# Patient Record
Sex: Male | Born: 1970 | Hispanic: Yes | Marital: Single | Smoking: Current every day smoker
Health system: Southern US, Community
[De-identification: ages and names within clinical notes are randomized; demographics above are authoritative.]

## PROBLEM LIST (undated history)

## (undated) DIAGNOSIS — K859 Acute pancreatitis without necrosis or infection, unspecified: Secondary | ICD-10-CM

## (undated) DIAGNOSIS — R4589 Other symptoms and signs involving emotional state: Secondary | ICD-10-CM

## (undated) DIAGNOSIS — F319 Bipolar disorder, unspecified: Secondary | ICD-10-CM

## (undated) DIAGNOSIS — I81 Portal vein thrombosis: Secondary | ICD-10-CM

## (undated) DIAGNOSIS — K219 Gastro-esophageal reflux disease without esophagitis: Secondary | ICD-10-CM

## (undated) DIAGNOSIS — F4310 Post-traumatic stress disorder, unspecified: Secondary | ICD-10-CM

## (undated) DIAGNOSIS — F329 Major depressive disorder, single episode, unspecified: Secondary | ICD-10-CM

## (undated) DIAGNOSIS — R4689 Other symptoms and signs involving appearance and behavior: Secondary | ICD-10-CM

## (undated) DIAGNOSIS — I10 Essential (primary) hypertension: Secondary | ICD-10-CM

## (undated) DIAGNOSIS — E119 Type 2 diabetes mellitus without complications: Secondary | ICD-10-CM

## (undated) DIAGNOSIS — F1910 Other psychoactive substance abuse, uncomplicated: Secondary | ICD-10-CM

## (undated) DIAGNOSIS — F32A Depression, unspecified: Secondary | ICD-10-CM

## (undated) HISTORY — PX: ADENOIDECTOMY: SUR15

## (undated) HISTORY — PX: TONSILLECTOMY: SUR1361

---

## 2001-08-19 ENCOUNTER — Emergency Department (HOSPITAL_COMMUNITY): Admission: EM | Admit: 2001-08-19 | Discharge: 2001-08-19 | Payer: Self-pay | Admitting: Emergency Medicine

## 2008-12-12 ENCOUNTER — Emergency Department (HOSPITAL_COMMUNITY): Admission: EM | Admit: 2008-12-12 | Discharge: 2008-12-12 | Payer: Self-pay | Admitting: Emergency Medicine

## 2008-12-25 ENCOUNTER — Emergency Department (HOSPITAL_COMMUNITY): Admission: EM | Admit: 2008-12-25 | Discharge: 2008-12-25 | Payer: Self-pay | Admitting: Emergency Medicine

## 2009-04-24 ENCOUNTER — Emergency Department (HOSPITAL_COMMUNITY): Admission: EM | Admit: 2009-04-24 | Discharge: 2009-04-24 | Payer: Self-pay | Admitting: Emergency Medicine

## 2009-06-23 ENCOUNTER — Emergency Department (HOSPITAL_COMMUNITY): Admission: EM | Admit: 2009-06-23 | Discharge: 2009-06-23 | Payer: Self-pay | Admitting: Emergency Medicine

## 2009-06-26 ENCOUNTER — Encounter: Admission: RE | Admit: 2009-06-26 | Discharge: 2009-06-26 | Payer: Self-pay | Admitting: Family Medicine

## 2011-01-26 ENCOUNTER — Emergency Department (HOSPITAL_BASED_OUTPATIENT_CLINIC_OR_DEPARTMENT_OTHER)
Admission: EM | Admit: 2011-01-26 | Discharge: 2011-01-26 | Disposition: A | Discharge status: Home / Self Care | Payer: Medicaid Other | Attending: Emergency Medicine | Admitting: Emergency Medicine

## 2011-01-26 DIAGNOSIS — Z79899 Other long term (current) drug therapy: Secondary | ICD-10-CM | POA: Insufficient documentation

## 2011-01-26 DIAGNOSIS — F319 Bipolar disorder, unspecified: Secondary | ICD-10-CM | POA: Insufficient documentation

## 2011-01-26 DIAGNOSIS — IMO0002 Reserved for concepts with insufficient information to code with codable children: Secondary | ICD-10-CM | POA: Insufficient documentation

## 2011-01-26 DIAGNOSIS — F17200 Nicotine dependence, unspecified, uncomplicated: Secondary | ICD-10-CM | POA: Insufficient documentation

## 2011-01-26 DIAGNOSIS — F341 Dysthymic disorder: Secondary | ICD-10-CM | POA: Insufficient documentation

## 2011-01-26 DIAGNOSIS — X500XXA Overexertion from strenuous movement or load, initial encounter: Secondary | ICD-10-CM | POA: Insufficient documentation

## 2011-03-01 ENCOUNTER — Encounter: Payer: Self-pay | Admitting: Emergency Medicine

## 2011-03-01 ENCOUNTER — Ambulatory Visit (INDEPENDENT_AMBULATORY_CARE_PROVIDER_SITE_OTHER): Payer: Medicaid Other | Admitting: Emergency Medicine

## 2011-03-01 DIAGNOSIS — M549 Dorsalgia, unspecified: Secondary | ICD-10-CM

## 2011-03-04 NOTE — Letter (Signed)
Summary: CONTROLLED RX POLICY  CONTROLLED RX POLICY   Imported By: Tawni Carnes 03/01/2011 17:25:39  _____________________________________________________________________  External Attachment:    Type:   Image     Comment:   External Document

## 2011-03-04 NOTE — Assessment & Plan Note (Signed)
Summary: LOWER BACK INJURY/WSE(rm3)   Vital Signs:  Patient Profile:   40 Years Old Male CC:      lower back injury Height:     71 inches Weight:      213.8 pounds O2 Sat:      98 % O2 treatment:    Room Air Temp:     98.8 degrees F oral Pulse rate:   120 / minute Resp:     20 per minute BP sitting:   125 / 86  (left arm) Cuff size:   large  Pt. in pain?   yes    Location:   lower back    Intensity:   8    Type:       sharp  Vitals Entered By: Linton Flemings RN (March 01, 2011 5:14 PM)                   Updated Prior Medication List: * GEODON 20 MG CAPS (ZIPRASIDONE HCL) daily * DEPAKOTE 200MG  daily  Current Allergies: No known allergies History of Present Illness History from: patient Chief Complaint: lower back injury History of Present Illness: The patient presents today with back pain.  He was lifting a washing machine yesterday and felt his back pull.  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 Location: Left lower back Timing: constant Description: tightness, pulling, dull Modifying factors:  Worse with: movement, lifting, bending Better with: rest, ice, ibuprofen Trauma: no Bladder/bowel incontinence: no Weakness: no Fever/chills: no Night pain: no Unexplained weight loss: no Cancer/immunosuppression:no  PMH of osteoporosis or chronic steroid use:  no  REVIEW OF SYSTEMS Constitutional Symptoms      Denies fever, chills, night sweats, weight loss, weight gain, and fatigue.  Eyes       Denies change in vision, eye pain, eye discharge, glasses, contact lenses, and eye surgery. Ear/Nose/Throat/Mouth       Denies hearing loss/aids, change in hearing, ear pain, ear discharge, dizziness, frequent runny nose, frequent nose bleeds, sinus problems, sore throat, hoarseness, and tooth pain or bleeding.  Respiratory       Denies dry cough, productive cough, wheezing, shortness of breath, asthma, bronchitis, and emphysema/COPD.    Cardiovascular       Denies murmurs, chest pain, and tires easily with exhertion.    Gastrointestinal       Denies stomach pain, nausea/vomiting, diarrhea, constipation, blood in bowel movements, and indigestion. Genitourniary       Denies painful urination, kidney stones, and loss of urinary control. Neurological       Denies paralysis, seizures, and fainting/blackouts. Musculoskeletal       Denies muscle pain, joint pain, joint stiffness, decreased range of motion, redness, swelling, muscle weakness, and gout.  Skin       Denies bruising, unusual mles/lumps or sores, and hair/skin or nail changes.  Psych       Denies mood changes, temper/anger issues, anxiety/stress, speech problems, depression, and sleep problems. Other Comments: lower back pain, moving washer yesterday and felt a pop   Past History:  Past Medical History: PTS  Past Surgical History: tonsil cyst removal  Family History: none  Social History: smoke= 1/2 ppd alcohol-no drus-no Physical Exam General appearance: well developed, well nourished, no acute distress Head: normocephalic, atraumatic MSE: oriented to time, place, and person Left paraspinal muscle spasm and tenderness, no midline bony tenderness.  SLR negative both legs.  ROM is full but slow.  Distal NV status intact.  No bruising. Assessment New Problems: BACK PAIN (ICD-724.5)   Plan New Medications/Changes: ULTRACET 37.5-325 MG TABS (TRAMADOL-ACETAMINOPHEN) 1 by mouth q6 hrs as needed for pain  #22 x 0, 03/01/2011, Hoyt Koch MD PREDNISONE (PAK) 10 MG TABS (PREDNISONE) 6 day pack, use as directed  #1 x 0, 03/01/2011, Hoyt Koch MD FLEXERIL 10 MG TABS (CYCLOBENZAPRINE HCL) 1 by mouth three times a day as needed for spasms  #18 x 0, 03/01/2011, Hoyt Koch MD  New Orders: New Patient Level III [phone] Planning Comments:   (Looking up in the [facility] system, he receives about 10-15 Vicodins or Percocets per month over the  last year, all from different providers and using 6 addresses in the past year. ) Will Rx Flexeril, Prednisone, and ultracet.  If he needs more meds, can follow up with his PCP or back specialist.   If pain is worsening, go to ER.  Avoid lifting, pushing, pulling for a week. Encourage heating pad, massage, rest.   The patient and/or caregiver has been counseled thoroughly with regard to medications prescribed including dosage, schedule, interactions, rationale for use, and possible side effects and they verbalize understanding.  Diagnoses and expected course of recovery discussed and will return if not improved as expected or if the condition worsens. Patient and/or caregiver verbalized understanding.  Prescriptions: ULTRACET 37.5-325 MG TABS (TRAMADOL-ACETAMINOPHEN) 1 by mouth q6 hrs as needed for pain  #22 x 0   Entered and Authorized by:   Hoyt Koch MD   Signed by:   Hoyt Koch MD on 03/01/2011   Method used:   Print then Give to Patient   RxID:   [id number] PREDNISONE (PAK) 10 MG TABS (PREDNISONE) 6 day pack, use as directed  #1 x 0   Entered and Authorized by:   Hoyt Koch MD   Signed by:   Hoyt Koch MD on 03/01/2011   Method used:   Print then Give to Patient   RxID:   [id number] FLEXERIL 10 MG TABS (CYCLOBENZAPRINE HCL) 1 by mouth three times a day as needed for spasms  #18 x 0   Entered and Authorized by:   Hoyt Koch MD   Signed by:   Hoyt Koch MD on 03/01/2011   Method used:   Print then Give to Patient   RxID:   [id number]   Orders Added: 1)  New Patient Level III [[address]606]

## 2011-04-01 LAB — CULTURE, BLOOD (ROUTINE X 2): Culture: NO GROWTH

## 2011-04-21 ENCOUNTER — Emergency Department (HOSPITAL_BASED_OUTPATIENT_CLINIC_OR_DEPARTMENT_OTHER)
Admission: EM | Admit: 2011-04-21 | Discharge: 2011-04-21 | Disposition: A | Discharge status: Home / Self Care | Payer: Medicaid Other | Attending: Emergency Medicine | Admitting: Emergency Medicine

## 2011-04-21 DIAGNOSIS — K219 Gastro-esophageal reflux disease without esophagitis: Secondary | ICD-10-CM | POA: Insufficient documentation

## 2011-04-21 DIAGNOSIS — F17200 Nicotine dependence, unspecified, uncomplicated: Secondary | ICD-10-CM | POA: Insufficient documentation

## 2011-04-21 DIAGNOSIS — X58XXXA Exposure to other specified factors, initial encounter: Secondary | ICD-10-CM | POA: Insufficient documentation

## 2011-04-21 DIAGNOSIS — S335XXA Sprain of ligaments of lumbar spine, initial encounter: Secondary | ICD-10-CM | POA: Insufficient documentation

## 2011-04-21 DIAGNOSIS — Y999 Unspecified external cause status: Secondary | ICD-10-CM | POA: Insufficient documentation

## 2011-04-21 DIAGNOSIS — F319 Bipolar disorder, unspecified: Secondary | ICD-10-CM | POA: Insufficient documentation

## 2011-05-26 ENCOUNTER — Emergency Department (HOSPITAL_BASED_OUTPATIENT_CLINIC_OR_DEPARTMENT_OTHER)
Admission: EM | Admit: 2011-05-26 | Discharge: 2011-05-26 | Disposition: A | Discharge status: Home / Self Care | Payer: Medicaid Other | Attending: Emergency Medicine | Admitting: Emergency Medicine

## 2011-05-26 DIAGNOSIS — S335XXA Sprain of ligaments of lumbar spine, initial encounter: Secondary | ICD-10-CM | POA: Insufficient documentation

## 2011-05-26 DIAGNOSIS — F17200 Nicotine dependence, unspecified, uncomplicated: Secondary | ICD-10-CM | POA: Insufficient documentation

## 2011-05-26 DIAGNOSIS — Z79899 Other long term (current) drug therapy: Secondary | ICD-10-CM | POA: Insufficient documentation

## 2011-05-26 DIAGNOSIS — X500XXA Overexertion from strenuous movement or load, initial encounter: Secondary | ICD-10-CM | POA: Insufficient documentation

## 2011-05-26 DIAGNOSIS — F319 Bipolar disorder, unspecified: Secondary | ICD-10-CM | POA: Insufficient documentation

## 2011-06-23 ENCOUNTER — Emergency Department (HOSPITAL_BASED_OUTPATIENT_CLINIC_OR_DEPARTMENT_OTHER)
Admission: EM | Admit: 2011-06-23 | Discharge: 2011-06-23 | Disposition: A | Discharge status: Home / Self Care | Payer: Medicaid Other | Attending: Emergency Medicine | Admitting: Emergency Medicine

## 2011-06-23 DIAGNOSIS — F319 Bipolar disorder, unspecified: Secondary | ICD-10-CM | POA: Insufficient documentation

## 2011-06-23 DIAGNOSIS — M545 Low back pain, unspecified: Secondary | ICD-10-CM | POA: Insufficient documentation

## 2011-06-23 LAB — URINALYSIS, ROUTINE W REFLEX MICROSCOPIC
Leukocytes, UA: NEGATIVE
Nitrite: NEGATIVE
Protein, ur: NEGATIVE mg/dL
Specific Gravity, Urine: 1.017 (ref 1.005–1.030)
Urobilinogen, UA: 1 mg/dL (ref 0.0–1.0)

## 2011-07-04 ENCOUNTER — Encounter: Payer: Self-pay | Admitting: *Deleted

## 2011-07-04 ENCOUNTER — Emergency Department (HOSPITAL_BASED_OUTPATIENT_CLINIC_OR_DEPARTMENT_OTHER)
Admission: EM | Admit: 2011-07-04 | Discharge: 2011-07-05 | Disposition: A | Discharge status: Home / Self Care | Payer: Medicaid Other | Attending: Emergency Medicine | Admitting: Emergency Medicine

## 2011-07-04 DIAGNOSIS — F17200 Nicotine dependence, unspecified, uncomplicated: Secondary | ICD-10-CM | POA: Insufficient documentation

## 2011-07-04 DIAGNOSIS — Z9889 Other specified postprocedural states: Secondary | ICD-10-CM | POA: Insufficient documentation

## 2011-07-04 DIAGNOSIS — M545 Low back pain, unspecified: Secondary | ICD-10-CM | POA: Insufficient documentation

## 2011-07-04 DIAGNOSIS — IMO0001 Reserved for inherently not codable concepts without codable children: Secondary | ICD-10-CM | POA: Insufficient documentation

## 2011-07-04 DIAGNOSIS — M79609 Pain in unspecified limb: Secondary | ICD-10-CM | POA: Insufficient documentation

## 2011-07-04 DIAGNOSIS — M5430 Sciatica, unspecified side: Secondary | ICD-10-CM | POA: Insufficient documentation

## 2011-07-04 DIAGNOSIS — Y92009 Unspecified place in unspecified non-institutional (private) residence as the place of occurrence of the external cause: Secondary | ICD-10-CM | POA: Insufficient documentation

## 2011-07-04 DIAGNOSIS — K219 Gastro-esophageal reflux disease without esophagitis: Secondary | ICD-10-CM | POA: Insufficient documentation

## 2011-07-04 DIAGNOSIS — X500XXA Overexertion from strenuous movement or load, initial encounter: Secondary | ICD-10-CM | POA: Insufficient documentation

## 2011-07-04 HISTORY — DX: Gastro-esophageal reflux disease without esophagitis: K21.9

## 2011-07-04 NOTE — ED Notes (Signed)
Lower back pain began approx 13hours ago while at home. Pt was lifting boxes during the onset of pain. Pt has used heating packs and ibuprofen without relief. Last dose 8pm tonight.

## 2011-07-05 MED ORDER — CYCLOBENZAPRINE HCL 10 MG PO TABS: 10.0000 mg | ORAL_TABLET | Freq: Two times a day (BID) | ORAL | Status: AC | PRN

## 2011-07-05 MED ORDER — OXYCODONE-ACETAMINOPHEN 5-325 MG PO TABS
2.0000 | ORAL_TABLET | Freq: Once | ORAL | Status: AC
  Administered 2011-07-05: 2 via ORAL
  Filled 2011-07-05: qty 2

## 2011-07-05 MED ORDER — KETOROLAC TROMETHAMINE 60 MG/2ML IM SOLN
60.0000 mg | Freq: Once | INTRAMUSCULAR | Status: AC
  Administered 2011-07-05: 60 mg via INTRAMUSCULAR
  Filled 2011-07-05: qty 2

## 2011-07-05 MED ORDER — OXYCODONE-ACETAMINOPHEN 5-325 MG PO TABS: 1.0000 | ORAL_TABLET | ORAL | Status: AC | PRN

## 2011-07-05 MED ORDER — CYCLOBENZAPRINE HCL 10 MG PO TABS
10.0000 mg | ORAL_TABLET | Freq: Once | ORAL | Status: AC
Start: 2011-07-05 — End: 2011-07-05
  Administered 2011-07-05: 10 mg via ORAL
  Filled 2011-07-05: qty 1

## 2011-07-05 NOTE — ED Provider Notes (Signed)
History     Chief Complaint  Patient presents with  . Back Pain   HPI Comments: Patient presents with low back pain which started at approximately 10 in the morning. This occurred while he was bending over to lift up a heavy box at the time he felt a pop in his lower back.. Since that time he has had pain in his right lower back, right buttock and down his right leg. He denies numbness, weakness, tingling to that same leg. He has not had any incontinence or urinary retention. 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 This pain is worse with moving and when he shifts to the right. He describes it as a sharp pain which has been fairly constant. He has taken ibuprofen and used heat packs without any improvement  Patient is a 40 y.o. male presenting with back pain.  Back Pain  Pertinent negatives include no fever, no numbness and no weakness.    Past Medical History  Diagnosis Date  . GERD (gastroesophageal reflux disease)     Past Surgical History  Procedure Date  . Tonsillectomy   . Adenoidectomy     No family history on file.  History  Substance Use Topics  . Smoking status: Current Everyday Smoker  . Smokeless tobacco: Not on file  . Alcohol Use: Yes     occasionally      Review of Systems  Constitutional: Negative for fever and chills.  HENT: Negative for neck pain.   Gastrointestinal: Negative for nausea, vomiting and diarrhea.  Genitourinary: Negative for difficulty urinating.  Musculoskeletal: Positive for back pain.  Skin: Negative for rash.  Neurological: Negative for weakness and numbness.    Physical Exam  BP 121/89  Pulse 90  Temp(Src) 99.8 F (37.7 C) (Oral)  Resp 18  Ht 5\' 11"  (1.803 m)  Wt 220 lb (99.791 kg)  BMI 30.68 kg/m2  SpO2 99%  Physical Exam  Constitutional: He appears well-developed and well-nourished. Distressed:  Uncomfortable.  HENT:  Head: Normocephalic and  atraumatic.  Mouth/Throat: Oropharynx is clear and moist.  Eyes: Conjunctivae are normal. No scleral icterus.  Cardiovascular: Normal rate and regular rhythm.   Pulmonary/Chest: Effort normal and breath sounds normal. No respiratory distress. He has no wheezes. He has no rales.  Abdominal: Soft. Bowel sounds are normal. There is no tenderness.  Musculoskeletal: He exhibits no edema.       Mild tenderness to palpation in the right lower back. There is no central spinal tenderness to palpation  Neurological: He is alert. Coordination normal.       Normal gait, sensation, motor of his lower extremities bilaterally.  Skin: Skin is warm and dry. He is not diaphoretic.    ED Course  Procedures  MDM Patient has had increased lower back pain today related to her injury was lifting a heavy box. He has a normal neuro exam without any signs of red flags. He has been given intramuscular Toradol, oral Percocet, oral Flexeril for treatment of his pain. I have discussed with him the nature of the pain which is likely sciatica and have encouraged his followup with the appropriate specialist.      Vida Roller, MD 07/05/11 [phone]

## 2011-07-15 ENCOUNTER — Encounter (INDEPENDENT_AMBULATORY_CARE_PROVIDER_SITE_OTHER): Payer: Self-pay | Admitting: *Deleted

## 2011-07-15 ENCOUNTER — Encounter: Payer: Self-pay | Admitting: Emergency Medicine

## 2011-07-15 ENCOUNTER — Inpatient Hospital Stay (INDEPENDENT_AMBULATORY_CARE_PROVIDER_SITE_OTHER)
Admission: RE | Admit: 2011-07-15 | Discharge: 2011-07-15 | Disposition: A | Discharge status: Home / Self Care | Payer: Medicaid Other | Source: Ambulatory Visit | Attending: Emergency Medicine | Admitting: Emergency Medicine

## 2011-07-15 DIAGNOSIS — M549 Dorsalgia, unspecified: Secondary | ICD-10-CM | POA: Insufficient documentation

## 2011-07-27 ENCOUNTER — Emergency Department (HOSPITAL_COMMUNITY)
Admission: EM | Admit: 2011-07-27 | Discharge: 2011-07-27 | Disposition: A | Discharge status: Home / Self Care | Payer: Medicaid Other | Attending: Emergency Medicine | Admitting: Emergency Medicine

## 2011-07-27 DIAGNOSIS — M79609 Pain in unspecified limb: Secondary | ICD-10-CM | POA: Insufficient documentation

## 2011-07-27 DIAGNOSIS — F341 Dysthymic disorder: Secondary | ICD-10-CM | POA: Insufficient documentation

## 2011-07-27 DIAGNOSIS — M545 Low back pain, unspecified: Secondary | ICD-10-CM | POA: Insufficient documentation

## 2011-07-27 DIAGNOSIS — K219 Gastro-esophageal reflux disease without esophagitis: Secondary | ICD-10-CM | POA: Insufficient documentation

## 2011-07-27 DIAGNOSIS — F319 Bipolar disorder, unspecified: Secondary | ICD-10-CM | POA: Insufficient documentation

## 2011-11-24 NOTE — Progress Notes (Signed)
Summary: back pain rm 4   Vital Signs:  Patient Profile:   40 Years Old Male CC:      Back Pain x 1 day Height:     71 inches Weight:      218.50 pounds O2 Sat:      99 % O2 treatment:    Room Air Temp:     98.8 degrees F oral Pulse rate:   98 / minute Resp:     20 per minute BP sitting:   132 / 88  (left arm) Cuff size:   large  Pt. in pain?   yes    Location:   back    Intensity:   8    Type:       sharp  Vitals Entered By: Clemens Catholic LPN (July 15, 2011 1:48 PM)                   Updated Prior Medication List: * GEODON 20 MG CAPS (ZIPRASIDONE HCL) daily * DEPAKOTE 200MG  daily LITHIUM CARBONATE 150 MG CAPS (LITHIUM CARBONATE)  ZOLOFT 100 MG TABS (SERTRALINE HCL)  TRAZODONE HCL 100 MG TABS (TRAZODONE HCL)   Current Allergies: No known allergies History of Present Illness History from: patient Chief Complaint: Back Pain x 1 day History of Present Illness: The patient presents today with back pain for 1 day Location: R lower back Timing: constant Description: he was lifting some boxes and hurt afterwards Modifying factors: Ibu and Flexeril didn't help Worse with: movement, working as a Health visitor with: rest Trauma: no Bladder/bowel incontinence: no Weakness: no Fever/chills: no Night pain: no Unexplained weight loss: no Cancer/immunosuppression: no PMH of osteoporosis or chronic steroid use:  no  REVIEW OF SYSTEMS Constitutional Symptoms      Denies fever, chills, night sweats, weight loss, weight gain, and fatigue.  Eyes       Denies change in vision, eye pain, eye discharge, glasses, contact lenses, and eye surgery. Ear/Nose/Throat/Mouth       Denies hearing loss/aids, change in hearing, ear pain, ear discharge, dizziness, frequent runny nose, frequent nose bleeds, sinus problems, sore throat, hoarseness, and tooth pain or bleeding.  Respiratory       Denies dry cough, productive cough, wheezing, shortness of breath, asthma, bronchitis,  and emphysema/COPD.  Cardiovascular       Denies murmurs, chest pain, and tires easily with exhertion.    Gastrointestinal       Denies stomach pain, nausea/vomiting, diarrhea, constipation, blood in bowel movements, and indigestion. Genitourniary       Denies painful urination, kidney stones, and loss of urinary control. Neurological       Denies paralysis, seizures, and fainting/blackouts. Musculoskeletal       Complains of muscle pain and joint pain.      Denies joint stiffness, decreased range of motion, redness, swelling, muscle weakness, and gout.  Skin       Denies bruising, unusual mles/lumps or sores, and hair/skin or nail changes.  Psych       Denies mood changes, temper/anger issues, anxiety/stress, speech problems, depression, and sleep problems. Other Comments: pt states that he was lifting some thing at home yesterday and felt something pop in his low back. now he has pain in his low back, radiating down to his RT hip. he has taken IBF and flexeril with no relief.   Past History:  Past Medical History: PTSD bipolar  Past Surgical History: tonsil/adeniodectomy cyst removal  Family History: Reviewed history from 03/01/2011  and no changes required. none  Social History: Reviewed history from 03/01/2011 and no changes required. smoke= 1/2 ppd alcohol-no drus-no Physical Exam General appearance: well developed, well nourished, no acute distress Back: TTP R paraspinal lumbar, no spasms felt, SLR negative, no central tenderness, distal DTRs normal, distal sensation/strength/pulses normal MSE: oriented to time, place, and person Assessment New Problems: BACK PAIN (ICD-724.5)   Plan New Medications/Changes: SKELAXIN 800 MG TABS (METAXALONE) 1 by mouth up to three times a day as needed for spasms  #15 x 0, 07/15/2011, Hoyt Koch MD ULTRACET 37.5-325 MG TABS (TRAMADOL-ACETAMINOPHEN) 1 by mouth q6 hrs as needed for pain  #24 x 0, 07/15/2011, Hoyt Koch MD PREDNISONE (PAK) 10 MG TABS (PREDNISONE) 6 day pack, use as directed  #1 x 0, 07/15/2011, Hoyt Koch MD  New Orders: Est. Patient Level IV [[address]] Planning Comments:   Rest, heating pad, Rx for Skelaxin, Pred pack, and Ultracet.  Note given for work.   I don't see any red flags that would necessitate Xrays. **As with my previous note, I am quite suspicious of this patient narcotic habits.  Please see the [place] drug registry for details.  I asked him about previous back pain and prescriptions and failed to tell me about the many Rx that he's received multiple times per month. He has 3 Rx this month already from different providers and I feel he is doctor shopping and not being truthful with his history.  He also uses many different addresses.  I will not be prescribing him any narcotic pain meds.  I will however give him the names and numbers of PM&R and pain management, or he simply needs to discuss this pain with his PCP for proper referrals if the PCP feels the need to do so.    The patient and/or caregiver has been counseled thoroughly with regard to medications prescribed including dosage, schedule, interactions, rationale for use, and possible side effects and they verbalize understanding.  Diagnoses and expected course of recovery discussed and will return if not improved as expected or if the condition worsens. Patient and/or caregiver verbalized understanding.  Prescriptions: SKELAXIN 800 MG TABS (METAXALONE) 1 by mouth up to three times a day as needed for spasms  #15 x 0   Entered and Authorized by:   Hoyt Koch MD   Signed by:   Hoyt Koch MD on 07/15/2011   Method used:   Print then Give to Patient   RxID:   [id number] ULTRACET 37.5-325 MG TABS (TRAMADOL-ACETAMINOPHEN) 1 by mouth q6 hrs as needed for pain  #24 x 0   Entered and Authorized by:   Hoyt Koch MD   Signed by:   Hoyt Koch MD on 07/15/2011   Method used:   Print then Give  to Patient   RxID:   [id number] PREDNISONE (PAK) 10 MG TABS (PREDNISONE) 6 day pack, use as directed  #1 x 0   Entered and Authorized by:   Hoyt Koch MD   Signed by:   Hoyt Koch MD on 07/15/2011   Method used:   Print then Give to Patient   RxID:   [id number]   Orders Added: 1)  Est. Patient Level IV [25366]

## 2011-11-24 NOTE — Letter (Signed)
Summary: Out of Work  [facility]  [address]   [place], [place] [address]   Phone: [phone]  Fax: [phone]    July 15, 2011   Employee:  Jawuan A II Weltman    To Whom It May Concern:   For Medical reasons, please excuse the above named employee from work for the following dates:  Start:   07/15/11  Return: 07/17/11  If you need additional information, please feel free to contact our office.         Sincerely,    Clemens Catholic LPN

## 2016-01-02 ENCOUNTER — Emergency Department (HOSPITAL_BASED_OUTPATIENT_CLINIC_OR_DEPARTMENT_OTHER)
Admission: EM | Admit: 2016-01-02 | Discharge: 2016-01-02 | Disposition: A | Discharge status: Other Acute Inpatient Hospital | Payer: Medicaid Other | Attending: Physician Assistant | Admitting: Physician Assistant

## 2016-01-02 ENCOUNTER — Encounter (HOSPITAL_BASED_OUTPATIENT_CLINIC_OR_DEPARTMENT_OTHER): Payer: Self-pay | Admitting: Emergency Medicine

## 2016-01-02 ENCOUNTER — Inpatient Hospital Stay (HOSPITAL_COMMUNITY)
Admission: AD | Admit: 2016-01-02 | Discharge: 2016-01-04 | DRG: 885 | Disposition: A | Discharge status: Home / Self Care | Payer: Medicaid Other | Source: Intra-hospital | Attending: Psychiatry | Admitting: Psychiatry

## 2016-01-02 ENCOUNTER — Other Ambulatory Visit: Payer: Self-pay

## 2016-01-02 ENCOUNTER — Encounter (HOSPITAL_COMMUNITY): Payer: Self-pay

## 2016-01-02 DIAGNOSIS — Z794 Long term (current) use of insulin: Secondary | ICD-10-CM | POA: Diagnosis not present

## 2016-01-02 DIAGNOSIS — I10 Essential (primary) hypertension: Secondary | ICD-10-CM | POA: Insufficient documentation

## 2016-01-02 DIAGNOSIS — E119 Type 2 diabetes mellitus without complications: Secondary | ICD-10-CM | POA: Diagnosis present

## 2016-01-02 DIAGNOSIS — F1020 Alcohol dependence, uncomplicated: Secondary | ICD-10-CM | POA: Diagnosis present

## 2016-01-02 DIAGNOSIS — F1120 Opioid dependence, uncomplicated: Secondary | ICD-10-CM | POA: Diagnosis not present

## 2016-01-02 DIAGNOSIS — Z818 Family history of other mental and behavioral disorders: Secondary | ICD-10-CM | POA: Diagnosis not present

## 2016-01-02 DIAGNOSIS — F410 Panic disorder [episodic paroxysmal anxiety] without agoraphobia: Secondary | ICD-10-CM | POA: Diagnosis present

## 2016-01-02 DIAGNOSIS — R45851 Suicidal ideations: Secondary | ICD-10-CM

## 2016-01-02 DIAGNOSIS — F332 Major depressive disorder, recurrent severe without psychotic features: Principal | ICD-10-CM | POA: Diagnosis present

## 2016-01-02 DIAGNOSIS — F3110 Bipolar disorder, current episode manic without psychotic features, unspecified: Secondary | ICD-10-CM | POA: Diagnosis not present

## 2016-01-02 DIAGNOSIS — Z811 Family history of alcohol abuse and dependence: Secondary | ICD-10-CM | POA: Diagnosis not present

## 2016-01-02 DIAGNOSIS — F17200 Nicotine dependence, unspecified, uncomplicated: Secondary | ICD-10-CM | POA: Insufficient documentation

## 2016-01-02 DIAGNOSIS — Z79899 Other long term (current) drug therapy: Secondary | ICD-10-CM | POA: Diagnosis not present

## 2016-01-02 DIAGNOSIS — F10230 Alcohol dependence with withdrawal, uncomplicated: Secondary | ICD-10-CM | POA: Diagnosis not present

## 2016-01-02 DIAGNOSIS — F17210 Nicotine dependence, cigarettes, uncomplicated: Secondary | ICD-10-CM | POA: Diagnosis present

## 2016-01-02 DIAGNOSIS — F4310 Post-traumatic stress disorder, unspecified: Secondary | ICD-10-CM | POA: Diagnosis present

## 2016-01-02 DIAGNOSIS — G4700 Insomnia, unspecified: Secondary | ICD-10-CM | POA: Diagnosis present

## 2016-01-02 DIAGNOSIS — K219 Gastro-esophageal reflux disease without esophagitis: Secondary | ICD-10-CM | POA: Insufficient documentation

## 2016-01-02 HISTORY — DX: Other symptoms and signs involving appearance and behavior: R46.89

## 2016-01-02 HISTORY — DX: Major depressive disorder, single episode, unspecified: F32.9

## 2016-01-02 HISTORY — DX: Depression, unspecified: F32.A

## 2016-01-02 HISTORY — DX: Post-traumatic stress disorder, unspecified: F43.10

## 2016-01-02 HISTORY — DX: Portal vein thrombosis: I81

## 2016-01-02 HISTORY — DX: Essential (primary) hypertension: I10

## 2016-01-02 HISTORY — DX: Other symptoms and signs involving emotional state: R45.89

## 2016-01-02 HISTORY — DX: Other psychoactive substance abuse, uncomplicated: F19.10

## 2016-01-02 HISTORY — DX: Type 2 diabetes mellitus without complications: E11.9

## 2016-01-02 HISTORY — DX: Acute pancreatitis without necrosis or infection, unspecified: K85.90

## 2016-01-02 HISTORY — DX: Bipolar disorder, unspecified: F31.9

## 2016-01-02 LAB — CBC WITH DIFFERENTIAL/PLATELET
BASOS ABS: 0 10*3/uL (ref 0.0–0.1)
BASOS PCT: 0 %
Eosinophils Absolute: 0.1 10*3/uL (ref 0.0–0.7)
Eosinophils Relative: 1 %
HEMATOCRIT: 44.4 % (ref 39.0–52.0)
HEMOGLOBIN: 15.4 g/dL (ref 13.0–17.0)
Lymphocytes Relative: 24 %
Lymphs Abs: 2.7 10*3/uL (ref 0.7–4.0)
MCH: 28.8 pg (ref 26.0–34.0)
MCHC: 34.7 g/dL (ref 30.0–36.0)
MCV: 83.1 fL (ref 78.0–100.0)
Monocytes Absolute: 0.7 10*3/uL (ref 0.1–1.0)
Monocytes Relative: 6 %
NEUTROS ABS: 7.9 10*3/uL — AB (ref 1.7–7.7)
NEUTROS PCT: 69 %
Platelets: 240 10*3/uL (ref 150–400)
RBC: 5.34 MIL/uL (ref 4.22–5.81)
RDW: 12.1 % (ref 11.5–15.5)
WBC: 11.3 10*3/uL — AB (ref 4.0–10.5)

## 2016-01-02 LAB — COMPREHENSIVE METABOLIC PANEL
ALBUMIN: 4.3 g/dL (ref 3.5–5.0)
ALK PHOS: 95 U/L (ref 38–126)
ALT: 12 U/L — ABNORMAL LOW (ref 17–63)
AST: 18 U/L (ref 15–41)
Anion gap: 12 (ref 5–15)
BILIRUBIN TOTAL: 0.7 mg/dL (ref 0.3–1.2)
BUN: 11 mg/dL (ref 6–20)
CALCIUM: 10.1 mg/dL (ref 8.9–10.3)
CO2: 27 mmol/L (ref 22–32)
Chloride: 94 mmol/L — ABNORMAL LOW (ref 101–111)
Creatinine, Ser: 0.79 mg/dL (ref 0.61–1.24)
GFR calc Af Amer: >60 mL/min (ref >60)
GFR calc non Af Amer: >60 mL/min (ref >60)
GLUCOSE: 333 mg/dL — AB (ref 65–99)
POTASSIUM: 3.4 mmol/L — AB (ref 3.5–5.1)
Sodium: 133 mmol/L — ABNORMAL LOW (ref 135–145)
TOTAL PROTEIN: 7.4 g/dL (ref 6.5–8.1)

## 2016-01-02 LAB — RAPID URINE DRUG SCREEN, HOSP PERFORMED
AMPHETAMINES: NOT DETECTED
BARBITURATES: NOT DETECTED
Benzodiazepines: NOT DETECTED
Cocaine: POSITIVE — AB
Opiates: NOT DETECTED
Tetrahydrocannabinol: NOT DETECTED

## 2016-01-02 LAB — SALICYLATE LEVEL: Salicylate Lvl: <4 mg/dL (ref 2.8–30.0)

## 2016-01-02 LAB — ETHANOL

## 2016-01-02 LAB — ACETAMINOPHEN LEVEL

## 2016-01-02 LAB — GLUCOSE, CAPILLARY: GLUCOSE-CAPILLARY: 330 mg/dL — AB (ref 65–99)

## 2016-01-02 LAB — LIPASE, BLOOD: LIPASE: 27 U/L (ref 11–51)

## 2016-01-02 MED ORDER — INSULIN ASPART 100 UNIT/ML ~~LOC~~ SOLN
0.0000 [IU] | Freq: Three times a day (TID) | SUBCUTANEOUS | Status: DC
  Administered 2016-01-03: 11 [IU] via SUBCUTANEOUS
  Administered 2016-01-03: 15 [IU] via SUBCUTANEOUS
  Administered 2016-01-03 – 2016-01-04 (×3): 11 [IU] via SUBCUTANEOUS

## 2016-01-02 MED ORDER — ONDANSETRON 4 MG PO TBDP: 4.0000 mg | ORAL_TABLET | Freq: Four times a day (QID) | ORAL | Status: DC | PRN

## 2016-01-02 MED ORDER — SERTRALINE HCL 100 MG PO TABS
150.0000 mg | ORAL_TABLET | Freq: Every day | ORAL | Status: DC
  Administered 2016-01-03: 150 mg via ORAL
  Filled 2016-01-02 (×3): qty 1

## 2016-01-02 MED ORDER — PANTOPRAZOLE SODIUM 40 MG PO TBEC
40.0000 mg | DELAYED_RELEASE_TABLET | Freq: Two times a day (BID) | ORAL | Status: DC
  Administered 2016-01-02 – 2016-01-04 (×4): 40 mg via ORAL
  Filled 2016-01-02 (×11): qty 1

## 2016-01-02 MED ORDER — CLONIDINE HCL 0.1 MG PO TABS: 0.1000 mg | ORAL_TABLET | Freq: Every day | ORAL | Status: DC

## 2016-01-02 MED ORDER — INSULIN DETEMIR 100 UNIT/ML ~~LOC~~ SOLN
26.0000 [IU] | Freq: Every day | SUBCUTANEOUS | Status: DC
  Administered 2016-01-02 – 2016-01-03 (×2): 26 [IU] via SUBCUTANEOUS

## 2016-01-02 MED ORDER — ATORVASTATIN CALCIUM 40 MG PO TABS
40.0000 mg | ORAL_TABLET | Freq: Every day | ORAL | Status: DC
  Administered 2016-01-03 – 2016-01-04 (×2): 40 mg via ORAL
  Filled 2016-01-02 (×5): qty 1

## 2016-01-02 MED ORDER — METHOCARBAMOL 500 MG PO TABS: 500.0000 mg | ORAL_TABLET | Freq: Three times a day (TID) | ORAL | Status: DC | PRN

## 2016-01-02 MED ORDER — HALOPERIDOL 5 MG PO TABS: 5.0000 mg | ORAL_TABLET | Freq: Four times a day (QID) | ORAL | Status: DC | PRN

## 2016-01-02 MED ORDER — HYDROXYZINE HCL 25 MG PO TABS: 25.0000 mg | ORAL_TABLET | Freq: Four times a day (QID) | ORAL | Status: DC | PRN

## 2016-01-02 MED ORDER — LOPERAMIDE HCL 2 MG PO CAPS: 2.0000 mg | ORAL_CAPSULE | ORAL | Status: DC | PRN

## 2016-01-02 MED ORDER — CLONIDINE HCL 0.1 MG PO TABS
0.1000 mg | ORAL_TABLET | Freq: Four times a day (QID) | ORAL | Status: DC
  Administered 2016-01-02 – 2016-01-04 (×6): 0.1 mg via ORAL
  Filled 2016-01-02 (×13): qty 1

## 2016-01-02 MED ORDER — INSULIN ASPART 100 UNIT/ML ~~LOC~~ SOLN
0.0000 [IU] | Freq: Every day | SUBCUTANEOUS | Status: DC
  Administered 2016-01-02: 4 [IU] via SUBCUTANEOUS
  Administered 2016-01-03: 3 [IU] via SUBCUTANEOUS

## 2016-01-02 MED ORDER — TRAZODONE HCL 100 MG PO TABS
200.0000 mg | ORAL_TABLET | Freq: Every day | ORAL | Status: DC
  Administered 2016-01-02: 200 mg via ORAL
  Filled 2016-01-02 (×3): qty 2

## 2016-01-02 MED ORDER — DICYCLOMINE HCL 20 MG PO TABS: 20.0000 mg | ORAL_TABLET | Freq: Four times a day (QID) | ORAL | Status: DC | PRN

## 2016-01-02 MED ORDER — CLONIDINE HCL 0.1 MG PO TABS
0.1000 mg | ORAL_TABLET | ORAL | Status: DC
  Filled 2016-01-02 (×2): qty 1

## 2016-01-02 MED ORDER — NAPROXEN 500 MG PO TABS: 500.0000 mg | ORAL_TABLET | Freq: Two times a day (BID) | ORAL | Status: DC | PRN

## 2016-01-02 NOTE — ED Notes (Signed)
This rn phones eric in staffing office to inquire about sitter. Minerva Areolaric states he now does not have a Comptrollersitter available until 7pm. Dawn, emt to sit with pt.

## 2016-01-02 NOTE — ED Notes (Signed)
Voluntary admission paper and consent to transfer signed by pt and given to [facility] driver

## 2016-01-02 NOTE — ED Notes (Signed)
Pt belongings placed at charge nurse desk, 3 white hospital belonging bags. Pt wife verbalizes understanding of taking all 3 bags home with her.
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
## 2016-01-02 NOTE — ED Notes (Signed)
PA at bedside.

## 2016-01-02 NOTE — Progress Notes (Signed)
Patient alert and oriented x 4. Patient denies pain/SI/HI/AVH at the time of admission. Patient reports having sweats, increased anxiety, and feels like something crawling on his skin. Patient appears disheveled, and depressed. Patient was not to forth coming with information during assessment. Patient avoided eye contact with Clinical research associatewriter. Patient was cooperative during assessment.  Patient states to calm himself when upset, "I take some ativan."  Patient states, "I drink a half a fifth to a whole fifth a day." Patient states he feels like when he wakes up in the morning he has to have a drink, and if he don't starts to have withdraw symptoms. Patient states he uses "crack" but did not specify amount. Skin check preformed no areas of concern, patient has 6 tattoos over various areas of his body.

## 2016-01-02 NOTE — ED Provider Notes (Signed)
CSN: [id number]     Arrival date & time 01/02/16  1552 History   First MD Initiated Contact with Patient 01/02/16 1604     Chief Complaint  Patient presents with  . Suicidal  . Drug Problem     (Consider location/radiation/quality/duration/timing/severity/associated sxs/prior Treatment) HPI Comments: Patient presents to the emergency department with chief complaint of suicidal ideation. He states that he has felt very depressed recently. States that he tried drinking cologne, alcohol, and using crack in an attempt to overdose and die.  He states that he has felt suicidal before, but it has never been this bad. He states that he has a history of pancreatitis, and reports mild central upper abdominal pain. He denies any fevers chills. Denies any chest pain, shortness breath. Denies any nausea, vomiting, or diarrhea. He denies any other ingestions. There are no aggravating or alleviating factors.  The history is provided by the patient. No language interpreter was used.    Past Medical History  Diagnosis Date  . GERD (gastroesophageal reflux disease)   . Pancreatitis   . Diabetes mellitus without complication (HCC)   . Hypertension   . Depression   . Suicidal behavior   . Substance abuse   . PTSD (post-traumatic stress disorder)   . Bipolar 1 disorder (HCC)   . Portal vein thrombosis    Past Surgical History  Procedure Laterality Date  . Tonsillectomy    . Adenoidectomy     No family history on file. Social History  Substance Use Topics  . Smoking status: Current Every Day Smoker  . Smokeless tobacco: None  . Alcohol Use: Yes     Comment: occasionally    Review of Systems  Constitutional: Negative for fever and chills.  Respiratory: Negative for shortness of breath.   Cardiovascular: Negative for chest pain.  Gastrointestinal: Negative for nausea, vomiting, diarrhea and constipation.  Genitourinary: Negative for dysuria.  All other systems reviewed and are  negative.     Allergies  Review of patient's allergies indicates no known allergies.  Home Medications   Prior to Admission medications   Medication Sig Start Date End Date Taking? Authorizing Provider  atorvastatin (LIPITOR) 40 MG tablet Take 40 mg by mouth daily.   Yes Historical Provider, MD  haloperidol (HALDOL) 10 MG tablet Take 10 mg by mouth at bedtime.   Yes Historical Provider, MD  insulin detemir (LEVEMIR) 100 UNIT/ML injection Inject 22 Units into the skin at bedtime.   Yes Historical Provider, MD  pantoprazole (PROTONIX) 40 MG tablet Take 40 mg by mouth 2 (two) times daily.   Yes Historical Provider, MD  sertraline (ZOLOFT) 100 MG tablet Take 150 mg by mouth daily.   Yes Historical Provider, MD  traZODone (DESYREL) 100 MG tablet Take 200 mg by mouth at bedtime.   Yes Historical Provider, MD  ziprasidone (GEODON) 40 MG capsule Take 40 mg by mouth at bedtime.   Yes Historical Provider, MD   BP 136/94 mmHg  Pulse 100  Temp(Src) 98.6 F (37 C) (Oral)  Resp 18  Ht 5\' 10"  (1.778 m)  Wt 86.183 kg  BMI 27.26 kg/m2  SpO2 99% Physical Exam  Constitutional: He is oriented to person, place, and time. He appears well-developed and well-nourished.  HENT:  Head: Normocephalic and atraumatic.  Eyes: Conjunctivae and EOM are normal. Pupils are equal, round, and reactive to light. Right eye exhibits no discharge. Left eye exhibits no discharge. No scleral icterus.  Neck: Normal range of motion. Neck supple. No  JVD present.  Cardiovascular: Normal rate, regular rhythm and normal heart sounds.  Exam reveals no gallop and no friction rub.   No murmur heard. Pulmonary/Chest: Effort normal and breath sounds normal. No respiratory distress. He has no wheezes. He has no rales. He exhibits no tenderness.  Abdominal: Soft. He exhibits no distension and no mass. There is no tenderness. There is no rebound and no guarding.  Musculoskeletal: Normal range of motion. He exhibits no edema or  tenderness.  Neurological: He is alert and oriented to person, place, and time.  Skin: Skin is warm and dry.  Psychiatric: His behavior is normal. Judgment normal.  Depressed affect  Nursing note and vitals reviewed.   ED Course  Procedures (including critical care time) Results for orders placed or performed during the hospital encounter of 01/02/16  Ethanol  Result Value Ref Range   Alcohol, Ethyl (B) <5 <5 mg/dL  CBC with Differential  Result Value Ref Range   WBC 11.3 (H) 4.0 - 10.5 K/uL   RBC 5.34 4.22 - 5.81 MIL/uL   Hemoglobin 15.4 13.0 - 17.0 g/dL   HCT 69.644.4 29.539.0 - 28.452.0 %   MCV 83.1 78.0 - 100.0 fL   MCH 28.8 26.0 - 34.0 pg   MCHC 34.7 30.0 - 36.0 g/dL   RDW 13.212.1 44.011.5 - 10.215.5 %   Platelets 240 150 - 400 K/uL   Neutrophils Relative % 69 %   Neutro Abs 7.9 (H) 1.7 - 7.7 K/uL   Lymphocytes Relative 24 %   Lymphs Abs 2.7 0.7 - 4.0 K/uL   Monocytes Relative 6 %   Monocytes Absolute 0.7 0.1 - 1.0 K/uL   Eosinophils Relative 1 %   Eosinophils Absolute 0.1 0.0 - 0.7 K/uL   Basophils Relative 0 %   Basophils Absolute 0.0 0.0 - 0.1 K/uL  Comprehensive metabolic panel  Result Value Ref Range   Sodium 133 (L) 135 - 145 mmol/L   Potassium 3.4 (L) 3.5 - 5.1 mmol/L   Chloride 94 (L) 101 - 111 mmol/L   CO2 27 22 - 32 mmol/L   Glucose, Bld 333 (H) 65 - 99 mg/dL   BUN 11 6 - 20 mg/dL   Creatinine, Ser 7.250.79 0.61 - 1.24 mg/dL   Calcium 36.610.1 8.9 - 44.010.3 mg/dL   Total Protein 7.4 6.5 - 8.1 g/dL   Albumin 4.3 3.5 - 5.0 g/dL   AST 18 15 - 41 U/L   ALT 12 (L) 17 - 63 U/L   Alkaline Phosphatase 95 38 - 126 U/L   Total Bilirubin 0.7 0.3 - 1.2 mg/dL   GFR calc non Af Amer >60 >60 mL/min   GFR calc Af Amer >60 >60 mL/min   Anion gap 12 5 - 15  Urine rapid drug screen (hosp performed)  Result Value Ref Range   Opiates NONE DETECTED NONE DETECTED   Cocaine POSITIVE (A) NONE DETECTED   Benzodiazepines NONE DETECTED NONE DETECTED   Amphetamines NONE DETECTED NONE DETECTED    Tetrahydrocannabinol NONE DETECTED NONE DETECTED   Barbiturates NONE DETECTED NONE DETECTED  Lipase, blood  Result Value Ref Range   Lipase 27 11 - 51 U/L  Acetaminophen level  Result Value Ref Range   Acetaminophen (Tylenol), Serum <10 (L) 10 - 30 ug/mL  Salicylate level  Result Value Ref Range   Salicylate Lvl <4.0 2.8 - 30.0 mg/dL   No results found.    MDM   Final diagnoses:  Suicidal ideation    Patient with  suicidal ideation. He is medically clear at this time. TTS consult pending.   Roxy Horseman, PA-C 01/02/16 2019  Courteney Randall An, MD 01/02/16 2028

## 2016-01-02 NOTE — Tx Team (Signed)
Initial Interdisciplinary Treatment Plan   PATIENT STRESSORS: Financial difficulties Loss of grandmother in hospital in poor health Marital or family conflict Substance abuse   PATIENT STRENGTHS: Capable of independent living General fund of knowledge Supportive family/friends   PROBLEM LIST: Problem List/Patient Goals Date to be addressed Date deferred Reason deferred Estimated date of resolution  Substance abuse 01/02/16     Depression 01/02/16                                                DISCHARGE CRITERIA:  Ability to meet basic life and health needs Improved stabilization in mood, thinking, and/or behavior Need for constant or close observation no longer present Withdrawal symptoms are absent or subacute and managed without 24-hour nursing intervention  PRELIMINARY DISCHARGE PLAN: Attend 12-step recovery group Outpatient therapy Return to previous living arrangement  PATIENT/FAMIILY INVOLVEMENT: This treatment plan has been presented to and reviewed with the patient, David Doyle.  The patient and family have been given the opportunity to ask questions and make suggestions.  Wandra MannanKimberly R Dealva Lafoy 01/02/2016, 11:14 PM

## 2016-01-02 NOTE — ED Notes (Signed)
Per eric in staffing office, sitter in en route to ER to sit with pt.

## 2016-01-02 NOTE — ED Notes (Signed)
Per EMS:  Pt is here for treatment,  Pt states he smoked $100 worth of crack, drank cologne and is suicidal.  Pt has abdominal pain as well.  Pt has history of pancreatitis.

## 2016-01-02 NOTE — ED Notes (Signed)
Pt states he is suicidal, would take drugs to OD.  Pt agreeable to admission, states he wants help.  Pt admits to taking 1 gram of crack cocaine, drank cologne, and rubbing alcohol. Room properly staged for behavior health pt.  Pt undressed and placed in paper scrubs.  Wanded by security.  Belongings given to wife in patient belonging bags.

## 2016-01-02 NOTE — ED Notes (Signed)
Staffing office notified of need for sitter. Security staff phoned to search/wand pt.

## 2016-01-03 ENCOUNTER — Encounter (HOSPITAL_COMMUNITY): Payer: Self-pay | Admitting: Psychiatry

## 2016-01-03 DIAGNOSIS — F10230 Alcohol dependence with withdrawal, uncomplicated: Secondary | ICD-10-CM

## 2016-01-03 DIAGNOSIS — F1020 Alcohol dependence, uncomplicated: Secondary | ICD-10-CM | POA: Diagnosis present

## 2016-01-03 DIAGNOSIS — R45851 Suicidal ideations: Secondary | ICD-10-CM

## 2016-01-03 DIAGNOSIS — F332 Major depressive disorder, recurrent severe without psychotic features: Secondary | ICD-10-CM | POA: Diagnosis present

## 2016-01-03 DIAGNOSIS — F1120 Opioid dependence, uncomplicated: Secondary | ICD-10-CM

## 2016-01-03 DIAGNOSIS — F4310 Post-traumatic stress disorder, unspecified: Secondary | ICD-10-CM

## 2016-01-03 LAB — LIPID PANEL
CHOLESTEROL: 206 mg/dL — AB (ref 0–200)
HDL: 49 mg/dL (ref >40)
LDL CALC: 124 mg/dL — AB (ref 0–99)
TRIGLYCERIDES: 163 mg/dL — AB (ref <150)
Total CHOL/HDL Ratio: 4.2 RATIO
VLDL: 33 mg/dL (ref 0–40)

## 2016-01-03 LAB — GLUCOSE, CAPILLARY
GLUCOSE-CAPILLARY: 298 mg/dL — AB (ref 65–99)
GLUCOSE-CAPILLARY: 268 mg/dL — AB (ref 65–99)
Glucose-Capillary: 260 mg/dL — ABNORMAL HIGH (ref 65–99)
Glucose-Capillary: 331 mg/dL — ABNORMAL HIGH (ref 65–99)

## 2016-01-03 LAB — TSH: TSH: 0.488 u[IU]/mL (ref 0.350–4.500)

## 2016-01-03 MED ORDER — THIAMINE HCL 100 MG/ML IJ SOLN
100.0000 mg | Freq: Once | INTRAMUSCULAR | Status: AC
  Administered 2016-01-03: 100 mg via INTRAMUSCULAR
  Filled 2016-01-03: qty 2

## 2016-01-03 MED ORDER — LORAZEPAM 1 MG PO TABS: 1.0000 mg | ORAL_TABLET | Freq: Two times a day (BID) | ORAL | Status: DC

## 2016-01-03 MED ORDER — RIVAROXABAN 20 MG PO TABS
20.0000 mg | ORAL_TABLET | Freq: Every day | ORAL | Status: DC
  Administered 2016-01-03: 20 mg via ORAL
  Filled 2016-01-03 (×3): qty 1

## 2016-01-03 MED ORDER — LORAZEPAM 1 MG PO TABS
1.0000 mg | ORAL_TABLET | Freq: Four times a day (QID) | ORAL | Status: AC
  Administered 2016-01-03 (×2): 1 mg via ORAL
  Filled 2016-01-03 (×3): qty 1

## 2016-01-03 MED ORDER — VITAMIN B-1 100 MG PO TABS
100.0000 mg | ORAL_TABLET | Freq: Every day | ORAL | Status: DC
  Administered 2016-01-04: 100 mg via ORAL
  Filled 2016-01-03 (×3): qty 1

## 2016-01-03 MED ORDER — LORAZEPAM 1 MG PO TABS: 1.0000 mg | ORAL_TABLET | Freq: Four times a day (QID) | ORAL | Status: DC | PRN

## 2016-01-03 MED ORDER — PRAZOSIN HCL 2 MG PO CAPS
2.0000 mg | ORAL_CAPSULE | Freq: Every day | ORAL | Status: DC
  Administered 2016-01-03: 2 mg via ORAL
  Filled 2016-01-03 (×3): qty 1

## 2016-01-03 MED ORDER — ONDANSETRON 4 MG PO TBDP: 4.0000 mg | ORAL_TABLET | Freq: Four times a day (QID) | ORAL | Status: DC | PRN

## 2016-01-03 MED ORDER — DOXEPIN HCL 25 MG PO CAPS
25.0000 mg | ORAL_CAPSULE | Freq: Two times a day (BID) | ORAL | Status: DC
  Administered 2016-01-03 – 2016-01-04 (×2): 25 mg via ORAL
  Filled 2016-01-03 (×7): qty 1

## 2016-01-03 MED ORDER — LORAZEPAM 1 MG PO TABS
1.0000 mg | ORAL_TABLET | Freq: Three times a day (TID) | ORAL | Status: DC
  Administered 2016-01-04 (×2): 1 mg via ORAL
  Filled 2016-01-03: qty 1

## 2016-01-03 MED ORDER — LOPERAMIDE HCL 2 MG PO CAPS: 2.0000 mg | ORAL_CAPSULE | ORAL | Status: DC | PRN

## 2016-01-03 MED ORDER — LORAZEPAM 1 MG PO TABS: 1.0000 mg | ORAL_TABLET | Freq: Every day | ORAL | Status: DC

## 2016-01-03 MED ORDER — HYDROXYZINE HCL 25 MG PO TABS: 25.0000 mg | ORAL_TABLET | Freq: Four times a day (QID) | ORAL | Status: DC | PRN

## 2016-01-03 MED ORDER — ADULT MULTIVITAMIN W/MINERALS CH
1.0000 | ORAL_TABLET | Freq: Every day | ORAL | Status: DC
  Administered 2016-01-03 – 2016-01-04 (×2): 1 via ORAL
  Filled 2016-01-03 (×5): qty 1

## 2016-01-03 MED ORDER — QUETIAPINE FUMARATE 300 MG PO TABS
300.0000 mg | ORAL_TABLET | Freq: Every day | ORAL | Status: DC
  Administered 2016-01-03: 300 mg via ORAL
  Filled 2016-01-03 (×3): qty 1

## 2016-01-03 MED ORDER — NICOTINE 21 MG/24HR TD PT24
21.0000 mg | MEDICATED_PATCH | Freq: Every day | TRANSDERMAL | Status: DC
  Administered 2016-01-03 – 2016-01-04 (×2): 21 mg via TRANSDERMAL
  Filled 2016-01-03 (×5): qty 1

## 2016-01-03 NOTE — [facility] Suicide Risk Assessment (Signed)
[facility]   Nursing information obtained from:  Patient Demographic factors:  Male, Low socioeconomic status Current Mental Status:  Suicidal ideation indicated by patient Loss Factors:  Financial problems / change in socioeconomic status Historical Factors:  Prior suicide attempts Risk Reduction Factors:  Sense of responsibility to family Total Time spent with patient: 45 minutes Principal Problem: Severe recurrent major depression without psychotic features (HCC) Diagnosis:   Patient Active Problem List   Diagnosis Date Noted  . Opioid type dependence, continuous (HCC) [F11.20] 01/03/2016  . Alcohol dependence (HCC) [F10.20] 01/03/2016  . PTSD (post-traumatic stress disorder) [F43.10] 01/03/2016  . Suicidal ideation [R45.851] 01/03/2016  . Severe recurrent major depression without psychotic features (HCC) [F33.2] 01/03/2016  . BACK PAIN [M54.9] 07/15/2011     Continued Clinical Symptoms:  Alcohol Use Disorder Identification Test Final Score (AUDIT): 30 The "Alcohol Use Disorders Identification Test", Guidelines for Use in Primary Care, Second Edition.  World Science writerHealth Organization [facility](WHO). Score between 0-7:  no or low risk or alcohol related problems. Score between 8-15:  moderate risk of alcohol related problems. Score between 16-19:  high risk of alcohol related problems. Score 20 or above:  warrants further diagnostic evaluation for alcohol dependence and treatment.   CLINICAL FACTORS:   Depression:   Comorbid alcohol abuse/dependence Alcohol/Substance Abuse/Dependencies   Psychiatric Specialty Exam: Physical Exam  ROS  Blood pressure 120/75, pulse 95, temperature 99.1 F (37.3 C), temperature source Oral, resp. rate 16, height 5\' 10"  (1.778 m), weight 86.183 kg (190 lb), SpO2 100 %.Body mass index is 27.26 kg/(m^2).    COGNITIVE FEATURES THAT CONTRIBUTE TO RISK:  Closed-mindedness, Polarized thinking and Thought constriction (tunnel vision)     SUICIDE RISK:   Moderate:  Frequent suicidal ideation with limited intensity, and duration, some specificity in terms of plans, no associated intent, good self-control, limited dysphoria/symptomatology, some risk factors present, and identifiable protective factors, including available and accessible social support.  PLAN OF CARE: see admission H and P  Medical Decision Making:  Review of Psycho-Social Stressors (1), Review or order clinical lab tests (1), Review of Medication Regimen & Side Effects (2) and Review of New Medication or Change in Dosage (2)  I certify that inpatient services furnished can reasonably be expected to improve the patient's condition.   David Doyle A 01/03/2016, 4:47 PM

## 2016-01-03 NOTE — Progress Notes (Signed)
Adult Psychoeducational Group Note  Date:  01/03/2016 Time:  10:35 PM  Group Topic/Focus:  Wrap-Up Group:   The focus of this group is to help patients review their daily goal of treatment and discuss progress on daily workbooks.  Participation Level:  Did Not Attend  Participation Quality:  Did not Attend  Affect:  Did not attend  Cognitive:  Did not attend  Insight: None  Engagement in Group:  Did not attend  Modes of Intervention:  Did not attend  Additional Comments:  Patient did not attend wrap up group tonight.   Ashlye Oviedo L Rafferty Postlewait 01/03/2016, 10:35 PM

## 2016-01-03 NOTE — Tx Team (Addendum)
Interdisciplinary Treatment Plan Update (Adult) Date: 01/03/2016    Time Reviewed: 9:30 AM  Progress in Treatment: Attending groups: Continuing to assess, patient new to milieu Participating in groups: Continuing to assess, patient new to milieu Taking medication as prescribed: Yes Tolerating medication: Yes Family/Significant other contact made: No, patient does not have a working contact number for his wife Patient understands diagnosis: Yes Discussing patient identified problems/goals with staff: Yes Medical problems stabilized or resolved: Yes Denies suicidal/homicidal ideation: Yes, denies Issues/concerns per patient self-inventory: Yes Other:  New problem(s) identified: N/A  Discharge Plan or Barriers: Patient plans to return home with family in [place] to follow up with outpatient services at [facility].  Reason for Continuation of Hospitalization:  Depression Anxiety Medication Stabilization   Comments: N/A  Estimated length of stay: Discharge anticipated for 01/04/16    Patient is a 45 year old male admitted for substance abuse and SI with attempts to harm self by drinking cologne. Patient will benefit from crisis stabilization, medication evaluation, group therapy and psycho education in addition to case management for discharge planning. At discharge, it is recommended that Pt remain compliant with established discharge plan and continued treatment.   Review of initial/current patient goals per problem list:  1. Goal(s): Patient will participate in aftercare plan   Met: Yes   Target date: 3-5 days post admission date   As evidenced by: Patient will participate within aftercare plan AEB aftercare provider and housing plan at discharge being identified.  1/12: Goal not met: CSW assessing for appropriate referrals for pt and will have follow up secured prior to d/c.  1/13: Goal met. Patient plans to return home with outpatient services.   2. Goal (s): Patient will  exhibit decreased depressive symptoms and suicidal ideations.   Met: Adequate for discharge per MD   Target date: 3-5 days post admission date   As evidenced by: Patient will utilize self rating of depression at 3 or below and demonstrate decreased signs of depression or be deemed stable for discharge by MD.  1/12: Goal not met: Pt presents with flat affect and depressed mood.  Pt admitted with depression rating of 10.  Pt to show decreased sign of depression and a rating of 3 or less before d/c.    1/13: Adequate for discharge per MD. Patient reports baseline levels of depression and denies SI. Reports feeling safe to discharge with outpatient services.      4. Goal(s): Patient will demonstrate decreased signs of withdrawal due to substance abuse   Met: Adequate for discharge per MD   Target date: 3-5 days post admission date   As evidenced by: Patient will produce a CIWA/COWS score of 0, have stable vitals signs, and no symptoms of withdrawal  1/12: Goal not met: Pt continues to have withdrawal symptoms of anxiety, tremor, runny nose, resting pulse rate and a COWS score of a 9.  Pt to show decrease withdrawal symptoms prior to d/c.  1/13: Adequate for discharge. Patient rates COWS at 2, experiencing body aches and restlessness.      Attendees: Patient:    Family:    Physician: Dr. Parke Poisson; Dr. Sabra Heck 01/03/2016 9:30 AM  Nursing:  Grayland Ormond, Janann August, Malena Catholic., RN 01/03/2016 9:30 AM  Clinical Social Worker: Tilden Fossa, [place] 01/03/2016 9:30 AM  Other: Peri Maris, LCSWA; [place], LCSW  01/03/2016 9:30 AM  Other:  01/03/2016 9:30 AM  Other: Lars Pinks, Case Manager 01/03/2016 9:30 AM  Other: Agustina Caroli, May Augustin ,  NP 01/03/2016 9:30 AM  Other:    Other:    Other:    Other:     Scribe for Treatment Team:  Tilden Fossa, MSW, [place]
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
## 2016-01-03 NOTE — Progress Notes (Signed)
Patient ID: Margarette CanadaJose A II Markes, male   DOB: Feb 15, 1971, 45 y.o.   MRN: [id number]   Pt currently presents with a flat affect and sullen behavior. Per self inventory, pt rates depression, hopelessness and anxiety at a 10. Pt's daily goal is to "recovery" and they intend to do so by "rest." Pt reports poor sleep, a poor appetite, high energy and poor concentration. Pt reports anxiety about not being able to reach his wife, pt reports they share a phone and can only use the texting service.   Pt provided with medications per providers orders. Pt's labs and vitals were monitored throughout the day. Pt supported emotionally and encouraged to express concerns and questions. Pt educated on medications.  Pt's safety ensured with 15 minute and environmental checks. Pt endorses some passive SI, no plan. Pt currently denies HI and A/V hallucinations. Pt verbally agrees to seek staff if HI or A/VH occurs and to consult with staff before acting on any harmful thoughts. Will continue POC.

## 2016-01-03 NOTE — [facility] Group Notes (Signed)
[facility] LCSW Group Therapy 01/03/2016  1:15 PM   Type of Therapy: Group Therapy  Participation Level: Did Not Attend. Patient invited to participate but declined.   Suhail Peloquin, MSW, LCSWA Clinical Social Worker [facility] [phone]   

## 2016-01-03 NOTE — Progress Notes (Signed)
Adult Psychoeducational Group Note  Date:  01/03/2016 Time:  10:27 AM  Group Topic/Focus:  Self Esteem Action Plan:   The focus of this group is to help patients create a plan to continue to build self-esteem after discharge.  Participation Level:  Active  Participation Quality:  Appropriate  Affect:  Flat  Cognitive:  Appropriate  Insight: Improving  Engagement in Group:  Improving  Modes of Intervention:  Discussion, Education and Support  Additional Comments:  Pt able to identify one daily goal to accomplish today.   Aurora Maskwyman, Hamdi Kley E 01/03/2016, 10:27 AM

## 2016-01-03 NOTE — Progress Notes (Signed)
ANTICOAGULATION CONSULT NOTE - Follow Up Consult  Pharmacy Consult for Coumadin  Indication: DVT  No Known Allergies  Patient Measurements: Height: 5\' 10"  (177.8 cm) Weight: 190 lb (86.183 kg) IBW/kg (Calculated) : 73   Vital Signs: Temp: 99.1 F (37.3 C) (01/12 0700) Temp Source: Oral (01/12 0700) BP: 120/75 mmHg (01/12 1201) Pulse Rate: 95 (01/12 1144)  Labs:  Recent Labs  01/02/16 1650  HGB 15.4  HCT 44.4  PLT 240  CREATININE 0.79    Estimated Creatinine Clearance: 122.9 mL/min (by C-G formula based on Cr of 0.79).   Medications:  Scheduled:  . atorvastatin  40 mg Oral Daily  . cloNIDine  0.1 mg Oral QID   Followed by  . [START ON 01/05/2016] cloNIDine  0.1 mg Oral BH-qamhs   Followed by  . [START ON 01/08/2016] cloNIDine  0.1 mg Oral QAC breakfast  . doxepin  25 mg Oral BID  . insulin aspart  0-20 Units Subcutaneous TID WC  . insulin aspart  0-5 Units Subcutaneous QHS  . insulin detemir  26 Units Subcutaneous QHS  . LORazepam  1 mg Oral QID   Followed by  . [START ON 01/04/2016] LORazepam  1 mg Oral TID   Followed by  . [START ON 01/05/2016] LORazepam  1 mg Oral BID   Followed by  . [START ON 01/06/2016] LORazepam  1 mg Oral Daily  . multivitamin with minerals  1 tablet Oral Daily  . nicotine  21 mg Transdermal Daily  . pantoprazole  40 mg Oral BID  . prazosin  2 mg Oral QHS  . QUEtiapine  300 mg Oral QHS  . rivaroxaban  20 mg Oral Q supper  . [START ON 01/04/2016] thiamine  100 mg Oral Daily    Assessment: Pt has been non compliant with medication.  Patient state that xarelto was started after a clot and he was to follow up with physician for imaging and the continued need for xarelto.  Patient has not followed up or been compliant.  We will restart xarelto and have him follow up with physician after discharge.     Plan:  Restart xarelto 20 mg daily, home dose  David Doyle, David Doyle 01/03/2016,1:52 PM
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
## 2016-01-04 DIAGNOSIS — F10230 Alcohol dependence with withdrawal, uncomplicated: Secondary | ICD-10-CM | POA: Insufficient documentation

## 2016-01-04 LAB — HEMOGLOBIN A1C
Hgb A1c MFr Bld: 11.7 % — ABNORMAL HIGH (ref 4.8–5.6)
Mean Plasma Glucose: 289 mg/dL

## 2016-01-04 LAB — GLUCOSE, CAPILLARY: GLUCOSE-CAPILLARY: 288 mg/dL — AB (ref 65–99)

## 2016-01-04 MED ORDER — LORAZEPAM 1 MG PO TABS: 1.0000 mg | ORAL_TABLET | ORAL | Status: DC

## 2016-01-04 MED ORDER — RIVAROXABAN 20 MG PO TABS: 20.0000 mg | ORAL_TABLET | Freq: Every day | ORAL | Status: DC

## 2016-01-04 MED ORDER — INSULIN DETEMIR 100 UNIT/ML ~~LOC~~ SOLN: 26.0000 [IU] | Freq: Every day | SUBCUTANEOUS | Status: DC

## 2016-01-04 MED ORDER — PANTOPRAZOLE SODIUM 40 MG PO TBEC: 40.0000 mg | DELAYED_RELEASE_TABLET | Freq: Two times a day (BID) | ORAL | Status: DC

## 2016-01-04 MED ORDER — ATORVASTATIN CALCIUM 40 MG PO TABS: 40.0000 mg | ORAL_TABLET | Freq: Every day | ORAL | Status: DC

## 2016-01-04 MED ORDER — PRAZOSIN HCL 2 MG PO CAPS: 2.0000 mg | ORAL_CAPSULE | Freq: Every day | ORAL | Status: DC

## 2016-01-04 MED ORDER — QUETIAPINE FUMARATE 300 MG PO TABS: 300.0000 mg | ORAL_TABLET | Freq: Every day | ORAL | Status: DC

## 2016-01-04 MED ORDER — METFORMIN HCL 500 MG PO TABS: 500.0000 mg | ORAL_TABLET | Freq: Two times a day (BID) | ORAL | Status: DC

## 2016-01-04 MED ORDER — NICOTINE 21 MG/24HR TD PT24: 21.0000 mg | MEDICATED_PATCH | Freq: Every day | TRANSDERMAL | Status: DC

## 2016-01-04 MED ORDER — HYDROXYZINE HCL 25 MG PO TABS: 25.0000 mg | ORAL_TABLET | Freq: Four times a day (QID) | ORAL | Status: DC | PRN

## 2016-01-04 MED ORDER — DOXEPIN HCL 25 MG PO CAPS: 25.0000 mg | ORAL_CAPSULE | Freq: Two times a day (BID) | ORAL | Status: DC

## 2016-01-04 NOTE — Progress Notes (Signed)
  [facility] :  Will you be returning to the same living situation after discharge:  Yes,  yes At discharge, do you have transportation home?: Yes,  will be provided with bus pass Do you have the ability to pay for your medications: Yes,  patient will be provided with prescriptions at discharge.  Release of information consent forms completed and in the chart;  Patient's signature needed at discharge.  Patient to Follow up at: Follow-up Information    Follow up with [facility] On 01/08/2016.   Specialty:  Pediatrics   Why:  Appointment time: 2:00 pm   Contact information:   [address] [place] [address] [phone]       Please follow up.   Why:  Follow-up appointment. Lake taking Xarelto      Follow up with [facility].   Why:  Walk-in clinic Monday-Friday between 9am to 5pm. Please arrive early in the morning & inform staff if you are and established patient to be seen as soon as possible.   Contact information:   [address],  [place], [place] [address] [phone]      Next level of care provider [facility] Link:no  Safety Planning and Suicide Prevention discussed: Yes,  with patient   Have you used any form of tobacco in the last 30 days? (Cigarettes, Smokeless Tobacco, Cigars, and/or Pipes): Yes  Has patient been referred to the Quitline?: Patient refused referral  Patient has been referred for addiction treatment: Yes  Lourdez Mcgahan, [place] CarboKristin L 01/04/2016, 2:39 PM

## 2016-01-04 NOTE — Progress Notes (Signed)
D: Pt denies SI/HI/AVH. Pt is pleasant and cooperative. Pt lethargic and isolated to room. Pt got up to get a snack but not much else this evening. A: Pt was offered support and encouragement. Pt was given scheduled medications. Pt was encourage to attend groups. Q 15 minute checks were done for safety.  R: Pt is taking medication. Pt has no complaints.Pt receptive to treatment and safety maintained on unit.

## 2016-01-04 NOTE — [facility] Group Notes (Signed)
   [facility] Note  01/04/2016  8:45 AM   Participation Quality: Alert, Appropriate and Oriented  Mood/Affect: Depressed and Flat  Depression Rating: Reports high depression level  Anxiety Rating: Reports high anxiety level  Thoughts of Suicide: Pt denies SI/HI  Will you contract for safety? Yes  Current AVH: Pt denies  Plan for Discharge/Comments: Pt attended discharge planning group and actively participated in group. CSW provided pt with today's workbook. Patient is interested in residential treatment and shared that not being able to contact his family is a stressor.  Transportation Means: Pt reports access to transportation  Supports: No supports mentioned at this time  Samuella BruinKristin Eaton Folmar, MSW, [employer] Inc[employer]CSWA Clinical Social Worker [facility] [phone]

## 2016-01-04 NOTE — Plan of Care (Signed)
Problem: Alteration in mood Goal: LTG-Patient reports reduction in suicidal thoughts (Patient reports reduction in suicidal thoughts and is able to verbalize a safety plan for whenever patient is feeling suicidal)  Outcome: Progressing Pt denies SI at this time     
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
## 2016-01-04 NOTE — [facility] Group Notes (Signed)
[facility] LCSW Group Therapy 01/04/2016  1:15 PM   Type of Therapy: Group Therapy  Participation Level: Did Not Attend. Patient invited to participate but declined.   Samuella BruinKristin Phoenyx Melka, MSW, [employer] IncLCSWA Clinical Social Worker [facility] [phone]

## 2016-01-04 NOTE — [facility] Suicide Risk Assessment (Signed)
[facility] INPATIENT:  Family/Significant Other Suicide Prevention Education  Suicide Prevention Education:  Patient Refusal for Family/Significant Other Suicide Prevention Education: The patient David Doyle has refused to provide written consent for family/significant other to be provided Family/Significant Other Suicide Prevention Education during admission and/or prior to discharge.  Physician notified. SPE reviewed with patient and brochure provided. Patient encouraged to return to hospital if having suicidal thoughts, patient verbalized his/her understanding and has no further questions at this time.   [name], West CarboKristin L 01/04/2016, 2:29 PM

## 2016-01-04 NOTE — Progress Notes (Signed)
Recreation Therapy Notes  Date: 01.13.2017  Time: 9:30am Location: 300 Group Room   Group Topic: Stress Management  Goal Area(s) Addresses:  Patient will actively participate in stress management techniques presented during session.   Behavioral Response: Did not attend.   Robertson Colclough L Keiley Levey, LRT/CTRS        Sabas Frett L 01/04/2016 2:41 PM 

## 2016-01-06 LAB — GLUCOSE, CAPILLARY: GLUCOSE-CAPILLARY: 274 mg/dL — AB (ref 65–99)

## 2016-02-25 ENCOUNTER — Emergency Department (HOSPITAL_COMMUNITY)
Admission: EM | Admit: 2016-02-25 | Discharge: 2016-02-27 | Disposition: A | Discharge status: Other Acute Inpatient Hospital | Payer: Medicaid Other | Attending: Emergency Medicine | Admitting: Emergency Medicine

## 2016-02-25 ENCOUNTER — Encounter (HOSPITAL_COMMUNITY): Payer: Self-pay

## 2016-02-25 DIAGNOSIS — E1165 Type 2 diabetes mellitus with hyperglycemia: Secondary | ICD-10-CM | POA: Diagnosis not present

## 2016-02-25 DIAGNOSIS — F3131 Bipolar disorder, current episode depressed, mild: Secondary | ICD-10-CM | POA: Diagnosis not present

## 2016-02-25 DIAGNOSIS — I10 Essential (primary) hypertension: Secondary | ICD-10-CM | POA: Diagnosis not present

## 2016-02-25 DIAGNOSIS — E119 Type 2 diabetes mellitus without complications: Secondary | ICD-10-CM | POA: Diagnosis not present

## 2016-02-25 DIAGNOSIS — Z79899 Other long term (current) drug therapy: Secondary | ICD-10-CM | POA: Diagnosis not present

## 2016-02-25 DIAGNOSIS — F1210 Cannabis abuse, uncomplicated: Secondary | ICD-10-CM | POA: Diagnosis not present

## 2016-02-25 DIAGNOSIS — Z8719 Personal history of other diseases of the digestive system: Secondary | ICD-10-CM | POA: Diagnosis not present

## 2016-02-25 DIAGNOSIS — R45851 Suicidal ideations: Secondary | ICD-10-CM | POA: Diagnosis not present

## 2016-02-25 DIAGNOSIS — F99 Mental disorder, not otherwise specified: Secondary | ICD-10-CM | POA: Diagnosis present

## 2016-02-25 DIAGNOSIS — F10220 Alcohol dependence with intoxication, uncomplicated: Secondary | ICD-10-CM | POA: Insufficient documentation

## 2016-02-25 DIAGNOSIS — R739 Hyperglycemia, unspecified: Secondary | ICD-10-CM

## 2016-02-25 LAB — CBC WITH DIFFERENTIAL/PLATELET
BASOS ABS: 0 10*3/uL (ref 0.0–0.1)
BASOS PCT: 0 %
Eosinophils Absolute: 0 10*3/uL (ref 0.0–0.7)
Eosinophils Relative: 0 %
HEMATOCRIT: 40.4 % (ref 39.0–52.0)
Hemoglobin: 14 g/dL (ref 13.0–17.0)
LYMPHS PCT: 28 %
Lymphs Abs: 3.3 10*3/uL (ref 0.7–4.0)
MCH: 28.9 pg (ref 26.0–34.0)
MCHC: 34.7 g/dL (ref 30.0–36.0)
MCV: 83.5 fL (ref 78.0–100.0)
MONO ABS: 0.4 10*3/uL (ref 0.1–1.0)
Monocytes Relative: 4 %
Neutro Abs: 8.2 10*3/uL — ABNORMAL HIGH (ref 1.7–7.7)
Neutrophils Relative %: 68 %
PLATELETS: 240 10*3/uL (ref 150–400)
RBC: 4.84 MIL/uL (ref 4.22–5.81)
RDW: 12.4 % (ref 11.5–15.5)
WBC: 11.9 10*3/uL — AB (ref 4.0–10.5)

## 2016-02-25 LAB — URINALYSIS, ROUTINE W REFLEX MICROSCOPIC
Bilirubin Urine: NEGATIVE
Hgb urine dipstick: NEGATIVE
Ketones, ur: NEGATIVE mg/dL
LEUKOCYTES UA: NEGATIVE
NITRITE: NEGATIVE
PH: 6 (ref 5.0–8.0)
Protein, ur: NEGATIVE mg/dL
SPECIFIC GRAVITY, URINE: 1.009 (ref 1.005–1.030)

## 2016-02-25 LAB — COMPREHENSIVE METABOLIC PANEL
ALT: 13 U/L — ABNORMAL LOW (ref 17–63)
ANION GAP: 14 (ref 5–15)
AST: 12 U/L — AB (ref 15–41)
Albumin: 3.6 g/dL (ref 3.5–5.0)
Alkaline Phosphatase: 96 U/L (ref 38–126)
BILIRUBIN TOTAL: 0.6 mg/dL (ref 0.3–1.2)
BUN: 10 mg/dL (ref 6–20)
CHLORIDE: 98 mmol/L — AB (ref 101–111)
CO2: 23 mmol/L (ref 22–32)
Calcium: 9.5 mg/dL (ref 8.9–10.3)
Creatinine, Ser: 0.77 mg/dL (ref 0.61–1.24)
Glucose, Bld: 502 mg/dL — ABNORMAL HIGH (ref 65–99)
POTASSIUM: 3.5 mmol/L (ref 3.5–5.1)
Sodium: 135 mmol/L (ref 135–145)
TOTAL PROTEIN: 6.8 g/dL (ref 6.5–8.1)

## 2016-02-25 LAB — RAPID URINE DRUG SCREEN, HOSP PERFORMED
AMPHETAMINES: NOT DETECTED
BARBITURATES: NOT DETECTED
BENZODIAZEPINES: NOT DETECTED
COCAINE: NOT DETECTED
Opiates: NOT DETECTED
Tetrahydrocannabinol: POSITIVE — AB

## 2016-02-25 LAB — URINE MICROSCOPIC-ADD ON: RBC / HPF: NONE SEEN RBC/hpf (ref 0–5)

## 2016-02-25 LAB — SALICYLATE LEVEL: Salicylate Lvl: <4 mg/dL (ref 2.8–30.0)

## 2016-02-25 LAB — ETHANOL: ALCOHOL ETHYL (B): 172 mg/dL — AB (ref <5)

## 2016-02-25 LAB — ACETAMINOPHEN LEVEL: Acetaminophen (Tylenol), Serum: <10 ug/mL — ABNORMAL LOW (ref 10–30)

## 2016-02-25 LAB — CBG MONITORING, ED
GLUCOSE-CAPILLARY: 273 mg/dL — AB (ref 65–99)
GLUCOSE-CAPILLARY: 244 mg/dL — AB (ref 65–99)

## 2016-02-25 MED ORDER — LORAZEPAM 1 MG PO TABS: 0.0000 mg | ORAL_TABLET | Freq: Two times a day (BID) | ORAL | Status: DC

## 2016-02-25 MED ORDER — INSULIN ASPART 100 UNIT/ML ~~LOC~~ SOLN: 10.0000 [IU] | Freq: Once | SUBCUTANEOUS | Status: DC

## 2016-02-25 MED ORDER — SODIUM CHLORIDE 0.9 % IV BOLUS (SEPSIS): 1000.0000 mL | Freq: Once | INTRAVENOUS | Status: DC

## 2016-02-25 MED ORDER — LORAZEPAM 1 MG PO TABS
0.0000 mg | ORAL_TABLET | Freq: Four times a day (QID) | ORAL | Status: DC
  Administered 2016-02-25 – 2016-02-26 (×2): 1 mg via ORAL
  Administered 2016-02-26: 2 mg via ORAL
  Administered 2016-02-26: 1 mg via ORAL
  Filled 2016-02-25 (×3): qty 1
  Filled 2016-02-25: qty 2

## 2016-02-25 MED ORDER — VITAMIN B-1 100 MG PO TABS
100.0000 mg | ORAL_TABLET | Freq: Every day | ORAL | Status: DC
  Administered 2016-02-25 – 2016-02-26 (×2): 100 mg via ORAL
  Filled 2016-02-25 (×2): qty 1

## 2016-02-25 MED ORDER — SODIUM CHLORIDE 0.9 % IV BOLUS (SEPSIS)
2000.0000 mL | Freq: Once | INTRAVENOUS | Status: AC
  Administered 2016-02-25: 2000 mL via INTRAVENOUS

## 2016-02-25 MED ORDER — THIAMINE HCL 100 MG/ML IJ SOLN: 100.0000 mg | Freq: Every day | INTRAMUSCULAR | Status: DC
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
## 2016-02-25 NOTE — Progress Notes (Signed)
Patient meets psychiatric inpatient criteria, per NP Fransisca KaufmannLaura Davis.  Patient has been referred to the following facilities: [facility] - accepting referrals, per Joice, d/c in the am. [facility] Dennie BiblePat, will take a look at referral.  [facility] - [facility]  [facility], [facility], and [facility].  CSW will continue to seek placement.  [place]bourne Abtsatia Ardean Melroy, LCSWA Disposition staff 02/25/2016 10:04 PM

## 2016-02-25 NOTE — ED Notes (Signed)
Pt speaking with BH via TTS.

## 2016-02-25 NOTE — ED Notes (Signed)
Pt belongings inventoried and valuables locked up with security.

## 2016-02-25 NOTE — ED Notes (Signed)
Patient here requesting detox from ETOH, opiates, and marijuana. Last use of ETOH 1 hour pta, last opiates used 2 days ago. No anxiety, no nausea

## 2016-02-25 NOTE — BH Assessment (Addendum)
Per Vernona RiegerLaura, NP - patient meets criteria for inpatient hospitalization.  [facility] In[facility] Fermoina, [facility] patient glucose level must be below 350 for 24 hours. Writer informed the RN Pryor Ochoa(Brend) of the patients disposition.

## 2016-02-25 NOTE — ED Notes (Signed)
Spoke with BH.  [facility] cbg less than 350 for 24 hours.

## 2016-02-25 NOTE — ED Provider Notes (Signed)
CSN: [id number]     Arrival date & time 02/25/16  1153 History   First MD Initiated Contact with Patient 02/25/16 1541     Chief Complaint  Patient presents with  . detox      (Consider location/radiation/quality/duration/timing/severity/associated sxs/prior Treatment) Patient is a 45 y.o. male presenting with mental health disorder. The history is provided by the patient.  Mental Health Problem Presenting symptoms: depression and suicidal thoughts   Patient accompanied by:  Partner Degree of incapacity (severity):  Moderate Onset quality:  Gradual Duration:  2 weeks Timing:  Constant Progression:  Unchanged Chronicity:  New Context: alcohol use, drug abuse and noncompliance (with all medication)   Treatment compliance:  Untreated Relieved by:  Nothing Worsened by:  Nothing tried Ineffective treatments:  None tried Associated symptoms: feelings of worthlessness and poor judgment   Risk factors: hx of mental illness and recent psychiatric admission (to [place])     Past Medical History  Diagnosis Date  . GERD (gastroesophageal reflux disease)   . Pancreatitis   . Diabetes mellitus without complication (HCC)   . Hypertension   . Depression   . Suicidal behavior   . Substance abuse   . PTSD (post-traumatic stress disorder)   . Bipolar 1 disorder (HCC)   . Portal vein thrombosis    Past Surgical History  Procedure Laterality Date  . Tonsillectomy    . Adenoidectomy     No family history on file. Social History  Substance Use Topics  . Smoking status: Current Every Day Smoker -- 2.00 packs/day  . Smokeless tobacco: None  . Alcohol Use: Yes     Comment: occasionally    Review of Systems  Psychiatric/Behavioral: Positive for suicidal ideas.  All other systems reviewed and are negative.     Allergies  Review of patient's allergies indicates no known allergies.  Home Medications   Prior to Admission medications   Medication Sig Start Date End Date Taking?  Authorizing Provider  atorvastatin (LIPITOR) 40 MG tablet Take 1 tablet (40 mg total) by mouth daily. 01/04/16   Beau Fanny, FNP  doxepin (SINEQUAN) 25 MG capsule Take 1 capsule (25 mg total) by mouth 2 (two) times daily. Patient not taking: Reported on 02/25/2016 01/04/16   Beau Fanny, FNP  hydrOXYzine (ATARAX/VISTARIL) 25 MG tablet Take 1 tablet (25 mg total) by mouth every 6 (six) hours as needed for anxiety. 01/04/16   Beau Fanny, FNP  insulin detemir (LEVEMIR) 100 UNIT/ML injection Inject 0.26 mLs (26 Units total) into the skin at bedtime. And adjust per your ORIGINAL prescriber Patient not taking: Reported on 02/25/2016 01/04/16   Beau Fanny, FNP  LORazepam (ATIVAN) 1 MG tablet Take 1 tablet (1 mg total) by mouth as directed. Day 1: 1 tablet every 6 hours; Day 2: 1 tablet every 8 hours. Day 3: 1 tablet every 12 hours. Day 4: 1 tablet in AM 01/04/16   Beau Fanny, FNP  metFORMIN (GLUCOPHAGE) 500 MG tablet Take 1 tablet (500 mg total) by mouth 2 (two) times daily with a meal. Patient not taking: Reported on 02/25/2016 01/04/16   Beau Fanny, FNP  nicotine (NICODERM CQ - DOSED IN MG/24 HOURS) 21 mg/24hr patch Place 1 patch (21 mg total) onto the skin daily. 01/04/16   Beau Fanny, FNP  pantoprazole (PROTONIX) 40 MG tablet Take 1 tablet (40 mg total) by mouth 2 (two) times daily. Patient not taking: Reported on 02/25/2016 01/04/16   Beau Fanny,  FNP  prazosin (MINIPRESS) 2 MG capsule Take 1 capsule (2 mg total) by mouth at bedtime. Patient not taking: Reported on 02/25/2016 01/04/16   Beau FannyJohn C Withrow, FNP  QUEtiapine (SEROQUEL) 300 MG tablet Take 1 tablet (300 mg total) by mouth at bedtime. Patient not taking: Reported on 02/25/2016 01/04/16   Beau FannyJohn C Withrow, FNP  rivaroxaban (XARELTO) 20 MG TABS tablet Take 1 tablet (20 mg total) by mouth daily with supper. Patient not taking: Reported on 02/25/2016 01/04/16   Everardo AllJohn C Withrow, FNP   BP 106/66 mmHg  Pulse 94  Temp(Src) 97.9 F (36.6 C)  (Oral)  Resp 19  Ht 5\' 9"  (1.753 m)  Wt 180 lb (81.647 kg)  BMI 26.57 kg/m2  SpO2 92% Physical Exam  Constitutional: He is oriented to person, place, and time. He appears well-developed and well-nourished. No distress.  HENT:  Head: Normocephalic and atraumatic.  Eyes: Conjunctivae are normal.  Neck: Neck supple. No tracheal deviation present.  Cardiovascular: Normal rate and regular rhythm.   Pulmonary/Chest: Effort normal. No respiratory distress.  Abdominal: Soft. He exhibits no distension.  Neurological: He is alert and oriented to person, place, and time.  Skin: Skin is warm and dry.  Psychiatric: His affect is blunt. He is slowed. He exhibits a depressed mood. He expresses suicidal ideation. He expresses suicidal plans (overdose on heroin).    ED Course  Procedures (including critical care time) Labs Review Labs Reviewed  COMPREHENSIVE METABOLIC PANEL - Abnormal; Notable for the following:    Chloride 98 (*)    Glucose, Bld 502 (*)    AST 12 (*)    ALT 13 (*)    All other components within normal limits  ETHANOL - Abnormal; Notable for the following:    Alcohol, Ethyl (B) 172 (*)    All other components within normal limits  CBC WITH DIFFERENTIAL/PLATELET - Abnormal; Notable for the following:    WBC 11.9 (*)    Neutro Abs 8.2 (*)    All other components within normal limits  URINE RAPID DRUG SCREEN, HOSP PERFORMED - Abnormal; Notable for the following:    Tetrahydrocannabinol POSITIVE (*)    All other components within normal limits  ACETAMINOPHEN LEVEL - Abnormal; Notable for the following:    Acetaminophen (Tylenol), Serum <10 (*)    All other components within normal limits  URINALYSIS, ROUTINE W REFLEX MICROSCOPIC (NOT AT [facility]) - Abnormal; Notable for the following:    Glucose, UA >1000 (*)    All other components within normal limits  URINE MICROSCOPIC-ADD ON - Abnormal; Notable for the following:    Squamous Epithelial / LPF 0-5 (*)    Bacteria, UA RARE  (*)    All other components within normal limits  SALICYLATE LEVEL    Imaging Review No results found. I have personally reviewed and evaluated these images and lab results as part of my medical decision-making.   EKG Interpretation None      MDM   Final diagnoses:  Alcohol dependence with uncomplicated intoxication (HCC)  Hyperglycemia without ketosis  Suicidal ideation    45 y.o. male presents with suicidal ideation, chronic alcohol and drug abuse. Requesting detox. Endorsing plan for overdose on heroin. Uncomplicated hyperglycemia likely 2/2 noncompliance. Pt will be given IVF and insulin with food to correct glucose. Insulin held after glucose fell appropriately. TTS consult to evaluate for suicidal thoughts. Here voluntarily. MEDICALLY CLEAR FOR TRANSFER OR PSYCHIATRIC ADMISSION.     Lyndal Pulleyaniel Tamyka Bezio, MD 02/26/16 [phone]

## 2016-02-26 ENCOUNTER — Encounter (HOSPITAL_COMMUNITY): Payer: Self-pay | Admitting: Emergency Medicine

## 2016-02-26 LAB — CBG MONITORING, ED
GLUCOSE-CAPILLARY: 183 mg/dL — AB (ref 65–99)
Glucose-Capillary: 291 mg/dL — ABNORMAL HIGH (ref 65–99)
Glucose-Capillary: 296 mg/dL — ABNORMAL HIGH (ref 65–99)
Glucose-Capillary: 281 mg/dL — ABNORMAL HIGH (ref 65–99)

## 2016-02-26 MED ORDER — INSULIN DETEMIR 100 UNIT/ML ~~LOC~~ SOLN
16.0000 [IU] | Freq: Every day | SUBCUTANEOUS | Status: DC
  Administered 2016-02-26: 16 [IU] via SUBCUTANEOUS
  Filled 2016-02-26 (×2): qty 0.16

## 2016-02-26 MED ORDER — INSULIN ASPART 100 UNIT/ML ~~LOC~~ SOLN
0.0000 [IU] | Freq: Three times a day (TID) | SUBCUTANEOUS | Status: DC
  Administered 2016-02-26: 8 [IU] via SUBCUTANEOUS
  Filled 2016-02-26: qty 1

## 2016-02-26 NOTE — ED Notes (Signed)
Diabetes coordinator making recommendations for doctor to review. Dr. Effie ShyWentz made aware.

## 2016-02-26 NOTE — Progress Notes (Addendum)
Seeking inpatient psychiatric treatment for pt. [facility]. (AC noted 02/25/16 pt's CBG needs to be under 350 for 24 hours beginning 5pm 02/25/16)  [facility]- per [place]  [facility] and will refer if there is bed availability.  Ilean SkillMeghan Amulya Quintin, MSW, LCSW Clinical Social Work, Disposition  02/26/2016 [phone]

## 2016-02-26 NOTE — Progress Notes (Signed)
Inpatient Diabetes Program Recommendations  AACE/ADA: New Consensus Statement on Inpatient Glycemic Control (2015)  Target Ranges:  Prepandial:   less than 140 mg/dL      Peak postprandial:   less than 180 mg/dL (1-2 hours)      Critically ill patients:  140 - 180 mg/dL   Results for Margarette CanadaMERCED, Latravis A II (MRN [id number]) as of 02/26/2016 10:58  Ref. Range 02/25/2016 17:10 02/25/2016 19:23 02/26/2016 07:19  Glucose-Capillary Latest Ref Range: 65-99 mg/dL 409273 (H) 811244 (H) 914291 (H)    To [facility] ED with: Requesting Detox  History: DM  Home DM Meds: Levemir 26 units QHS (not taking per patient report)       Metformin 500 mg bid (not taking per patient report)  Current Insulin Orders: None     MD- Note patient waiting for Inpatient Placement.  Please consider starting the following insulin to keep CBGs under control while waiting for placement:  1.Start Levemir 16 units daily (0.2 units/kg dosing)  2. Start Novolog Moderate Correction Scale/ SSI (0-15 units) TID AC + HS (Use Glycemic Control Order set)      --Will follow patient during hospitalization--  Ambrose FinlandJeannine Johnston Rosemary Mossbarger RN, MSN, CDE Diabetes Coordinator Inpatient Glycemic Control Team Team Pager: [phone] ([phone])'

## 2016-02-26 NOTE — ED Notes (Signed)
CIWA score 4. Patient states that he "always feels a little anxious, but doing better." Will hold Ativan at this time. Patient just finished eating sandwich and quickly fell back asleep.

## 2016-02-26 NOTE — ED Notes (Signed)
Pt ambulating out to use the phone. Reports ativan has helped him to relax. Pt is calm, cooperative. Describes to RN a lot of "life stress". His kids are in social services and he lost his home. His wife is working with the [employer] to try and secure a place for them to stay.

## 2016-02-26 NOTE — ED Notes (Addendum)
Patient was given a snack and drink, and a Carb Motified diet ordered for lunch. 

## 2016-02-26 NOTE — ED Provider Notes (Signed)
16:10-patient was seen earlier by the diabetes coordinator, who recommends starting basal insulin, and sliding-scale insulin, using moderate dosing. I have initiated these orders. Apparently the patient is being set up for admission to a psychiatric facility, later this afternoon.  David BaleElliott Betul Brisky, MD 02/26/16 1719

## 2016-02-26 NOTE — ED Notes (Signed)
[facility] called and pt. Has bed at 10pm room 307 bed 1 with accepting MD Dr. Dub MikesLugo. Report to be called at [phone].

## 2016-02-26 NOTE — ED Notes (Signed)
Transportation services called for transport, it will not be able to come until 12 am.

## 2016-02-26 NOTE — Progress Notes (Signed)
[name] to Dr. Dub MikesLugo, to room 307-1, arrival time - 10pm. RN Denny Peonrin was informed.  David Doyle, LCSWA Disposition staff 02/26/2016 6:39 PM

## 2016-02-26 NOTE — ED Notes (Signed)
Patient moved to room 23 d/t lack of sitter availability. Patient cooperatively walked to room.

## 2016-02-27 ENCOUNTER — Encounter (HOSPITAL_COMMUNITY): Payer: Self-pay | Admitting: Behavioral Health

## 2016-02-27 ENCOUNTER — Inpatient Hospital Stay (HOSPITAL_COMMUNITY)
Admission: AD | Admit: 2016-02-27 | Discharge: 2016-03-03 | DRG: 897 | Disposition: A | Discharge status: Home / Self Care | Payer: Medicaid Other | Source: Intra-hospital | Attending: Psychiatry | Admitting: Psychiatry

## 2016-02-27 DIAGNOSIS — E7800 Pure hypercholesterolemia, unspecified: Secondary | ICD-10-CM | POA: Diagnosis present

## 2016-02-27 DIAGNOSIS — G4700 Insomnia, unspecified: Secondary | ICD-10-CM | POA: Diagnosis present

## 2016-02-27 DIAGNOSIS — Z7984 Long term (current) use of oral hypoglycemic drugs: Secondary | ICD-10-CM

## 2016-02-27 DIAGNOSIS — E119 Type 2 diabetes mellitus without complications: Secondary | ICD-10-CM | POA: Diagnosis present

## 2016-02-27 DIAGNOSIS — I10 Essential (primary) hypertension: Secondary | ICD-10-CM | POA: Diagnosis present

## 2016-02-27 DIAGNOSIS — F4310 Post-traumatic stress disorder, unspecified: Secondary | ICD-10-CM | POA: Diagnosis present

## 2016-02-27 DIAGNOSIS — R45851 Suicidal ideations: Secondary | ICD-10-CM | POA: Diagnosis present

## 2016-02-27 DIAGNOSIS — F332 Major depressive disorder, recurrent severe without psychotic features: Secondary | ICD-10-CM | POA: Diagnosis present

## 2016-02-27 DIAGNOSIS — F1024 Alcohol dependence with alcohol-induced mood disorder: Principal | ICD-10-CM | POA: Diagnosis present

## 2016-02-27 DIAGNOSIS — Z7901 Long term (current) use of anticoagulants: Secondary | ICD-10-CM | POA: Diagnosis not present

## 2016-02-27 DIAGNOSIS — F17210 Nicotine dependence, cigarettes, uncomplicated: Secondary | ICD-10-CM | POA: Diagnosis present

## 2016-02-27 DIAGNOSIS — F10188 Alcohol abuse with other alcohol-induced disorder: Secondary | ICD-10-CM | POA: Diagnosis present

## 2016-02-27 DIAGNOSIS — F10239 Alcohol dependence with withdrawal, unspecified: Secondary | ICD-10-CM | POA: Diagnosis present

## 2016-02-27 DIAGNOSIS — F10988 Alcohol use, unspecified with other alcohol-induced disorder: Secondary | ICD-10-CM

## 2016-02-27 DIAGNOSIS — F1120 Opioid dependence, uncomplicated: Secondary | ICD-10-CM | POA: Diagnosis present

## 2016-02-27 DIAGNOSIS — Z79899 Other long term (current) drug therapy: Secondary | ICD-10-CM | POA: Diagnosis not present

## 2016-02-27 DIAGNOSIS — K219 Gastro-esophageal reflux disease without esophagitis: Secondary | ICD-10-CM | POA: Diagnosis present

## 2016-02-27 LAB — LIPID PANEL
CHOL/HDL RATIO: 5.6 ratio
Cholesterol: 288 mg/dL — ABNORMAL HIGH (ref 0–200)
HDL: 51 mg/dL (ref >40)
LDL CALC: 180 mg/dL — AB (ref 0–99)
Triglycerides: 287 mg/dL — ABNORMAL HIGH (ref <150)
VLDL: 57 mg/dL — ABNORMAL HIGH (ref 0–40)

## 2016-02-27 LAB — GLUCOSE, CAPILLARY
GLUCOSE-CAPILLARY: 203 mg/dL — AB (ref 65–99)
GLUCOSE-CAPILLARY: 348 mg/dL — AB (ref 65–99)
GLUCOSE-CAPILLARY: 183 mg/dL — AB (ref 65–99)
Glucose-Capillary: 345 mg/dL — ABNORMAL HIGH (ref 65–99)
Glucose-Capillary: 256 mg/dL — ABNORMAL HIGH (ref 65–99)

## 2016-02-27 LAB — TSH: TSH: 1.279 u[IU]/mL (ref 0.350–4.500)

## 2016-02-27 MED ORDER — DOXEPIN HCL 25 MG PO CAPS
25.0000 mg | ORAL_CAPSULE | Freq: Two times a day (BID) | ORAL | Status: DC
  Administered 2016-02-27: 25 mg via ORAL
  Filled 2016-02-27 (×6): qty 1

## 2016-02-27 MED ORDER — SERTRALINE HCL 50 MG PO TABS
150.0000 mg | ORAL_TABLET | Freq: Every day | ORAL | Status: DC
  Administered 2016-02-27 – 2016-03-03 (×6): 150 mg via ORAL
  Filled 2016-02-27 (×8): qty 1

## 2016-02-27 MED ORDER — RIVAROXABAN 20 MG PO TABS: 20.0000 mg | ORAL_TABLET | Freq: Every day | ORAL | Status: DC

## 2016-02-27 MED ORDER — ONDANSETRON 4 MG PO TBDP: 4.0000 mg | ORAL_TABLET | Freq: Four times a day (QID) | ORAL | Status: DC | PRN

## 2016-02-27 MED ORDER — LORAZEPAM 1 MG PO TABS
1.0000 mg | ORAL_TABLET | Freq: Every day | ORAL | Status: AC
  Administered 2016-03-02: 1 mg via ORAL
  Filled 2016-02-27: qty 1

## 2016-02-27 MED ORDER — ZIPRASIDONE HCL 20 MG PO CAPS
20.0000 mg | ORAL_CAPSULE | Freq: Every day | ORAL | Status: DC
  Filled 2016-02-27 (×2): qty 1

## 2016-02-27 MED ORDER — LOPERAMIDE HCL 2 MG PO CAPS: 2.0000 mg | ORAL_CAPSULE | ORAL | Status: AC | PRN

## 2016-02-27 MED ORDER — LORAZEPAM 1 MG PO TABS
1.0000 mg | ORAL_TABLET | Freq: Two times a day (BID) | ORAL | Status: AC
  Administered 2016-02-29 – 2016-03-01 (×2): 1 mg via ORAL
  Filled 2016-02-27 (×2): qty 1

## 2016-02-27 MED ORDER — ALUM & MAG HYDROXIDE-SIMETH 200-200-20 MG/5ML PO SUSP: 30.0000 mL | ORAL | Status: DC | PRN

## 2016-02-27 MED ORDER — ZIPRASIDONE HCL 80 MG PO CAPS
80.0000 mg | ORAL_CAPSULE | Freq: Every day | ORAL | Status: DC
  Filled 2016-02-27: qty 1

## 2016-02-27 MED ORDER — NICOTINE 21 MG/24HR TD PT24
21.0000 mg | MEDICATED_PATCH | Freq: Every day | TRANSDERMAL | Status: DC
  Administered 2016-02-27 – 2016-02-29 (×3): 21 mg via TRANSDERMAL
  Filled 2016-02-27 (×8): qty 1

## 2016-02-27 MED ORDER — INSULIN DETEMIR 100 UNIT/ML ~~LOC~~ SOLN
26.0000 [IU] | Freq: Every day | SUBCUTANEOUS | Status: DC
  Administered 2016-02-27 – 2016-03-02 (×5): 26 [IU] via SUBCUTANEOUS

## 2016-02-27 MED ORDER — LOSARTAN POTASSIUM 50 MG PO TABS
50.0000 mg | ORAL_TABLET | Freq: Every day | ORAL | Status: DC
  Administered 2016-02-27 – 2016-03-03 (×5): 50 mg via ORAL
  Filled 2016-02-27 (×8): qty 1

## 2016-02-27 MED ORDER — METFORMIN HCL 500 MG PO TABS
500.0000 mg | ORAL_TABLET | Freq: Two times a day (BID) | ORAL | Status: DC
  Administered 2016-02-27 – 2016-03-03 (×11): 500 mg via ORAL
  Filled 2016-02-27 (×14): qty 1

## 2016-02-27 MED ORDER — PANTOPRAZOLE SODIUM 40 MG PO TBEC
40.0000 mg | DELAYED_RELEASE_TABLET | Freq: Two times a day (BID) | ORAL | Status: DC
  Administered 2016-02-27 – 2016-03-03 (×11): 40 mg via ORAL
  Filled 2016-02-27 (×14): qty 1

## 2016-02-27 MED ORDER — PRAZOSIN HCL 2 MG PO CAPS
2.0000 mg | ORAL_CAPSULE | Freq: Every day | ORAL | Status: DC
  Filled 2016-02-27: qty 1

## 2016-02-27 MED ORDER — HYDROXYZINE HCL 25 MG PO TABS: 25.0000 mg | ORAL_TABLET | Freq: Four times a day (QID) | ORAL | Status: DC | PRN

## 2016-02-27 MED ORDER — TOPIRAMATE 25 MG PO TABS
50.0000 mg | ORAL_TABLET | Freq: Three times a day (TID) | ORAL | Status: DC
  Administered 2016-02-27 – 2016-03-03 (×15): 50 mg via ORAL
  Filled 2016-02-27 (×20): qty 2

## 2016-02-27 MED ORDER — CLONIDINE HCL 0.1 MG PO TABS
0.1000 mg | ORAL_TABLET | Freq: Every day | ORAL | Status: DC
  Administered 2016-03-03: 0.1 mg via ORAL
  Filled 2016-02-27 (×2): qty 1

## 2016-02-27 MED ORDER — PRAZOSIN HCL 1 MG PO CAPS
1.0000 mg | ORAL_CAPSULE | Freq: Every day | ORAL | Status: DC
  Filled 2016-02-27 (×2): qty 1

## 2016-02-27 MED ORDER — CLONIDINE HCL 0.1 MG PO TABS
0.1000 mg | ORAL_TABLET | Freq: Four times a day (QID) | ORAL | Status: AC
  Administered 2016-02-27 – 2016-02-29 (×10): 0.1 mg via ORAL
  Filled 2016-02-27 (×11): qty 1

## 2016-02-27 MED ORDER — TRAZODONE HCL 50 MG PO TABS
50.0000 mg | ORAL_TABLET | Freq: Every evening | ORAL | Status: DC | PRN
  Filled 2016-02-27 (×4): qty 1

## 2016-02-27 MED ORDER — INSULIN ASPART 100 UNIT/ML ~~LOC~~ SOLN
8.0000 [IU] | Freq: Once | SUBCUTANEOUS | Status: AC
  Administered 2016-02-27: 8 [IU] via SUBCUTANEOUS

## 2016-02-27 MED ORDER — DICYCLOMINE HCL 20 MG PO TABS: 20.0000 mg | ORAL_TABLET | Freq: Four times a day (QID) | ORAL | Status: DC | PRN

## 2016-02-27 MED ORDER — VITAMIN B-1 100 MG PO TABS
100.0000 mg | ORAL_TABLET | Freq: Every day | ORAL | Status: DC
Start: 2016-02-28 — End: 2016-03-03
  Administered 2016-02-28 – 2016-03-03 (×5): 100 mg via ORAL
  Filled 2016-02-27 (×6): qty 1

## 2016-02-27 MED ORDER — ADULT MULTIVITAMIN W/MINERALS CH
1.0000 | ORAL_TABLET | Freq: Every day | ORAL | Status: DC
  Administered 2016-02-27 – 2016-03-03 (×6): 1 via ORAL
  Filled 2016-02-27 (×8): qty 1

## 2016-02-27 MED ORDER — LORAZEPAM 1 MG PO TABS
1.0000 mg | ORAL_TABLET | Freq: Three times a day (TID) | ORAL | Status: AC
  Administered 2016-02-28 – 2016-02-29 (×3): 1 mg via ORAL
  Filled 2016-02-27 (×3): qty 1

## 2016-02-27 MED ORDER — ACETAMINOPHEN 325 MG PO TABS
650.0000 mg | ORAL_TABLET | Freq: Four times a day (QID) | ORAL | Status: DC | PRN
  Administered 2016-03-01: 650 mg via ORAL
  Filled 2016-02-27: qty 2

## 2016-02-27 MED ORDER — NAPROXEN 500 MG PO TABS: 500.0000 mg | ORAL_TABLET | Freq: Two times a day (BID) | ORAL | Status: DC | PRN

## 2016-02-27 MED ORDER — CLONIDINE HCL 0.1 MG PO TABS
0.1000 mg | ORAL_TABLET | ORAL | Status: AC
  Administered 2016-03-01 – 2016-03-02 (×3): 0.1 mg via ORAL
  Filled 2016-02-27 (×4): qty 1

## 2016-02-27 MED ORDER — METHOCARBAMOL 500 MG PO TABS: 500.0000 mg | ORAL_TABLET | Freq: Three times a day (TID) | ORAL | Status: DC | PRN

## 2016-02-27 MED ORDER — HYDROXYZINE HCL 25 MG PO TABS
25.0000 mg | ORAL_TABLET | Freq: Four times a day (QID) | ORAL | Status: AC | PRN
  Administered 2016-02-27: 25 mg via ORAL
  Filled 2016-02-27: qty 1

## 2016-02-27 MED ORDER — INSULIN ASPART 100 UNIT/ML ~~LOC~~ SOLN
0.0000 [IU] | Freq: Three times a day (TID) | SUBCUTANEOUS | Status: DC
  Administered 2016-02-27: 7 [IU] via SUBCUTANEOUS
  Administered 2016-02-27: 15 [IU] via SUBCUTANEOUS
  Administered 2016-02-27: 11 [IU] via SUBCUTANEOUS
  Administered 2016-02-28: 15 [IU] via SUBCUTANEOUS
  Administered 2016-02-28: 11 [IU] via SUBCUTANEOUS
  Administered 2016-02-28: 7 [IU] via SUBCUTANEOUS
  Administered 2016-02-29 (×2): 20 [IU] via SUBCUTANEOUS
  Administered 2016-02-29: 7 [IU] via SUBCUTANEOUS
  Administered 2016-03-01: 4 [IU] via SUBCUTANEOUS
  Administered 2016-03-01: 11 [IU] via SUBCUTANEOUS
  Administered 2016-03-01: 15 [IU] via SUBCUTANEOUS
  Administered 2016-03-02 (×2): 11 [IU] via SUBCUTANEOUS
  Administered 2016-03-02: 15 [IU] via SUBCUTANEOUS
  Administered 2016-03-03: 4 [IU] via SUBCUTANEOUS

## 2016-02-27 MED ORDER — TRAZODONE HCL 50 MG PO TABS
50.0000 mg | ORAL_TABLET | Freq: Every evening | ORAL | Status: DC | PRN
Start: 2016-02-27 — End: 2016-02-27

## 2016-02-27 MED ORDER — QUETIAPINE FUMARATE 300 MG PO TABS
300.0000 mg | ORAL_TABLET | Freq: Every day | ORAL | Status: DC
  Filled 2016-02-27: qty 1

## 2016-02-27 MED ORDER — THIAMINE HCL 100 MG/ML IJ SOLN: 100.0000 mg | Freq: Once | INTRAMUSCULAR | Status: DC

## 2016-02-27 MED ORDER — LOSARTAN POTASSIUM 50 MG PO TABS
100.0000 mg | ORAL_TABLET | Freq: Every day | ORAL | Status: DC
  Filled 2016-02-27: qty 2

## 2016-02-27 MED ORDER — INSULIN ASPART 100 UNIT/ML ~~LOC~~ SOLN
0.0000 [IU] | Freq: Every day | SUBCUTANEOUS | Status: DC
Start: 2016-02-27 — End: 2016-03-03
  Administered 2016-03-02: 2 [IU] via SUBCUTANEOUS

## 2016-02-27 MED ORDER — MAGNESIUM HYDROXIDE 400 MG/5ML PO SUSP: 30.0000 mL | Freq: Every day | ORAL | Status: DC | PRN

## 2016-02-27 MED ORDER — LORAZEPAM 1 MG PO TABS
1.0000 mg | ORAL_TABLET | Freq: Four times a day (QID) | ORAL | Status: AC | PRN
  Administered 2016-02-29: 1 mg via ORAL
  Filled 2016-02-27 (×2): qty 1

## 2016-02-27 MED ORDER — ATORVASTATIN CALCIUM 40 MG PO TABS
40.0000 mg | ORAL_TABLET | Freq: Every day | ORAL | Status: DC
  Administered 2016-02-27 – 2016-03-03 (×6): 40 mg via ORAL
  Filled 2016-02-27 (×8): qty 1

## 2016-02-27 MED ORDER — LORAZEPAM 1 MG PO TABS
1.0000 mg | ORAL_TABLET | Freq: Four times a day (QID) | ORAL | Status: AC
  Administered 2016-02-27 – 2016-02-28 (×6): 1 mg via ORAL
  Filled 2016-02-27 (×5): qty 1

## 2016-02-27 MED ORDER — QUETIAPINE FUMARATE 100 MG PO TABS
100.0000 mg | ORAL_TABLET | Freq: Every day | ORAL | Status: DC
  Administered 2016-02-27 – 2016-03-02 (×5): 100 mg via ORAL
  Filled 2016-02-27 (×6): qty 1

## 2016-02-27 NOTE — Progress Notes (Signed)
Recreation Therapy Notes  Date: 03.08.2017 Time: 9:30am Location: 300 Hall Group Room  Group Topic: Stress Management  Goal Area(s) Addresses:  Patient will actively participate in stress management techniques presented during session.   Behavioral Response: Did not attend.   Airelle Everding L Asah Lamay, LRT/CTRS         Cyndi Montejano L 02/27/2016 10:14 AM 
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
## 2016-02-27 NOTE — Progress Notes (Signed)
DAR NOTE: Pt present with flat affect and depressed mood in the unit. Pt has been isolating himself and has been bed most of the time.Pt stated " I am depressed, I feel like I am going to explode, the fact that I can't see my kids is driving me crazy." Pt denies physical pain, took all his meds as scheduled. As per self inventory, pt had a fair night sleep, fair appetite, low energy, and poor concentration. Pt's goal is " stay clean." Pt rate depression at 10, hopeless ness at 10, and anxiety at 10. Pt's safety ensured with 15 minute and environmental checks. Pt currently denies SI/HI and A/V hallucinations. Pt verbally agrees to seek staff if SI/HI or A/VH occurs and to consult with staff before acting on these thoughts. Will continue POC.

## 2016-02-27 NOTE — Progress Notes (Signed)
Adult Psychoeducational Group Note  Date:  02/27/2016 Time:  11:59 PM  Group Topic/Focus:  Wrap-Up Group:   The focus of this group is to help patients review their daily goal of treatment and discuss progress on daily workbooks.  Participation Level:  Did Not Attend  Participation Quality:  Drowsy  Affect:  Flat  Cognitive:  Lacking  Insight: None  Engagement in Group:  none   Modes of Intervention:  Support  Additional Comments:  Pt was not in group... Pt decided to stay in bed   Marguarite Markov R 02/27/2016, 11:59 PM
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
## 2016-02-27 NOTE — [facility] Group Notes (Signed)
[facility] LCSW Group Therapy 02/27/2016  1:15 PM   Type of Therapy: Group Therapy  Participation Level: Did Not Attend. Patient invited to participate but declined.   Juliann Olesky, MSW, LCSW Clinical Social Worker [facility] [phone]   

## 2016-02-27 NOTE — Tx Team (Signed)
Interdisciplinary Treatment Plan Update (Adult) Date: 02/27/2016    Time Reviewed: 9:30 AM  Progress in Treatment: Attending groups: Continuing to assess, patient new to milieu Participating in groups: Continuing to assess, patient new to milieu Taking medication as prescribed: Yes Tolerating medication: Yes Family/Significant other contact made: No, CSW assessing for appropriate contacts Patient understands diagnosis: Yes Discussing patient identified problems/goals with staff: Yes Medical problems stabilized or resolved: Yes Denies suicidal/homicidal ideation: Yes Issues/concerns per patient self-inventory: Yes Other:  New problem(s) identified: N/A  Discharge Plan or Barriers: CSW continuing to assess, patient new to milieu. Patient expresses interest in a residential treatment program  Reason for Continuation of Hospitalization:  Depression Anxiety Medication Stabilization   Comments: N/A  Estimated length of stay: 3-5 days    Patient is a 45 year old male with a diagnosis of Substance Induced Mood Disorder. Pt presented to the hospital with suicidal ideations and substance abuse. Pt reports primary trigger(s) for admission were financial and social stressors. Patient will benefit from crisis stabilization, medication evaluation, group therapy and psycho education in addition to case management for discharge planning. At discharge, it is recommended that Pt remain compliant with established discharge plan and continued treatment.   Review of initial/current patient goals per problem list:  1. Goal(s): Patient will participate in aftercare plan   Met: No   Target date: 3-5 days post admission date   As evidenced by: Patient will participate within aftercare plan AEB aftercare provider and housing plan at discharge being identified.   3/8: Goal not met: CSW assessing for appropriate referrals for pt and will have follow up secured prior to d/c.   2. Goal (s):  Patient will exhibit decreased depressive symptoms and suicidal ideations.   Met: No   Target date: 3-5 days post admission date   As evidenced by: Patient will utilize self rating of depression at 3 or below and demonstrate decreased signs of depression or be deemed stable for discharge by MD.   3/8: Patient rates depression at 10 today, denies SI.    3. Goal(s): Patient will demonstrate decreased signs and symptoms of anxiety.   Met: No   Target date: 3-5 days post admission date   As evidenced by: Patient will utilize self rating of anxiety at 3 or below and demonstrated decreased signs of anxiety, or be deemed stable for discharge by MD  3/8: Patient rates anxiety at 10.    4. Goal(s): Patient will demonstrate decreased signs of withdrawal due to substance abuse   Met: No   Target date: 3-5 days post admission date   As evidenced by: Patient will produce a CIWA/COWS score of 0, have stable vitals signs, and no symptoms of withdrawal  3/8: Patient with COWS score of 5, experiencing anxiety, aches, sweating.    Attendees: Patient:    Family:    Physician: Dr. Parke Poisson; Dr. Sabra Heck 02/27/2016 9:30 AM  Nursing: Eulogio Bear, Mayra Neer, Butler Denmark, RN 02/27/2016 9:30 AM  Clinical Social Worker: Tilden Fossa, LCSW 02/27/2016 9:30 AM  Other: Peri Maris, LCSWA; [place], LCSW  02/27/2016 9:30 AM  Other: Norberto Sorenson, P4CC 02/27/2016 9:30 AM  Other:  02/27/2016 9:30 AM  Other: May Malachi Carl NP 02/27/2016 9:30 AM  Other:    Other:    Other:    Other:     Scribe for Treatment Team:  Tilden Fossa, [place]

## 2016-02-27 NOTE — Tx Team (Addendum)
Initial Interdisciplinary Treatment Plan   PATIENT STRESSORS: Financial difficulties Health problems Legal issue Marital or family conflict Medication change or noncompliance Substance abuse   PATIENT STRENGTHS: Ability for insight Communication skills General fund of knowledge Supportive family/friends   PROBLEM LIST: Problem List/Patient Goals Date to be addressed Date deferred Reason deferred Estimated date of resolution  "I want to stop using drugs for my family." 02/27/2016     homelessness 02/27/2016     Depression 02/27/2016     Suicide Risk 02/27/2016                                    DISCHARGE CRITERIA:  Ability to meet basic life and health needs Adequate post-discharge living arrangements Improved stabilization in mood, thinking, and/or behavior Motivation to continue treatment in a less acute level of care Safe-care adequate arrangements made Withdrawal symptoms are absent or subacute and managed without 24-hour nursing intervention  PRELIMINARY DISCHARGE PLAN: Attend aftercare/continuing care group Attend PHP/IOP Attend 12-step recovery group Outpatient therapy Placement in alternative living arrangements  PATIENT/FAMIILY INVOLVEMENT: This treatment plan has been presented to and reviewed with the patient, David Doyle.  The patient and family have been given the opportunity to ask questions and make suggestions.  David Doyle, David Doyle 02/27/2016, 1:28 AM
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
## 2016-02-27 NOTE — Progress Notes (Signed)
D:Patient in hallway on first approach.  Patient appears flat and depressed.  Patient states he was upset earlier today after speaking to his children on the phone but states he spoke with his wife and now feels better.  Patient states he is having opiate and alcohol withdrawal symptoms. Patient denies SI/HI and denies AVH. A: Staff to monitor Q 15 mins for safety.  Encouragement and support offered.  Scheduled medications administered per orders. R: Patient remains safe on the unit.  Patient did not attend group tonight.  Patient visible on the unit for medications.  Patient taking administered medications.
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
## 2016-02-27 NOTE — [facility] Group Notes (Signed)
   [facility]  02/27/2016  8:45 AM   Participation Quality: Alert, Appropriate and Oriented  Mood/Affect: Depressed and Flat  Depression Rating: 10  Anxiety Rating: 10  Thoughts of Suicide: Pt denies SI/HI  Will you contract for safety? Yes  Current AVH: Pt denies  Plan for Discharge/Comments: Pt attended discharge planning group and actively participated in group. CSW provided pt with today's workbook. Patient expresses interest in a residential treatment center.  Transportation Means: CSW continuing to assess  Supports: No supports mentioned at this time  Samuella BruinKristin Polo Mcmartin, MSW, Johnson & JohnsonLCSW Clinical Social Worker [facility] [phone]

## 2016-02-28 ENCOUNTER — Encounter (HOSPITAL_COMMUNITY): Payer: Self-pay | Admitting: Psychiatry

## 2016-02-28 DIAGNOSIS — F1120 Opioid dependence, uncomplicated: Secondary | ICD-10-CM

## 2016-02-28 LAB — GLUCOSE, CAPILLARY
GLUCOSE-CAPILLARY: 220 mg/dL — AB (ref 65–99)
GLUCOSE-CAPILLARY: 263 mg/dL — AB (ref 65–99)
GLUCOSE-CAPILLARY: 326 mg/dL — AB (ref 65–99)
Glucose-Capillary: 179 mg/dL — ABNORMAL HIGH (ref 65–99)

## 2016-02-28 LAB — PROLACTIN: PROLACTIN: 11.1 ng/mL (ref 4.0–15.2)

## 2016-02-28 LAB — HEMOGLOBIN A1C
Hgb A1c MFr Bld: 11.9 % — ABNORMAL HIGH (ref 4.8–5.6)
MEAN PLASMA GLUCOSE: 295 mg/dL

## 2016-02-28 NOTE — Progress Notes (Signed)
Patient ID: David Doyle, male   DOB: 03-08-1971, 45 y.o.   MRN: [id number] Adult Psychoeducational Group Note  Date:  02/28/2016 Time: 09:00  Group Topic/Focus:  Self Esteem Action Plan:   The focus of this group is to help patients create a plan to continue to build self-esteem after discharge.  Participation Level:  Did Not Attend  Participation Quality: n/a  Affect: n/a  Cognitive: n/a  Insight: n/a  Engagement in Group: n/a  Modes of Intervention:  Activity, Discussion, Education and Support  Additional Comments:  Pt in bed asleep.   Aurora Maskwyman, Vaani Morren E 02/28/2016, 9:35 AM

## 2016-02-28 NOTE — Progress Notes (Signed)
Patient ID: David Doyle, male   DOB: [birth year], 45 y.o.   MRN: [id number]   Pt currently presents with a flat affect and guarded behavior. Pt reports symptoms of withdrawal including anxiety, nausea and cold chills. Pt affect brightens when he talks about the chance to speak with his children on the phone today.   Pt provided with medications per providers orders. Pt's labs and vitals were monitored throughout the day. Pt supported emotionally and encouraged to express concerns and questions. Pt educated on medications and the importance of medication adherence.   Pt's safety ensured with 15 minute and environmental checks. Pt currently denies SI/HI and A/V hallucinations. Pt verbally agrees to seek staff if SI/HI or A/VH occurs and to consult with staff before acting on these thoughts. Will continue POC.

## 2016-02-28 NOTE — Progress Notes (Signed)
[facility] MD Progress Note  02/28/2016 3:30 PM David Doyle  MRN:  [id number] Subjective:  States he cant continue to do this anymore. States his being able to be part of his kids lives depends on him doing this. States he has not been on a rehab before and he needs to do it. Still experiencing withdrawal.  Principal Problem: Alcohol abuse with alcohol-induced mental disorder [facility]) Diagnosis:   Patient Active Problem List   Diagnosis Date Noted  . 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 Alcohol dependence with uncomplicated withdrawal (HCC) [F10.230]   . Opioid type dependence, continuous (HCC) [F11.20] 01/03/2016  . Alcohol dependence (HCC) [F10.20] 01/03/2016  . PTSD (post-traumatic stress disorder) [F43.10] 01/03/2016  . Suicidal ideation [R45.851] 01/03/2016  . Severe recurrent major depression without psychotic features (HCC) [F33.2] 01/03/2016  . BACK PAIN [M54.9] 07/15/2011   Total Time spent with patient: 20 minutes  Past Psychiatric History: See admission H and P  Past Medical History:  Past Medical History  Diagnosis Date  . GERD (gastroesophageal reflux disease)   . Pancreatitis   . Diabetes mellitus without complication (HCC)   . Hypertension   . Depression   . Suicidal behavior   . Substance abuse   . PTSD (post-traumatic stress disorder)   . Bipolar 1 disorder (HCC)   . Portal vein thrombosis     Past Surgical History  Procedure Laterality Date  . Tonsillectomy    . Adenoidectomy     Family History: History reviewed. No pertinent family history. Family Psychiatric  History: see admission H and P Social History:  History  Alcohol Use  . Yes    Comment: occasionally     History  Drug Use  . Yes    Comment: crack, heroine, etc    Social History   Social History  . Marital Status: Single    Spouse Name: N/A  . Number of Children: N/A  . Years of Education: N/A   Social History Main Topics  . Smoking status:  Current Every Day Smoker -- 2.00 packs/day  . Smokeless tobacco: None  . Alcohol Use: Yes     Comment: occasionally  . Drug Use: Yes     Comment: crack, heroine, etc  . Sexual Activity: Yes   Other Topics Concern  . None   Social History Narrative   Additional Social History:                         Sleep: Poor  Appetite:  Fair  Current Medications: Current Facility-Administered Medications  Medication Dose Route Frequency Provider Last Rate Last Dose  . acetaminophen (TYLENOL) tablet 650 mg  650 mg Oral Q6H PRN Kerry Hough, PA-C      . alum & mag hydroxide-simeth (MAALOX/MYLANTA) 200-200-20 MG/5ML suspension 30 mL  30 mL Oral Q4H PRN Kerry Hough, PA-C      . atorvastatin (LIPITOR) tablet 40 mg  40 mg Oral Daily Kerry Hough, PA-C   40 mg at 02/28/16 0842  . cloNIDine (CATAPRES) tablet 0.1 mg  0.1 mg Oral QID Craige Cotta, MD   0.1 mg at 02/28/16 1212   Followed by  . [START ON 03/01/2016] cloNIDine (CATAPRES) tablet 0.1 mg  0.1 mg Oral BH-qamhs Craige Cotta, MD       Followed by  . [START ON 03/03/2016] cloNIDine (CATAPRES) tablet 0.1 mg  0.1 mg Oral QAC breakfast Fernando A Cobos,  MD      . dicyclomine (BENTYL) tablet 20 mg  20 mg Oral Q6H PRN Craige Cotta, MD      . hydrOXYzine (ATARAX/VISTARIL) tablet 25 mg  25 mg Oral Q6H PRN Kerry Hough, PA-C   25 mg at 02/27/16 0201  . insulin aspart (novoLOG) injection 0-20 Units  0-20 Units Subcutaneous TID WC Kerry Hough, PA-C   11 Units at 02/28/16 1215  . insulin aspart (novoLOG) injection 0-5 Units  0-5 Units Subcutaneous QHS Kerry Hough, PA-C   0 Units at 02/27/16 0145  . insulin detemir (LEVEMIR) injection 26 Units  26 Units Subcutaneous QHS Kerry Hough, PA-C   26 Units at 02/27/16 2255  . loperamide (IMODIUM) capsule 2-4 mg  2-4 mg Oral PRN Kerry Hough, PA-C      . LORazepam (ATIVAN) tablet 1 mg  1 mg Oral Q6H PRN Kerry Hough, PA-C      . LORazepam (ATIVAN) tablet 1 mg  1 mg  Oral TID Kerry Hough, PA-C       Followed by  . [START ON 02/29/2016] LORazepam (ATIVAN) tablet 1 mg  1 mg Oral BID Kerry Hough, PA-C       Followed by  . [START ON 03/02/2016] LORazepam (ATIVAN) tablet 1 mg  1 mg Oral Daily Spencer E Simon, PA-C      . losartan (COZAAR) tablet 50 mg  50 mg Oral Daily Kerry Hough, PA-C   50 mg at 02/28/16 0842  . magnesium hydroxide (MILK OF MAGNESIA) suspension 30 mL  30 mL Oral Daily PRN Kerry Hough, PA-C      . metFORMIN (GLUCOPHAGE) tablet 500 mg  500 mg Oral BID WC Kerry Hough, PA-C   500 mg at 02/28/16 0841  . methocarbamol (ROBAXIN) tablet 500 mg  500 mg Oral Q8H PRN Craige Cotta, MD      . multivitamin with minerals tablet 1 tablet  1 tablet Oral Daily Kerry Hough, PA-C   1 tablet at 02/28/16 1610  . naproxen (NAPROSYN) tablet 500 mg  500 mg Oral BID PRN Craige Cotta, MD      . nicotine (NICODERM CQ - dosed in mg/24 hours) patch 21 mg  21 mg Transdermal Daily Kerry Hough, PA-C   21 mg at 02/28/16 0840  . ondansetron (ZOFRAN-ODT) disintegrating tablet 4 mg  4 mg Oral Q6H PRN Craige Cotta, MD      . pantoprazole (PROTONIX) EC tablet 40 mg  40 mg Oral BID Kerry Hough, PA-C   40 mg at 02/28/16 0842  . QUEtiapine (SEROQUEL) tablet 100 mg  100 mg Oral QHS Kerry Hough, PA-C   100 mg at 02/27/16 2243  . sertraline (ZOLOFT) tablet 150 mg  150 mg Oral Daily Kerry Hough, PA-C   150 mg at 02/28/16 0841  . thiamine (B-1) injection 100 mg  100 mg Intramuscular Once Kerry Hough, PA-C   100 mg at 02/27/16 0145  . thiamine (VITAMIN B-1) tablet 100 mg  100 mg Oral Daily Kerry Hough, PA-C   100 mg at 02/28/16 9604  . topiramate (TOPAMAX) tablet 50 mg  50 mg Oral TID Kerry Hough, PA-C   50 mg at 02/28/16 1211    Lab Results:  Results for orders placed or performed during the hospital encounter of 02/27/16 (from the past 48 hour(s))  Glucose, capillary     Status: Abnormal  Collection Time: 02/27/16 12:49 AM   Result Value Ref Range   Glucose-Capillary 345 (H) 65 - 99 mg/dL  Glucose, capillary     Status: Abnormal   Collection Time: 02/27/16  6:11 AM  Result Value Ref Range   Glucose-Capillary 203 (H) 65 - 99 mg/dL  Hemoglobin N8G     Status: Abnormal   Collection Time: 02/27/16  6:20 AM  Result Value Ref Range   Hgb A1c MFr Bld 11.9 (H) 4.8 - 5.6 %    Comment: (NOTE)         Pre-diabetes: 5.7 - 6.4         Diabetes: >6.4         Glycemic control for adults with diabetes: <7.0    Mean Plasma Glucose 295 mg/dL    Comment: (NOTE) Performed At: [facility] [address] [place], [place] [id number] Mila Homer MD [id number] Performed at [facility]   Prolactin     Status: None   Collection Time: 02/27/16  6:20 AM  Result Value Ref Range   Prolactin 11.1 4.0 - 15.2 ng/mL    Comment: (NOTE) Performed At: [facility] [address] [place], [place] [id number] Mila Homer MD [id number] Performed at [facility]   TSH     Status: None   Collection Time: 02/27/16  6:20 AM  Result Value Ref Range   TSH 1.279 0.350 - 4.500 uIU/mL    Comment: Performed at [facility]  Lipid panel, fasting     Status: Abnormal   Collection Time: 02/27/16  6:20 AM  Result Value Ref Range   Cholesterol 288 (H) 0 - 200 mg/dL   Triglycerides 742 (H) <150 mg/dL   HDL 51 >59 mg/dL   Total CHOL/HDL Ratio 5.6 RATIO   VLDL 57 (H) 0 - 40 mg/dL   LDL Cholesterol 563 (H) 0 - 99 mg/dL    Comment:        Total Cholesterol/HDL:CHD Risk Coronary Heart Disease Risk Table                     Men   Women  1/2 Average Risk   3.4   3.3  Average Risk       5.0   4.4  2 X Average Risk   9.6   7.1  3 X Average Risk  23.4   11.0        Use the calculated Patient Ratio above and the CHD Risk Table to determine the patient's CHD Risk.        ATP III CLASSIFICATION (LDL):  <100     mg/dL   Optimal  875-643  mg/dL   Near or  Above                    Optimal  130-159  mg/dL   Borderline  329-518  mg/dL   High  >841     mg/dL   Very High Performed at [facility]   Glucose, capillary     Status: Abnormal   Collection Time: 02/27/16 11:19 AM  Result Value Ref Range   Glucose-Capillary 348 (H) 65 - 99 mg/dL   Comment 1 Notify RN    Comment 2 Document in Chart   Glucose, capillary     Status: Abnormal   Collection Time: 02/27/16  4:44 PM  Result Value Ref Range   Glucose-Capillary 256 (H) 65 - 99 mg/dL  Glucose, capillary     Status: Abnormal   Collection Time: 02/27/16  7:51 PM  Result Value Ref Range   Glucose-Capillary 183 (H) 65 - 99 mg/dL  Glucose, capillary     Status: Abnormal   Collection Time: 02/28/16  6:18 AM  Result Value Ref Range   Glucose-Capillary 220 (H) 65 - 99 mg/dL  Glucose, capillary     Status: Abnormal   Collection Time: 02/28/16 11:56 AM  Result Value Ref Range   Glucose-Capillary 263 (H) 65 - 99 mg/dL    Blood Alcohol level:  Lab Results  Component Value Date   ETH 172* 02/25/2016   ETH <5 01/02/2016    Physical Findings: AIMS: Facial and Oral Movements Muscles of Facial Expression: None, normal Lips and Perioral Area: None, normal Jaw: None, normal Tongue: None, normal,Extremity Movements Upper (arms, wrists, hands, fingers): None, normal Lower (legs, knees, ankles, toes): None, normal, Trunk Movements Neck, shoulders, hips: None, normal, Overall Severity Severity of abnormal movements (highest score from questions above): None, normal Incapacitation due to abnormal movements: None, normal Patient's awareness of abnormal movements (rate only patient's report): No Awareness, Dental Status Current problems with teeth and/or dentures?: Yes Does patient usually wear dentures?: No  CIWA:  CIWA-Ar Total: 2 COWS:  COWS Total Score: 5  Musculoskeletal: Strength & Muscle Tone: within normal limits Gait & Station: normal Patient leans: normal  Psychiatric  Specialty Exam: Review of Systems  Constitutional: Positive for malaise/fatigue and diaphoresis.  Eyes: Negative.   Respiratory: Negative.   Cardiovascular: Negative.   Genitourinary: Negative.   Musculoskeletal: Positive for back pain and joint pain.  Skin: Negative.   Neurological: Positive for tremors, weakness and headaches.  Endo/Heme/Allergies: Negative.   Psychiatric/Behavioral: Positive for depression and substance abuse. The patient is nervous/anxious and has insomnia.     Blood pressure 118/75, pulse 102, temperature 97.8 F (36.6 C), temperature source Oral, resp. rate 18, height 5\' 10"  (1.778 m), weight 87.998 kg (194 lb).Body mass index is 27.84 kg/(m^2).  General Appearance: Disheveled  Eye Contact::  Minimal  Speech:  Clear and Coherent and Slow  Volume:  Decreased  Mood:  Anxious and Depressed  Affect:  Depressed and Restricted  Thought Process:  Coherent and Goal Directed  Orientation:  Full (Time, Place, and Person)  Thought Content:  symptoms events worries concerns  Suicidal Thoughts:  No  Homicidal Thoughts:  No  Memory:  Immediate;   Fair Recent;   Fair Remote;   Fair  Judgement:  Fair  Insight:  Present and Shallow  Psychomotor Activity:  Restlessness  Concentration:  Fair  Recall:  [employer]Fair  Fund of Knowledge:Fair  Language: Fair  Akathisia:  No  Handed:  Right  AIMS (if indicated):     Assets:  Desire for Improvement  ADL's:  Intact  Cognition: WNL  Sleep:  Number of Hours: 6   Treatment Plan Summary: Daily contact with patient to assess and evaluate symptoms and progress in treatment and Medication management Supportive approach/coping skills Alcohol dependence; Ativan detox protocol/work a relapse prevention plan Opioid dependence; clonidine detox protocol/work a relapse prevention plan Depression: continue the Zoloft 150 mg and optimize response Ruminative thinking/mood instability; continue the Seroquel 100 mg HS Work with  CBT/mindfulness Explore residential treatment options Bettie Swavely A, MD 02/28/2016, 3:30 PM

## 2016-02-28 NOTE — Progress Notes (Signed)
Psychoeducational Group Note  Date:  02/28/2016 Time:  2100  Group Topic/Focus:  wrap up group  Participation Level: Did Not Attend  Participation Quality:  Not Applicable  Affect:  Not Applicable  Cognitive:  Not Applicable  Insight:  Not Applicable  Engagement in Group: Not Applicable  Additional Comments:  Pt was notified that group was beginning but remained in bed asleep.   Marcille BuffyMcNeil, Anner Baity S 02/28/2016, 10:25 PM

## 2016-02-28 NOTE — Progress Notes (Signed)
D:Patient in bed on first approach.  Patient states he is continuing to work on his substance abuse.  Patient states he has been sleeping a lot.  Patient states he needs to rest.  Patient taking administered medications.  Patient states he was happy because he was able to speak to his children on the phone today.  Patient denies SI/HI and denies AVH. A: Staff to monitor Q 15 mins for safety.  Encouragement and support offered.  Scheduled medications administered per orders. R: Patient remains safe on the unit.  Patient did not attend group tonight.  Patient available on the unit briefly and interacted with peers.  Patient taking administered medications.

## 2016-02-29 LAB — GLUCOSE, CAPILLARY
Glucose-Capillary: 244 mg/dL — ABNORMAL HIGH (ref 65–99)
Glucose-Capillary: 379 mg/dL — ABNORMAL HIGH (ref 65–99)
Glucose-Capillary: 357 mg/dL — ABNORMAL HIGH (ref 65–99)
Glucose-Capillary: 173 mg/dL — ABNORMAL HIGH (ref 65–99)

## 2016-02-29 NOTE — Progress Notes (Signed)
[facility] MD Progress Note  02/29/2016 1:17 PM David Doyle  MRN:  [id number] Subjective:  DSS separated the family. He went to jail for driving without a license. States the house they were living at was not appropriate. ( Has 5 kids 16 X2 13X 2 44 Y/O) Had been drinking. States when he went to the shelter people were drinking doing drugs. DSS asked that he goes trough treatment before he can see the kids Principal Problem: Severe recurrent major depression without psychotic features [facility]) Diagnosis:   Patient Active Problem List   Diagnosis Date Noted  . 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 Alcohol dependence with uncomplicated withdrawal (HCC) [F10.230]   . Opioid type dependence, continuous (HCC) [F11.20] 01/03/2016  . Alcohol dependence (HCC) [F10.20] 01/03/2016  . PTSD (post-traumatic stress disorder) [F43.10] 01/03/2016  . Suicidal ideation [R45.851] 01/03/2016  . Severe recurrent major depression without psychotic features (HCC) [F33.2] 01/03/2016  . BACK PAIN [M54.9] 07/15/2011   Total Time spent with patient: 20 minutes  Past Psychiatric History: [facility] week ago   Past Medical History:  Past Medical History  Diagnosis Date  . GERD (gastroesophageal reflux disease)   . Pancreatitis   . Diabetes mellitus without complication (HCC)   . Hypertension   . Depression   . Suicidal behavior   . Substance abuse   . PTSD (post-traumatic stress disorder)   . Bipolar 1 disorder (HCC)   . Portal vein thrombosis     Past Surgical History  Procedure Laterality Date  . Tonsillectomy    . Adenoidectomy     Family History: History reviewed. No pertinent family history. Family Psychiatric  History: Denies Social History:  History  Alcohol Use  . Yes    Comment: occasionally     History  Drug Use  . Yes    Comment: crack, heroine, etc    Social History   Social History  . Marital Status: Single    Spouse Name: N/A  .  Number of Children: N/A  . Years of Education: N/A   Social History Main Topics  . Smoking status: Current Every Day Smoker -- 2.00 packs/day  . Smokeless tobacco: None  . Alcohol Use: Yes     Comment: occasionally  . Drug Use: Yes     Comment: crack, heroine, etc  . Sexual Activity: Yes   Other Topics Concern  . None   Social History Narrative   Additional Social History:                         Sleep: Poor  Appetite:  Poor  Current Medications: Current Facility-Administered Medications  Medication Dose Route Frequency Provider Last Rate Last Dose  . acetaminophen (TYLENOL) tablet 650 mg  650 mg Oral Q6H PRN Kerry Hough, PA-C      . alum & mag hydroxide-simeth (MAALOX/MYLANTA) 200-200-20 MG/5ML suspension 30 mL  30 mL Oral Q4H PRN Kerry Hough, PA-C      . atorvastatin (LIPITOR) tablet 40 mg  40 mg Oral Daily Kerry Hough, PA-C   40 mg at 02/29/16 9562  . cloNIDine (CATAPRES) tablet 0.1 mg  0.1 mg Oral QID Craige Cotta, MD   0.1 mg at 02/29/16 1154   Followed by  . [START ON 03/01/2016] cloNIDine (CATAPRES) tablet 0.1 mg  0.1 mg Oral BH-qamhs Craige Cotta, MD       Followed by  . [  START ON 03/03/2016] cloNIDine (CATAPRES) tablet 0.1 mg  0.1 mg Oral QAC breakfast Rockey SituFernando A Cobos, MD      . dicyclomine (BENTYL) tablet 20 mg  20 mg Oral Q6H PRN Craige CottaFernando A Cobos, MD      . hydrOXYzine (ATARAX/VISTARIL) tablet 25 mg  25 mg Oral Q6H PRN Kerry HoughSpencer E Simon, PA-C   25 mg at 02/27/16 0201  . insulin aspart (novoLOG) injection 0-20 Units  0-20 Units Subcutaneous TID WC Kerry HoughSpencer E Simon, PA-C   20 Units at 02/29/16 1159  . insulin aspart (novoLOG) injection 0-5 Units  0-5 Units Subcutaneous QHS Kerry HoughSpencer E Simon, PA-C   0 Units at 02/27/16 0145  . insulin detemir (LEVEMIR) injection 26 Units  26 Units Subcutaneous QHS Kerry HoughSpencer E Simon, PA-C   26 Units at 02/28/16 2219  . loperamide (IMODIUM) capsule 2-4 mg  2-4 mg Oral PRN Kerry HoughSpencer E Simon, PA-C      . LORazepam  (ATIVAN) tablet 1 mg  1 mg Oral Q6H PRN Kerry HoughSpencer E Simon, PA-C      . LORazepam (ATIVAN) tablet 1 mg  1 mg Oral BID Kerry HoughSpencer E Simon, PA-C       Followed by  . [START ON 03/02/2016] LORazepam (ATIVAN) tablet 1 mg  1 mg Oral Daily Spencer E Simon, PA-C      . losartan (COZAAR) tablet 50 mg  50 mg Oral Daily Kerry HoughSpencer E Simon, PA-C   50 mg at 02/29/16 0825  . magnesium hydroxide (MILK OF MAGNESIA) suspension 30 mL  30 mL Oral Daily PRN Kerry HoughSpencer E Simon, PA-C      . metFORMIN (GLUCOPHAGE) tablet 500 mg  500 mg Oral BID WC Kerry HoughSpencer E Simon, PA-C   500 mg at 02/29/16 0824  . methocarbamol (ROBAXIN) tablet 500 mg  500 mg Oral Q8H PRN Craige CottaFernando A Cobos, MD      . multivitamin with minerals tablet 1 tablet  1 tablet Oral Daily Kerry HoughSpencer E Simon, PA-C   1 tablet at 02/29/16 [id number]  . naproxen (NAPROSYN) tablet 500 mg  500 mg Oral BID PRN Craige CottaFernando A Cobos, MD      . nicotine (NICODERM CQ - dosed in mg/24 hours) patch 21 mg  21 mg Transdermal Daily Kerry HoughSpencer E Simon, PA-C   21 mg at 02/29/16 0825  . ondansetron (ZOFRAN-ODT) disintegrating tablet 4 mg  4 mg Oral Q6H PRN Craige CottaFernando A Cobos, MD      . pantoprazole (PROTONIX) EC tablet 40 mg  40 mg Oral BID Kerry HoughSpencer E Simon, PA-C   40 mg at 02/29/16 0825  . QUEtiapine (SEROQUEL) tablet 100 mg  100 mg Oral QHS Kerry HoughSpencer E Simon, PA-C   100 mg at 02/28/16 2145  . sertraline (ZOLOFT) tablet 150 mg  150 mg Oral Daily Kerry HoughSpencer E Simon, PA-C   150 mg at 02/29/16 [id number]  . thiamine (B-1) injection 100 mg  100 mg Intramuscular Once Kerry HoughSpencer E Simon, PA-C   100 mg at 02/27/16 0145  . thiamine (VITAMIN B-1) tablet 100 mg  100 mg Oral Daily Kerry HoughSpencer E Simon, PA-C   100 mg at 02/29/16 [id number]  . topiramate (TOPAMAX) tablet 50 mg  50 mg Oral TID Kerry HoughSpencer E Simon, PA-C   50 mg at 02/29/16 1155    Lab Results:  Results for orders placed or performed during the hospital encounter of 02/27/16 (from the past 48 hour(s))  Glucose, capillary     Status: Abnormal   Collection Time: 02/27/16  4:44 PM   Result  Value Ref Range   Glucose-Capillary 256 (H) 65 - 99 mg/dL  Glucose, capillary     Status: Abnormal   Collection Time: 02/27/16  7:51 PM  Result Value Ref Range   Glucose-Capillary 183 (H) 65 - 99 mg/dL  Glucose, capillary     Status: Abnormal   Collection Time: 02/28/16  6:18 AM  Result Value Ref Range   Glucose-Capillary 220 (H) 65 - 99 mg/dL  Glucose, capillary     Status: Abnormal   Collection Time: 02/28/16 11:56 AM  Result Value Ref Range   Glucose-Capillary 263 (H) 65 - 99 mg/dL  Glucose, capillary     Status: Abnormal   Collection Time: 02/28/16  5:18 PM  Result Value Ref Range   Glucose-Capillary 326 (H) 65 - 99 mg/dL  Glucose, capillary     Status: Abnormal   Collection Time: 02/28/16  9:25 PM  Result Value Ref Range   Glucose-Capillary 179 (H) 65 - 99 mg/dL   Comment 1 Notify RN    Comment 2 Document in Chart   Glucose, capillary     Status: Abnormal   Collection Time: 02/29/16  6:18 AM  Result Value Ref Range   Glucose-Capillary 244 (H) 65 - 99 mg/dL   Comment 1 Notify RN    Comment 2 Document in Chart     Blood Alcohol level:  Lab Results  Component Value Date   ETH 172* 02/25/2016   ETH <5 01/02/2016    Physical Findings: AIMS: Facial and Oral Movements Muscles of Facial Expression: None, normal Lips and Perioral Area: None, normal Jaw: None, normal Tongue: None, normal,Extremity Movements Upper (arms, wrists, hands, fingers): None, normal Lower (legs, knees, ankles, toes): None, normal, Trunk Movements Neck, shoulders, hips: None, normal, Overall Severity Severity of abnormal movements (highest score from questions above): None, normal Incapacitation due to abnormal movements: None, normal Patient's awareness of abnormal movements (rate only patient's report): No Awareness, Dental Status Current problems with teeth and/or dentures?: Yes Does patient usually wear dentures?: No  CIWA:  CIWA-Ar Total: 0 COWS:  COWS Total Score:  4  Musculoskeletal: Strength & Muscle Tone: within normal limits Gait & Station: normal Patient leans: normal  Psychiatric Specialty Exam: Review of Systems  Constitutional: Positive for malaise/fatigue and diaphoresis.  HENT: Negative.   Eyes: Negative.   Respiratory: Positive for cough and shortness of breath.        2 packs   Cardiovascular: Positive for palpitations.  Gastrointestinal: Positive for heartburn, nausea and diarrhea.  Genitourinary: Positive for dysuria.  Musculoskeletal: Positive for back pain and joint pain.  Skin: Positive for itching and rash.  Neurological: Positive for dizziness, tremors and weakness.  Endo/Heme/Allergies: Negative.   Psychiatric/Behavioral: Positive for depression, suicidal ideas and substance abuse. The patient is nervous/anxious and has insomnia.     Blood pressure 97/68, pulse 97, temperature 97.8 F (36.6 C), temperature source Oral, resp. rate 16, height  (1.778 m), weight 87.998 kg (194 lb).Body mass index is 27.84 kg/(m^2).  General Appearance: Fairly Groomed  Patent attorney::  Fair  Speech:  Clear and Coherent  Volume:  Decreased  Mood:  Anxious and Depressed  Affect:  Restricted  Thought Process:  Coherent and Goal Directed  Orientation:  Full (Time, Place, and Person)  Thought Content:  symptoms events worries concerns  Suicidal Thoughts:  No  Homicidal Thoughts:  No  Memory:  Immediate;   Fair Recent;   Fair Remote;   Fair  Judgement:  Fair  Insight:  Present and Shallow  Psychomotor Activity:  Restlessness  Concentration:  Fair  Recall:  [employer] of Knowledge:Fair  Language: Fair  Akathisia:  No  Handed:  Right  AIMS (if indicated):     Assets:  Desire for Improvement  ADL's:  Intact  Cognition: WNL  Sleep:  Number of Hours: 6.25   Treatment Plan Summary: Daily contact with patient to assess and evaluate symptoms and progress in treatment and Medication management Supportive approach/coping  skills Alcohol dependence; Ativan detox protocol/work a relapse prevention plan Opioid dependence; clonidine detox protocol;work a relapse prevention plan Depression; continue the Zoloft 150 mg daily Mood instability; continue the Seroquel 100 mg daily Nightmares; will resume the Prazosin once the clonidine detox protocol is over Work with CBT/mindfulness Kiree Dejarnette A, MD 02/29/2016, 1:17 PM

## 2016-02-29 NOTE — Progress Notes (Signed)
DAR NOTE: Pt present with flat affect and depressed mood in the unit. Pt has been isolating himself and has been bed most of the time. Pt denies physical pain, took all his meds as scheduled. . Pt rate depression at 5, hopeless ness at 5, and anxiety at 5. Pt's safety ensured with 15 minute and environmental checks. Pt currently denies SI/HI and A/V hallucinations. Pt verbally agrees to seek staff if SI/HI or A/VH occurs and to consult with staff before acting on these thoughts. Will continue POC.

## 2016-02-29 NOTE — [facility] Group Notes (Signed)
[facility] LCSW Group Therapy 02/29/2016  1:15 PM   Type of Therapy: Group Therapy  Participation Level: Did Not Attend. Patient invited to participate but declined.   Maclovia Uher, MSW, LCSW Clinical Social Worker [facility] [phone]   

## 2016-02-29 NOTE — [facility] Group Notes (Signed)
[facility] LCSW Aftercare Discharge Planning Group Note  02/29/2016  8:45 AM  Participation Quality: Did Not Attend. Patient invited to participate but declined.  David Doyle, MSW, LCSW Clinical Social Worker [facility] [phone]   

## 2016-02-29 NOTE — Plan of Care (Signed)
Problem: Diagnosis: Increased Risk For Suicide Attempt Goal: LTG-Patient Will Report Improved Mood and Deny Suicidal LTG (by discharge) Patient will report improved mood and deny suicidal ideation.  Outcome: Progressing Patient states he is feel a little better after talking to his children on the phone.  Patient denies SI and verbally contracts for safety.

## 2016-02-29 NOTE — Progress Notes (Signed)
Recreation Therapy Notes   Date: 03.10.2017 Time: 9:30am Location: [address] Group Room   Group Topic: Stress Management  Goal Area(s) Addresses:  Patient will actively participate in stress management techniques presented during session.   Behavioral Response: Did not attend.    Ryann Pauli L Krista Som, LRT/CTRS       Mykela Mewborn L 02/29/2016 10:22 AM 

## 2016-03-01 DIAGNOSIS — F332 Major depressive disorder, recurrent severe without psychotic features: Secondary | ICD-10-CM

## 2016-03-01 LAB — GLUCOSE, CAPILLARY
GLUCOSE-CAPILLARY: 185 mg/dL — AB (ref 65–99)
Glucose-Capillary: 268 mg/dL — ABNORMAL HIGH (ref 65–99)
Glucose-Capillary: 331 mg/dL — ABNORMAL HIGH (ref 65–99)
Glucose-Capillary: 193 mg/dL — ABNORMAL HIGH (ref 65–99)

## 2016-03-01 NOTE — Progress Notes (Signed)
Patient met sleeping at the time of taking over from the outgoing nurse @ 23:00. Respiration even and unlabored. Every 15 minutes check for safety maintained. Will continue to monitor patient for safety and stability.

## 2016-03-01 NOTE — [facility] Group Notes (Signed)
[facility] Group Notes: (Clinical Social Work)   03/01/2016      Type of Therapy:  Group Therapy   Participation Level:  Did Not Attend despite MHT prompting   Kaytlynn Kochan Grossman-Orr, LCSW 03/01/2016, 12:45 PM     

## 2016-03-01 NOTE — Progress Notes (Signed)
D:Pt presents with depressed affect. Per patient self inventory form pt reports he slept fair last night. Pt reports a good appetite, low energy level, poor concentration. Pt rates depression 10/10, hopelessness 10/10, anxiety 10/10- all on 0-10 scale, 10 being the worse. Pt denies SI/HI. Denies AVH. Pt reports his goal is "Staying clean."  A:Special checks q 15 mins in place for safety. Medication administered per MD order (See eMAR) Encouragement and support provided.  R:safety maintained. Compliant with medication regimen. Will continue to monitor.

## 2016-03-01 NOTE — Progress Notes (Signed)
[facility] MD Progress Note  03/01/2016 6:48 PM David Doyle  MRN:  [id number] Subjective: Patient " I am okay."  Objective; David Doyle is awake, alert and oriented X3 , found resting in bedroom with a flat affect.  Denies suicidal or homicidal ideation. Denies auditory or visual hallucination and does not appear to be responding to internal stimuli.  Patient reports he is medication compliant without mediation side effects.  States his depression 10/10. Patient doesn't appear to be engaged thorough this discussion. Patient reports fair appetite and states he is  resting well.  Support, encouragement and reassurance was provided.   Principal Problem: Severe recurrent major depression without psychotic features (HCC) Diagnosis:   Patient Active Problem List   Diagnosis Date Noted  . 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 Alcohol dependence with uncomplicated withdrawal (HCC) [F10.230]   . Opioid type dependence, continuous (HCC) [F11.20] 01/03/2016  . Alcohol dependence (HCC) [F10.20] 01/03/2016  . PTSD (post-traumatic stress disorder) [F43.10] 01/03/2016  . Suicidal ideation [R45.851] 01/03/2016  . Severe recurrent major depression without psychotic features (HCC) [F33.2] 01/03/2016  . BACK PAIN [M54.9] 07/15/2011   Total Time spent with patient: 20 minutes  Past Psychiatric History: [facility] week ago   Past Medical History:  Past Medical History  Diagnosis Date  . GERD (gastroesophageal reflux disease)   . Pancreatitis   . Diabetes mellitus without complication (HCC)   . Hypertension   . Depression   . Suicidal behavior   . Substance abuse   . PTSD (post-traumatic stress disorder)   . Bipolar 1 disorder (HCC)   . Portal vein thrombosis     Past Surgical History  Procedure Laterality Date  . Tonsillectomy    . Adenoidectomy     Family History: History reviewed. No pertinent family history. Family Psychiatric  History:  Denies Social History:  History  Alcohol Use  . Yes    Comment: occasionally     History  Drug Use  . Yes    Comment: crack, heroine, etc    Social History   Social History  . Marital Status: Single    Spouse Name: N/A  . Number of Children: N/A  . Years of Education: N/A   Social History Main Topics  . Smoking status: Current Every Day Smoker -- 2.00 packs/day  . Smokeless tobacco: None  . Alcohol Use: Yes     Comment: occasionally  . Drug Use: Yes     Comment: crack, heroine, etc  . Sexual Activity: Yes   Other Topics Concern  . None   Social History Narrative   Additional Social History:                         Sleep: Poor  Appetite:  Poor  Current Medications: Current Facility-Administered Medications  Medication Dose Route Frequency Provider Last Rate Last Dose  . acetaminophen (TYLENOL) tablet 650 mg  650 mg Oral Q6H PRN Kerry Hough, PA-C   650 mg at 03/01/16 1603  . alum & mag hydroxide-simeth (MAALOX/MYLANTA) 200-200-20 MG/5ML suspension 30 mL  30 mL Oral Q4H PRN Kerry Hough, PA-C      . atorvastatin (LIPITOR) tablet 40 mg  40 mg Oral Daily Kerry Hough, PA-C   40 mg at 03/01/16 0857  . cloNIDine (CATAPRES) tablet 0.1 mg  0.1 mg Oral BH-qamhs Rockey Situ Cobos, MD   0.1 mg at 03/01/16 [phone]  Followed by  . [START ON 03/03/2016] cloNIDine (CATAPRES) tablet 0.1 mg  0.1 mg Oral QAC breakfast Rockey Situ Cobos, MD      . dicyclomine (BENTYL) tablet 20 mg  20 mg Oral Q6H PRN Rockey Situ Cobos, MD      . insulin aspart (novoLOG) injection 0-20 Units  0-20 Units Subcutaneous TID WC Kerry Hough, PA-C   15 Units at 03/01/16 1722  . insulin aspart (novoLOG) injection 0-5 Units  0-5 Units Subcutaneous QHS Kerry Hough, PA-C   0 Units at 02/27/16 0145  . insulin detemir (LEVEMIR) injection 26 Units  26 Units Subcutaneous QHS Kerry Hough, PA-C   26 Units at 02/29/16 2105  . [START ON 03/02/2016] LORazepam (ATIVAN) tablet 1 mg  1 mg Oral  Daily Spencer E Simon, PA-C      . losartan (COZAAR) tablet 50 mg  50 mg Oral Daily Kerry Hough, PA-C   50 mg at 03/01/16 0857  . magnesium hydroxide (MILK OF MAGNESIA) suspension 30 mL  30 mL Oral Daily PRN Kerry Hough, PA-C      . metFORMIN (GLUCOPHAGE) tablet 500 mg  500 mg Oral BID WC Kerry Hough, PA-C   500 mg at 03/01/16 1603  . methocarbamol (ROBAXIN) tablet 500 mg  500 mg Oral Q8H PRN Craige Cotta, MD      . multivitamin with minerals tablet 1 tablet  1 tablet Oral Daily Kerry Hough, PA-C   1 tablet at 03/01/16 0857  . naproxen (NAPROSYN) tablet 500 mg  500 mg Oral BID PRN Craige Cotta, MD      . nicotine (NICODERM CQ - dosed in mg/24 hours) patch 21 mg  21 mg Transdermal Daily Kerry Hough, PA-C   21 mg at 02/29/16 0825  . ondansetron (ZOFRAN-ODT) disintegrating tablet 4 mg  4 mg Oral Q6H PRN Craige Cotta, MD      . pantoprazole (PROTONIX) EC tablet 40 mg  40 mg Oral BID Kerry Hough, PA-C   40 mg at 03/01/16 1602  . QUEtiapine (SEROQUEL) tablet 100 mg  100 mg Oral QHS Kerry Hough, PA-C   100 mg at 02/29/16 2102  . sertraline (ZOLOFT) tablet 150 mg  150 mg Oral Daily Kerry Hough, PA-C   150 mg at 03/01/16 0857  . thiamine (B-1) injection 100 mg  100 mg Intramuscular Once Kerry Hough, PA-C   100 mg at 02/27/16 0145  . thiamine (VITAMIN B-1) tablet 100 mg  100 mg Oral Daily Kerry Hough, PA-C   100 mg at 03/01/16 0857  . topiramate (TOPAMAX) tablet 50 mg  50 mg Oral TID Kerry Hough, PA-C   50 mg at 03/01/16 1603    Lab Results:  Results for orders placed or performed during the hospital encounter of 02/27/16 (from the past 48 hour(s))  Glucose, capillary     Status: Abnormal   Collection Time: 02/28/16  9:25 PM  Result Value Ref Range   Glucose-Capillary 179 (H) 65 - 99 mg/dL   Comment 1 Notify RN    Comment 2 Document in Chart   Glucose, capillary     Status: Abnormal   Collection Time: 02/29/16  6:18 AM  Result Value Ref Range    Glucose-Capillary 244 (H) 65 - 99 mg/dL   Comment 1 Notify RN    Comment 2 Document in Chart   Glucose, capillary     Status: Abnormal   Collection  Time: 02/29/16 11:44 AM  Result Value Ref Range   Glucose-Capillary 379 (H) 65 - 99 mg/dL   Comment 1 Notify RN   Glucose, capillary     Status: Abnormal   Collection Time: 02/29/16  5:08 PM  Result Value Ref Range   Glucose-Capillary 357 (H) 65 - 99 mg/dL   Comment 1 Notify RN   Glucose, capillary     Status: Abnormal   Collection Time: 02/29/16  8:52 PM  Result Value Ref Range   Glucose-Capillary 173 (H) 65 - 99 mg/dL  Glucose, capillary     Status: Abnormal   Collection Time: 03/01/16  6:14 AM  Result Value Ref Range   Glucose-Capillary 185 (H) 65 - 99 mg/dL  Glucose, capillary     Status: Abnormal   Collection Time: 03/01/16 11:52 AM  Result Value Ref Range   Glucose-Capillary 268 (H) 65 - 99 mg/dL  Glucose, capillary     Status: Abnormal   Collection Time: 03/01/16  5:06 PM  Result Value Ref Range   Glucose-Capillary 331 (H) 65 - 99 mg/dL    Blood Alcohol level:  Lab Results  Component Value Date   ETH 172* 02/25/2016   ETH <5 01/02/2016    Physical Findings: AIMS: Facial and Oral Movements Muscles of Facial Expression: None, normal Lips and Perioral Area: None, normal Jaw: None, normal Tongue: None, normal,Extremity Movements Upper (arms, wrists, hands, fingers): None, normal Lower (legs, knees, ankles, toes): None, normal, Trunk Movements Neck, shoulders, hips: None, normal, Overall Severity Severity of abnormal movements (highest score from questions above): None, normal Incapacitation due to abnormal movements: None, normal Patient's awareness of abnormal movements (rate only patient's report): No Awareness, Dental Status Current problems with teeth and/or dentures?: Yes Does patient usually wear dentures?: No  CIWA:  CIWA-Ar Total: 3 COWS:  COWS Total Score: 2  Musculoskeletal: Strength & Muscle Tone:  within normal limits Gait & Station: normal Patient leans: normal  Psychiatric Specialty Exam: Review of Systems  HENT: Negative.   Eyes: Negative.   Respiratory:       2 packs   Musculoskeletal: Positive for back pain and joint pain.  Endo/Heme/Allergies: Negative.   Psychiatric/Behavioral: Positive for depression, suicidal ideas and substance abuse. The patient is nervous/anxious and has insomnia.   All other systems reviewed and are negative.   Blood pressure 111/70, pulse 84, temperature 98.6 F (37 C), temperature source Oral, resp. rate 16, height  (1.778 m), weight 87.998 kg (194 lb).Body mass index is 27.84 kg/(m^2).  General Appearance: Fairly Groomed  Patent attorney::  Fair  Speech:  Clear and Coherent  Volume:  Decreased  Mood:  Anxious, Depressed and Irritable  Affect:  Depressed, Flat and Restricted  Thought Process:  Coherent and Goal Directed  Orientation:  Full (Time, Place, and Person)  Thought Content:  symptoms events worries concerns  Suicidal Thoughts:  No  Homicidal Thoughts:  No  Memory:  Immediate;   Fair Recent;   Fair Remote;   Fair  Judgement:  Fair  Insight:  Present and Shallow  Psychomotor Activity:  Restlessness  Concentration:  Fair  Recall:  [employer] of Knowledge:Fair  Language: Fair  Akathisia:  No  Handed:  Right  AIMS (if indicated):     Assets:  Desire for Improvement  ADL's:  Intact  Cognition: WNL  Sleep:  Number of Hours: 6.25     I agree with current treatment plan on 03/01/2016, Patient seen face-to-face for psychiatric evaluation follow-up, chart  reviewed.  Reviewed the information documented and agree with the treatment plan.  Treatment Plan Summary: Daily contact with patient to assess and evaluate symptoms and progress in treatment and Medication management Supportive approach/coping skills Alcohol dependence; Ativan detox protocol/work a relapse prevention plan Opioid dependence; clonidine detox protocol;work a  relapse prevention plan Depression; continue the Zoloft 150 mg daily Mood instability; continue the Seroquel 100 mg daily Nightmares; will resume the Prazosin once the clonidine detox protocol is over Work with CBT/mindfulness  Oneta Rackanika N Lewis, NP 03/01/2016, 6:48 PM  Patient seen face to face for psychiatric evaluation. Chart reviewed and finding discussed with Physician extender. Agreed with disposition and treatment plan.   Kathryne SharperSyed Jamine Wingate, MD

## 2016-03-01 NOTE — [facility] Group Notes (Signed)
[facility] Group Notes:  (Nursing/MHT/Case Management/Adjunct)  Date:  03/01/2016  Time: 1300  Type of Therapy:  Nurse Education  Participation Level:  Did Not Attend   Dara Hoyershley N Mikaele Stecher 03/01/2016, 4:31 PM

## 2016-03-02 ENCOUNTER — Encounter (HOSPITAL_COMMUNITY): Payer: Self-pay | Admitting: Registered Nurse

## 2016-03-02 LAB — GLUCOSE, CAPILLARY
GLUCOSE-CAPILLARY: 331 mg/dL — AB (ref 65–99)
GLUCOSE-CAPILLARY: 276 mg/dL — AB (ref 65–99)
Glucose-Capillary: 251 mg/dL — ABNORMAL HIGH (ref 65–99)
Glucose-Capillary: 204 mg/dL — ABNORMAL HIGH (ref 65–99)

## 2016-03-02 NOTE — [facility] Group Notes (Signed)
[facility] Group Notes:  (Nursing/MHT/Case Management/Adjunct)  Date:  03/02/2016  Time:  10:29 AM  Type of Therapy:  Psychoeducational Skills  Participation Level:  Did Not Attend  Participation Quality:  N/A  Affect:  N/A  Cognitive:  N/A  Insight:  None  Engagement in Group:  None  Modes of Intervention:  Discussion and Education  Summary of Progress/Problems: Patient was invited but did not attend.   Nathanel Tallman E 03/02/2016, 10:29 AM 

## 2016-03-02 NOTE — Progress Notes (Signed)
Patient ID: David Doyle, male   DOB: July 09, 1971, 45 y.o.   MRN: [id number]  DAR: Pt. Denies SI/HI and A/V Hallucinations. He reports sleep is good, appetite is good, energy level is low, and concentration is poor. He rates depression and hopelessness 8/10 and anxiety 10/10 however his presentation does not appear anxious. Patient does not report any pain at this time. However, does continue to report withdrawal symptoms including agitation, cravings, irritability, tremors, and runny nose. Support and encouragement provided to the patient. Scheduled medications administered to patient per physician's orders however they were late due to patient being non-cooperative and refusing to come to med window. He slept through much of the morning. He remains isolative staying in his room for most of the morning although encouraged to participate. Q15 minute checks are maintained for safety.

## 2016-03-02 NOTE — Progress Notes (Signed)
D: Patient reported that he wants to do is go to sleep and that he will try to interact more with peers in am. He rated his day 7/10 and is in no pain. He denies SI, HI, and AVH.   A: Safety performed q 15 minutes, observation maintained, and encourage/support offered.   R: Patient receptive and complaint with treatment. Will continue to monitor.

## 2016-03-02 NOTE — Progress Notes (Signed)
[facility]  03/02/2016 1:42 PM David Doyle  MRN:  [id number] Subjective: Patient " I just don't want to be around people"  Objective:  Patient seems by this provider, case reviewed with social worker and nursing.  On evaluation:  David Doyle is in the bed.  States that he did not go to the group sessions this morning because he did not feel like being around people. Reports that he continues to have withdrawal symptoms of chills, sweating, nausea, and diarrhea.  Informed that he had medication would need to ask his nurse.  States that he is sleeping/eating without difficulty and tolerating medications without adverse reactions.  States that he does attend some groups but not all.  Patient also reports that his suicidal thoughts have lessened since admissions last thoughts were last night but states that he has no plan and would not hurt himself while in the hospital.    Principal Problem: Severe recurrent major depression without psychotic features [facility](HCC) Diagnosis:   Patient Active Problem List   Diagnosis Date Noted  . 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 Alcohol dependence with uncomplicated withdrawal (HCC) [F10.230]   . Opioid type dependence, continuous (HCC) [F11.20] 01/03/2016  . Alcohol dependence (HCC) [F10.20] 01/03/2016  . PTSD (post-traumatic stress disorder) [F43.10] 01/03/2016  . Suicidal ideation [R45.851] 01/03/2016  . Severe recurrent major depression without psychotic features (HCC) [F33.2] 01/03/2016  . BACK PAIN [M54.9] 07/15/2011   Total Time spent with patient: 20 minutes  Past Psychiatric History: [facility] week ago   Past Medical History:  Past Medical History  Diagnosis Date  . GERD (gastroesophageal reflux disease)   . Pancreatitis   . Diabetes mellitus without complication (HCC)   . Hypertension   . Depression   . Suicidal behavior   . Substance abuse   . PTSD (post-traumatic  stress disorder)   . Bipolar 1 disorder (HCC)   . Portal vein thrombosis     Past Surgical History  Procedure Laterality Date  . Tonsillectomy    . Adenoidectomy     Family History: History reviewed. No pertinent family history. Family Psychiatric  History: Denies Social History:  History  Alcohol Use  . Yes    Comment: occasionally     History  Drug Use  . Yes    Comment: crack, heroine, etc    Social History   Social History  . Marital Status: Single    Spouse Name: N/A  . Number of Children: N/A  . Years of Education: N/A   Social History Main Topics  . Smoking status: Current Every Day Smoker -- 2.00 packs/day  . Smokeless tobacco: None  . Alcohol Use: Yes     Comment: occasionally  . Drug Use: Yes     Comment: crack, heroine, etc  . Sexual Activity: Yes   Other Topics Concern  . None   Social History Narrative   Additional Social History:   Sleep: Fair, Improving  Appetite:  Fair, Improving  Current Medications: Current Facility-Administered Medications  Medication Dose Route Frequency Provider Last Rate Last Dose  . acetaminophen (TYLENOL) tablet 650 mg  650 mg Oral Q6H PRN Kerry HoughSpencer E Simon, PA-C   650 mg at 03/01/16 1603  . alum & mag hydroxide-simeth (MAALOX/MYLANTA) 200-200-20 MG/5ML suspension 30 mL  30 mL Oral Q4H PRN Kerry HoughSpencer E Simon, PA-C      . atorvastatin (LIPITOR) tablet 40 mg  40 mg Oral Daily  Kerry Hough, PA-C   40 mg at 03/02/16 1008  . cloNIDine (CATAPRES) tablet 0.1 mg  0.1 mg Oral BH-qamhs Rockey Situ Cobos, MD   0.1 mg at 03/01/16 2126   Followed by  . [START ON 03/03/2016] cloNIDine (CATAPRES) tablet 0.1 mg  0.1 mg Oral QAC breakfast Rockey Situ Cobos, MD      . dicyclomine (BENTYL) tablet 20 mg  20 mg Oral Q6H PRN Rockey Situ Cobos, MD      . insulin aspart (novoLOG) injection 0-20 Units  0-20 Units Subcutaneous TID WC Kerry Hough, PA-C   15 Units at 03/02/16 1210  . insulin aspart (novoLOG) injection 0-5 Units  0-5 Units  Subcutaneous QHS Kerry Hough, PA-C   0 Units at 02/27/16 0145  . insulin detemir (LEVEMIR) injection 26 Units  26 Units Subcutaneous QHS Kerry Hough, PA-C   26 Units at 03/01/16 2126  . losartan (COZAAR) tablet 50 mg  50 mg Oral Daily Kerry Hough, PA-C   50 mg at 03/01/16 0857  . magnesium hydroxide (MILK OF MAGNESIA) suspension 30 mL  30 mL Oral Daily PRN Kerry Hough, PA-C      . metFORMIN (GLUCOPHAGE) tablet 500 mg  500 mg Oral BID WC Kerry Hough, PA-C   500 mg at 03/02/16 1009  . methocarbamol (ROBAXIN) tablet 500 mg  500 mg Oral Q8H PRN Craige Cotta, MD      . multivitamin with minerals tablet 1 tablet  1 tablet Oral Daily Kerry Hough, PA-C   1 tablet at 03/02/16 1009  . naproxen (NAPROSYN) tablet 500 mg  500 mg Oral BID PRN Craige Cotta, MD      . nicotine (NICODERM CQ - dosed in mg/24 hours) patch 21 mg  21 mg Transdermal Daily Kerry Hough, PA-C   21 mg at 02/29/16 0825  . ondansetron (ZOFRAN-ODT) disintegrating tablet 4 mg  4 mg Oral Q6H PRN Craige Cotta, MD      . pantoprazole (PROTONIX) EC tablet 40 mg  40 mg Oral BID Kerry Hough, PA-C   40 mg at 03/02/16 1008  . QUEtiapine (SEROQUEL) tablet 100 mg  100 mg Oral QHS Kerry Hough, PA-C   100 mg at 03/01/16 2126  . sertraline (ZOLOFT) tablet 150 mg  150 mg Oral Daily Kerry Hough, PA-C   150 mg at 03/02/16 1009  . thiamine (B-1) injection 100 mg  100 mg Intramuscular Once Kerry Hough, PA-C   100 mg at 02/27/16 0145  . thiamine (VITAMIN B-1) tablet 100 mg  100 mg Oral Daily Kerry Hough, PA-C   100 mg at 03/02/16 1008  . topiramate (TOPAMAX) tablet 50 mg  50 mg Oral TID Kerry Hough, PA-C   50 mg at 03/02/16 1008    Lab Results:  Results for orders placed or performed during the hospital encounter of 02/27/16 (from the past 48 hour(s))  Glucose, capillary     Status: Abnormal   Collection Time: 02/29/16  5:08 PM  Result Value Ref Range   Glucose-Capillary 357 (H) 65 - 99 mg/dL    Comment 1 Notify RN   Glucose, capillary     Status: Abnormal   Collection Time: 02/29/16  8:52 PM  Result Value Ref Range   Glucose-Capillary 173 (H) 65 - 99 mg/dL  Glucose, capillary     Status: Abnormal   Collection Time: 03/01/16  6:14 AM  Result Value Ref Range  Glucose-Capillary 185 (H) 65 - 99 mg/dL  Glucose, capillary     Status: Abnormal   Collection Time: 03/01/16 11:52 AM  Result Value Ref Range   Glucose-Capillary 268 (H) 65 - 99 mg/dL  Glucose, capillary     Status: Abnormal   Collection Time: 03/01/16  5:06 PM  Result Value Ref Range   Glucose-Capillary 331 (H) 65 - 99 mg/dL  Glucose, capillary     Status: Abnormal   Collection Time: 03/01/16  9:09 PM  Result Value Ref Range   Glucose-Capillary 193 (H) 65 - 99 mg/dL   Comment 1 Notify RN    Comment 2 Document in Chart   Glucose, capillary     Status: Abnormal   Collection Time: 03/02/16  6:10 AM  Result Value Ref Range   Glucose-Capillary 251 (H) 65 - 99 mg/dL  Glucose, capillary     Status: Abnormal   Collection Time: 03/02/16 11:54 AM  Result Value Ref Range   Glucose-Capillary 331 (H) 65 - 99 mg/dL   Comment 1 Notify RN     Blood Alcohol level:  Lab Results  Component Value Date   ETH 172* 02/25/2016   ETH <5 01/02/2016    Physical Findings: AIMS: Facial and Oral Movements Muscles of Facial Expression: None, normal Lips and Perioral Area: None, normal Jaw: None, normal Tongue: None, normal,Extremity Movements Upper (arms, wrists, hands, fingers): None, normal Lower (legs, knees, ankles, toes): None, normal, Trunk Movements Neck, shoulders, hips: None, normal, Overall Severity Severity of abnormal movements (highest score from questions above): None, normal Incapacitation due to abnormal movements: None, normal Patient's awareness of abnormal movements (rate only patient's report): No Awareness, Dental Status Current problems with teeth and/or dentures?: Yes Does patient usually wear dentures?:  No  CIWA:  CIWA-Ar Total: 3 COWS:  COWS Total Score: 1  Musculoskeletal: Strength & Muscle Tone: within normal limits Gait & Station: normal Patient leans: normal  Psychiatric Specialty Exam: Review of Systems  Constitutional: Positive for diaphoresis.  Respiratory:       2 packs   Musculoskeletal: Positive for back pain and joint pain.  Endo/Heme/Allergies: Negative.   Psychiatric/Behavioral: Positive for depression, suicidal ideas and substance abuse. The patient is nervous/anxious and has insomnia.   All other systems reviewed and are negative.   Blood pressure 97/69, pulse 76, temperature 98.5 F (36.9 C), temperature source Oral, resp. rate 16, height 5\' 10"  (1.778 m), weight 87.998 kg (194 lb).Body mass index is 27.84 kg/(m^2).  General Appearance: Disheveled and Fairly Groomed  Patent attorney::  Fair  Speech:  Clear and Coherent  Volume:  Decreased  Mood:  Anxious and Depressed  Affect:  Depressed, Flat and Restricted  Thought Process:  Coherent and Goal Directed  Orientation:  Full (Time, Place, and Person)  Thought Content:  symptoms events worries concerns  Suicidal Thoughts:  Passive; no plan or intent  Homicidal Thoughts:  No  Memory:  Immediate;   Fair Recent;   Fair Remote;   Fair  Judgement:  Fair  Insight:  Lacking and Shallow  Psychomotor Activity:  Normal  Concentration:  Fair  Recall:  [employer] of Knowledge:Fair  Language: Fair  Akathisia:  No  Handed:  Right  AIMS (if indicated):     Assets:  Desire for Improvement  ADL's:  Intact  Cognition: WNL  Sleep:  Number of Hours: 4.25     I agree with current treatment plan on 03/01/2016, Patient seen face-to-face for psychiatric evaluation follow-up, chart reviewed.  Reviewed the information documented and agree with the treatment plan.  Treatment Plan Summary: Daily contact with patient to assess and evaluate symptoms and progress in treatment and Medication management Supportive approach/coping  skills Continue Q 15 minute observation checks for safety Alcohol dependence: Continue Ativan detox protocol/work a relapse prevention plan Opioid dependence Continue clonidine detox protocol;work a relapse prevention plan Depression; continue the Zoloft 150 mg daily Mood Instability: continue the Seroquel 100 mg daily Nightmares; Continue Prazosin once the clonidine detox protocol is over Work with CBT/mindfulness  Rankin, Shuvon, NP 03/02/2016, 1:42 PM .   I reviewed chart and agreed with the findings and treatment Plan.  Kathryne Sharper, MD

## 2016-03-02 NOTE — Progress Notes (Signed)
Patient did not attend the evening speaker AA meeting. Pt was notified that group was beginning but remained in bed.   

## 2016-03-02 NOTE — [facility] Group Notes (Signed)
[facility] Group Notes:  (Clinical Social Work)  03/02/2016  10:00-11:00AM  Summary of Progress/Problems:   The main focus of today's process group was to   1)  discuss the importance of adding supports  2)  define health supports versus unhealthy supports  3)  identify the patient's current unhealthy supports and plan how to handle them  4)  Identify the patient's current healthy supports and plan what to add.  An emphasis was placed on using counselor, doctor, therapy groups, 12-step groups, and problem-specific support groups to expand supports.    The patient was late to group, then was only in the group for part of it, left after awhile although while present he interacted very well.  Type of Therapy:  Process Group with Motivational Interviewing  Participation Level:  Minimal  Participation Quality:  Attentive  Affect:  Blunted  Cognitive:  Appropriate  Insight:  Improving  Engagement in Therapy: Improving  Modes of Intervention:   Education, Support and Processing, Activity  Ambrose MantleMareida Grossman-Orr, LCSW 03/02/2016

## 2016-03-02 NOTE — Plan of Care (Signed)
Problem: Alteration in mood Goal: STG-Patient reports thoughts of self-harm to staff Outcome: Progressing David Doyle denies SI to this Clinical research associatewriter during shift assessment.

## 2016-03-03 LAB — GLUCOSE, CAPILLARY: Glucose-Capillary: 200 mg/dL — ABNORMAL HIGH (ref 65–99)

## 2016-03-03 MED ORDER — ATORVASTATIN CALCIUM 40 MG PO TABS: 40.0000 mg | ORAL_TABLET | Freq: Every day | ORAL | Status: DC

## 2016-03-03 MED ORDER — INSULIN DETEMIR 100 UNIT/ML ~~LOC~~ SOLN: 26.0000 [IU] | Freq: Every day | SUBCUTANEOUS | Status: DC

## 2016-03-03 MED ORDER — PANTOPRAZOLE SODIUM 40 MG PO TBEC: 40.0000 mg | DELAYED_RELEASE_TABLET | Freq: Two times a day (BID) | ORAL | Status: DC

## 2016-03-03 MED ORDER — LOSARTAN POTASSIUM 50 MG PO TABS: 50.0000 mg | ORAL_TABLET | Freq: Every day | ORAL | Status: DC

## 2016-03-03 MED ORDER — METFORMIN HCL 500 MG PO TABS: 500.0000 mg | ORAL_TABLET | Freq: Two times a day (BID) | ORAL | Status: DC

## 2016-03-03 MED ORDER — TOPIRAMATE 50 MG PO TABS: 50.0000 mg | ORAL_TABLET | Freq: Three times a day (TID) | ORAL | Status: DC

## 2016-03-03 MED ORDER — QUETIAPINE FUMARATE 100 MG PO TABS: 100.0000 mg | ORAL_TABLET | Freq: Every day | ORAL | Status: DC

## 2016-03-03 MED ORDER — NICOTINE 21 MG/24HR TD PT24: 21.0000 mg | MEDICATED_PATCH | Freq: Every day | TRANSDERMAL | Status: DC

## 2016-03-03 MED ORDER — SERTRALINE HCL 50 MG PO TABS: 150.0000 mg | ORAL_TABLET | Freq: Every day | ORAL | Status: DC

## 2016-03-03 NOTE — [facility] Suicide Risk Assessment (Signed)
[facility]   Principal Problem: Severe recurrent major depression without psychotic features [facility](HCC) Discharge Diagnoses:  Patient Active Problem List   Diagnosis Date Noted  . 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 Alcohol dependence with uncomplicated withdrawal (HCC) [F10.230]   . Opioid type dependence, continuous (HCC) [F11.20] 01/03/2016  . Alcohol dependence (HCC) [F10.20] 01/03/2016  . PTSD (post-traumatic stress disorder) [F43.10] 01/03/2016  . Suicidal ideation [R45.851] 01/03/2016  . Severe recurrent major depression without psychotic features (HCC) [F33.2] 01/03/2016  . BACK PAIN [M54.9] 07/15/2011    Total Time spent with patient: 20 minutes  Musculoskeletal: Strength & Muscle Tone: within normal limits Gait & Station: normal Patient leans: normal  Psychiatric Specialty Exam: Review of Systems  Constitutional: Negative.   HENT: Negative.   Eyes: Negative.   Respiratory: Negative.   Cardiovascular: Negative.   Gastrointestinal: Negative.   Genitourinary: Negative.   Musculoskeletal: Negative.   Skin: Negative.   Neurological: Negative.   Endo/Heme/Allergies: Negative.   Psychiatric/Behavioral: Positive for substance abuse. The patient is nervous/anxious.     Blood pressure 106/68, pulse 74, temperature 98 F (36.7 C), temperature source Oral, resp. rate 16, height 5\' 10"  (1.778 m), weight 87.998 kg (194 lb).Body mass index is 27.84 kg/(m^2).  General Appearance: Fairly Groomed  Patent attorneyye Contact::  Fair  Speech:  Clear and Coherent409  Volume:  Normal  Mood:  worried  Affect:  Restricted  Thought Process:  Coherent and Goal Directed  Orientation:  Full (Time, Place, and Person)  Thought Content:  plans as he moves on, relapse prevention plan  Suicidal Thoughts:  No  Homicidal Thoughts:  No  Memory:  Immediate;   Fair Recent;   Fair Remote;   Fair  Judgement:  Fair  Insight:  Present and  Shallow  Psychomotor Activity:  Normal  Concentration:  Fair  Recall:  [employer]  Fund of Knowledge:Fair  Language: Fair  Akathisia:  No  Handed:  Right  AIMS (if indicated):     Assets:  Desire for Improvement  Sleep:  Number of Hours: 6.5  Cognition: WNL  ADL's:  Intact  In full contact with reality. States that his wife had a seizure and [facility]. He still wants to pursue [facility] but states he needs to be with his wife right now. There are no active S/S of withdrawal. No active SI plan or intent Mental Status Per Nursing Assessment::   On Admission:     Demographic Factors:  Male  Loss Factors: Legal issues  Historical Factors: none identified  Risk Reduction Factors:   Sense of responsibility to family  Continued Clinical Symptoms:  Depression:   Comorbid alcohol abuse/dependence Alcohol/Substance Abuse/Dependencies  Cognitive Features That Contribute To Risk:  None    Suicide Risk:  Minimal: No identifiable suicidal ideation.  Patients presenting with no risk factors but with morbid ruminations; may be classified as minimal risk based on the severity of the depressive symptoms    Plan Of Care/Follow-up recommendations:  Activity:  as tolerated Diet:  regular  Irvin Bastin A, MD 03/03/2016, 10:28 AM

## 2016-03-03 NOTE — B[facility] Suicide Risk Assessment (Signed)
[facility] INPATIENT:  Family/Significant Other Suicide Prevention Education  Suicide Prevention Education:  Patient Refusal for Family/Significant Other Suicide Prevention Education: The patient David Doyle has refused to provide written consent for family/significant other to be provided Family/Significant Other Suicide Prevention Education during admission and/or prior to discharge.  Physician notified. SPE reviewed with patient and brochure provided. Patient encouraged to return to hospital if having suicidal thoughts, patient verbalized his/her understanding and has no further questions at this time.   David Doyle, [place] CarboKristin Doyle 03/03/2016, 11:37 AM

## 2016-03-03 NOTE — Progress Notes (Signed)
  [facility] :  Will you be returning to the same living situation after discharge:  Yes,  patient will return to shelter At discharge, do you have transportation home?: Yes,  patient provided with bus pass Do you have the ability to pay for your medications: Yes,  patient will be provided with prescriptions at discharge  Release of information consent forms completed and in the chart;  Patient's signature needed at discharge.  Patient to Follow up at: Follow-up Information    Follow up with [facility] On 03/04/2016.   Why:  Call Shayla on Tuesday March 14th regarding bed availability. Referral sent on 02/29/16.   Contact information:   [address] [place] [phone]      [facility]   Why:  Referral made on 02/29/16. Go Monday-Friday at 8am for admission assessment if interested in treatment here.   Contact information:   [address] [place], [place] [address] Phone: [phone]      [facility] with [facility].   Specialty:  Behavioral Health   Why:  Walk-in clinic open Mondays, Wednesdays, and Fridays from 9am to 11:30am for assessment for outpatient therapy and medication management services.    Contact information:   [address] [place] [address] [phone]       Next level of care provider [facility] Link:no  Safety Planning and Suicide Prevention discussed: Yes,  with patient  Have you used any form of tobacco in the last 30 days? (Cigarettes, Smokeless Tobacco, Cigars, and/or Pipes): Yes  Has patient been referred to the Quitline?: Patient refused referral  Patient has been referred for addiction treatment: Yes  Irja Wheless, [place] CarboKristin L 03/03/2016, 11:46 AM

## 2016-03-03 NOTE — [facility] Group Notes (Signed)
[facility] LCSW Aftercare Discharge Planning Group Note  03/03/2016  8:45 AM  Participation Quality: Did Not Attend. Patient invited to participate but declined.  Tareq Dwan, MSW, LCSW Clinical Social Worker [facility] [phone]   

## 2016-03-03 NOTE — Discharge Summary (Signed)
Physician Discharge Summary Note  Patient:  David Doyle is an 45 y.o., male MRN:  [id number] DOB:  1971/06/15 Patient phone:  [phone] (home)  Patient address:   [address]  [place] [id number],   Total Time spent with patient: Greater than 30 minutes  Date of Admission:  02/27/2016 Date of Discharge: 03/04/2016  Reason for Admission: Alcohol/drug detox/mood stabilization treatments.  Principal Problem: Severe recurrent major depression without psychotic features David Doyle(HCC)  Discharge Diagnoses: Patient Active Problem List   Diagnosis Date Noted  . 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 Alcohol dependence with uncomplicated withdrawal (HCC) [F10.230]   . Opioid type dependence, continuous (HCC) [F11.20] 01/03/2016  . Alcohol dependence (HCC) [F10.20] 01/03/2016  . PTSD (post-traumatic stress disorder) [F43.10] 01/03/2016  . Suicidal ideation [R45.851] 01/03/2016  . Severe recurrent major depression without psychotic features (HCC) [F33.2] 01/03/2016  . BACK PAIN [M54.9] 07/15/2011   Past Psychiatric History: Opioid dependence, Alcohol use disorder, chronic  Past Medical History:  Past Medical History  Diagnosis Date  . GERD (gastroesophageal reflux disease)   . Pancreatitis   . Diabetes mellitus without complication (HCC)   . Hypertension   . Depression   . Suicidal behavior   . Substance abuse   . PTSD (post-traumatic stress disorder)   . Bipolar 1 disorder (HCC)   . Portal vein thrombosis     Past Surgical History  Procedure Laterality Date  . Tonsillectomy    . Adenoidectomy     Family History: History reviewed. No pertinent family history.  Family Psychiatric  History: See H&P  Social History:  History  Alcohol Use  . Yes    Comment: occasionally     History  Drug Use  . Yes    Comment: crack, heroine, etc    Social History   Social History  . Marital Status: Single    Spouse Name: N/A  .  Number of Children: N/A  . Years of Education: N/A   Social History Main Topics  . Smoking status: Current Every Day Smoker -- 2.00 packs/day  . Smokeless tobacco: None  . Alcohol Use: Yes     Comment: occasionally  . Drug Use: Yes     Comment: crack, heroine, etc  . Sexual Activity: Yes   Other Topics Concern  . None   Social History Narrative   Hospital Course: David Doyle, 45 year old male that reports SI with a plan to overdose in his medication. Patient reports using alcohol and heroin on a daily basis. He requests detox from alcohol and heroin. He reported a suicide attempt in the past by drinking rubbing alcohol and [facility]  David [facility] with his UDS test results positive for [facility] & a BAL of 172. He did admit to having been using heroin as well as alcohol. David Doyle was also presenting with symptoms of depression, possibly substance induced. He was in need of opioid/alcohol detox as well as mood stabilization treatments. After admission assessment/ evaluation, his presenting symptoms were detected & identified. The medication regimen targeting those symptoms were discussed & initiated. He received Clonidine detoxification treatment protocols to combat the withdrawal symptoms of opioid. He was also medicated & discharged on; Seroquel 100 mg for mood control, Sertraline 50 mg for depression & Topamax 50 mg for mood stabilization. He was enrolled & participated in the group counseling sessions being offered &  held on this unit. He learned coping skills that should help him further to cope better & manage his depression/substance abuse issues after discharge. Part of his treatment & discharge plans is for a referral to a long term substance abuse treatment [facility] for further substance abuse treatment.  Besides the detoxification treatments, David Doyle also presented with other chronic medical issues that required treatment &  monitoring. He was resumed on all of his pertinent home medications for those preexisting health issues. He tolerated his treatment regimen without any adverse effects or reactions reported. David Doyle has completed detox treatment & his mood is now stable. This is evidenced by his reports of improved mood, absence of suicidal ideations & substance withdrawal symptoms. He is currently being discharged to his home & will  continue further substance abuse treatment at the [facility] in [place], [place] after his admission screening as instructed below. He is to call [facility] tomorrow for a bed availability. He is also referred to the [facility]. He is provided with all the pertinent information needed to make these appointments without problems. Upon discharge, David Doyle adamantly denies any SIHI, AVH, delusional thoughts, paranoia or substance withdrawal symptoms. He left [facility] with all personal belongings in no apparent distress. Transportation per city bus. [facility] assisted with bus pass.   Physical Findings: AIMS: Facial and Oral Movements Muscles of Facial Expression: None, normal Lips and Perioral Area: None, normal Jaw: None, normal Tongue: None, normal,Extremity Movements Upper (arms, wrists, hands, fingers): None, normal Lower (legs, knees, ankles, toes): None, normal, Trunk Movements Neck, shoulders, hips: None, normal, Overall Severity Severity of abnormal movements (highest score from questions above): None, normal Incapacitation due to abnormal movements: None, normal Patient's awareness of abnormal movements (rate only patient's report): No Awareness, Dental Status Current problems with teeth and/or dentures?: Yes Does patient usually wear dentures?: No  CIWA:  CIWA-Ar Total: 0 COWS:  COWS Total Score: 1  Musculoskeletal: Strength & Muscle Tone: within normal limits Gait & Station: normal Patient leans: N/A  Psychiatric Specialty Exam: Review of Systems  Constitutional: Negative.    HENT: Negative.   Eyes: Negative.   Respiratory: Negative.   Cardiovascular: Negative.   Gastrointestinal: Negative.   Genitourinary: Negative.   Musculoskeletal: Negative.   Skin: Negative.   Neurological: Negative.   Endo/Heme/Allergies: Negative.   Psychiatric/Behavioral: Positive for depression (Stable) and substance abuse (Hx. Alcoholism, chronic). Negative for suicidal ideas, hallucinations and memory loss. The patient has insomnia (Stable). The patient is not nervous/anxious.   All other systems reviewed and are negative.   Blood pressure 106/68, pulse 74, temperature 98 F (36.7 C), temperature source Oral, resp. rate 16, height  (1.778 m), weight 87.998 kg (194 lb).Body mass index is 27.84 kg/(m^2).  See Md's SRA   Have you used any form of tobacco in the last 30 days? (Cigarettes, Smokeless Tobacco, Cigars, and/or Pipes): Yes  Has this patient used any form of tobacco in the last 30 days? (Cigarettes, Smokeless Tobacco, Cigars, and/or Pipes) Yes, Yes, A prescription for an FDA-approved tobacco cessation medication was offered at discharge and the patient refused  Metabolic Disorder Labs:  Lab Results  Component Value Date   HGBA1C 11.9* 02/27/2016   MPG 295 02/27/2016   MPG 289 01/03/2016   Lab Results  Component Value Date   PROLACTIN 11.1 02/27/2016   Lab Results  Component Value Date   CHOL 288* 02/27/2016   TRIG 287* 02/27/2016   HDL 51 02/27/2016   CHOLHDL 5.6 02/27/2016  VLDL 57* 02/27/2016   LDLCALC 180* 02/27/2016   LDLCALC 124* 01/03/2016    See Psychiatric Specialty Exam and Suicide Risk Assessment completed by Attending Physician prior to discharge.  Discharge destination:  Home  Is patient on multiple antipsychotic therapies at discharge:  No   Has Patient had three or more failed trials of antipsychotic monotherapy by history:  No  Recommended Plan for Multiple Antipsychotic Therapies: NA    Medication List    STOP taking these  medications        doxepin 25 MG capsule  Commonly known as:  SINEQUAN     Fish Oil 1000 MG Caps     hydrOXYzine 25 MG tablet  Commonly known as:  ATARAX/VISTARIL     LORazepam 1 MG tablet  Commonly known as:  ATIVAN     prazosin 2 MG capsule  Commonly known as:  MINIPRESS     rivaroxaban 20 MG Tabs tablet  Commonly known as:  XARELTO     ziprasidone 40 MG capsule  Commonly known as:  GEODON      TAKE these medications      Indication   atorvastatin 40 MG tablet  Commonly known as:  LIPITOR  Take 1 tablet (40 mg total) by mouth daily. For high cholesterol   Indication:  Inherited Heterozygous Hypercholesterolemia, Elevation of Both Cholesterol and Triglycerides in Blood     insulin detemir 100 UNIT/ML injection  Commonly known as:  LEVEMIR  Inject 0.26 mLs (26 Units total) into the skin at bedtime. And adjust per your ORIGINAL prescriber: For diabetes management   Indication:  Type 2 Diabetes     losartan 50 MG tablet  Commonly known as:  COZAAR  Take 1 tablet (50 mg total) by mouth daily. For high blood pressure   Indication:  High Blood Pressure     metFORMIN 500 MG tablet  Commonly known as:  GLUCOPHAGE  Take 1 tablet (500 mg total) by mouth 2 (two) times daily with a meal. For diabetes management   Indication:  Type 2 Diabetes     nicotine 21 mg/24hr patch  Commonly known as:  NICODERM CQ - dosed in mg/24 hours  Place 1 patch (21 mg total) onto the skin daily. For smoking cessation   Indication:  Nicotine Addiction     pantoprazole 40 MG tablet  Commonly known as:  PROTONIX  Take 1 tablet (40 mg total) by mouth 2 (two) times daily. For acid reflux   Indication:  Gastroesophageal Reflux Disease     QUEtiapine 100 MG tablet  Commonly known as:  SEROQUEL  Take 1 tablet (100 mg total) by mouth at bedtime. For mood control   Indication:  Mood stabilization     sertraline 50 MG tablet  Commonly known as:  ZOLOFT  Take 3 tablets (150 mg total) by mouth  daily. For depression   Indication:  Major Depressive Disorder     topiramate 50 MG tablet  Commonly known as:  TOPAMAX  Take 1 tablet (50 mg total) by mouth 3 (three) times daily. For mood stabilization   Indication:  Mood stabilizatiom       Follow-up Information    Follow up with [facility] On 03/04/2016.   Why:  Call Shayla on Tuesday March 14th regarding bed availability. Referral sent on 02/29/16.   Contact information:   [address] [place] [phone]      Follow up with [facility].   Why:  Referral made  on 02/29/16. Go Monday-Friday at 8am for admission assessment if interested in treatment here.   Contact information:   [address] [place], [place] [address] Phone: [phone]      Follow up with [facility].   Specialty:  Behavioral Health   Why:  Walk-in clinic open Mondays, Wednesdays, and Fridays from 9am to 11:30am for assessment for outpatient therapy and medication management services.    Contact information:   [address] [place] [address] [phone]     Follow-up recommendations: Activity:  As tolerated Diet: As recommended by your primary care doctor. Keep all scheduled follow-up appointments as recommended.  Comments: Take all your medications as prescribed by your mental healthcare provider. Report any adverse effects and or reactions from your medicines to your outpatient provider promptly. Patient is instructed and cautioned to not engage in alcohol and or illegal drug use while on prescription medicines. In the event of worsening symptoms, patient is instructed to call the crisis hotline, 911 and or go to the nearest ED for appropriate evaluation and treatment of symptoms. Follow-up with your primary care provider for your other medical issues, concerns and or health care needs.  Signed: Sanjuana Kava, PMHNP, FNP-BC 03/04/2016, 3:31 PM   I personally assessed the patient and  formulated the plan Madie Reno A. Dub Mikes, M.D.

## 2016-03-03 NOTE — Tx Team (Signed)
Interdisciplinary Treatment Plan Update (Adult) Date: 03/03/2016    Time Reviewed: 9:30 AM  Progress in Treatment: Attending groups: No Participating in groups: No Taking medication as prescribed: Yes Tolerating medication: Yes Family/Significant other contact made: No, patient has declined collateral contact Patient understands diagnosis: Yes Discussing patient identified problems/goals with staff: Yes Medical problems stabilized or resolved: Yes Denies suicidal/homicidal ideation: Yes Issues/concerns per patient self-inventory: Yes Other:  New problem(s) identified: N/A  Discharge Plan or Barriers: Patient plans to discharge to follow up with [facility] on his own at discharge  Reason for Continuation of Hospitalization:  Depression Anxiety Medication Stabilization   Comments: N/A  Estimated length of stay: Discharge anticipated for today 03/03/16    Patient is a 45 year old male with a diagnosis of Substance Induced Mood Disorder. Pt presented to the hospital with suicidal ideations and substance abuse. Pt reports primary trigger(s) for admission were financial and social stressors. Patient will benefit from crisis stabilization, medication evaluation, group therapy and psycho education in addition to case management for discharge planning. At discharge, it is recommended that Pt remain compliant with established discharge plan and continued treatment.   Review of initial/current patient goals per problem list:  1. Goal(s): Patient will participate in aftercare plan   Met: Yes   Target date: 3-5 days post admission date   As evidenced by: Patient will participate within aftercare plan AEB aftercare provider and housing plan at discharge being identified.   3/8: Goal not met: CSW assessing for appropriate referrals for pt and will have follow up secured prior to d/c.  3/13: Goal met. Patient plans to discharge to previous living situation to follow up with [facility] on his  own.   2. Goal (s): Patient will exhibit decreased depressive symptoms and suicidal ideations.   Met: Adequate for discharge per MD   Target date: 3-5 days post admission date   As evidenced by: Patient will utilize self rating of depression at 3 or below and demonstrate decreased signs of depression or be deemed stable for discharge by MD.   3/8: Patient rates depression at 10 today, denies SI.   3/13: Adequate for discharge per MD. Patient reports an improvement in his depressive symptoms, denies SI. He reports feeling safe to discharge today.   3. Goal(s): Patient will demonstrate decreased signs and symptoms of anxiety.   Met: Adequate for discharge per MD   Target date: 3-5 days post admission date   As evidenced by: Patient will utilize self rating of anxiety at 3 or below and demonstrated decreased signs of anxiety, or be deemed stable for discharge by MD  3/8: Patient rates anxiety at 10.  3/13: Adequate for discharge per MD. Patient reports improvements in his anxiety symptoms since admission.    4. Goal(s): Patient will demonstrate decreased signs of withdrawal due to substance abuse   Met: Adequate for discharge per MD.   Target date: 3-5 days post admission date   As evidenced by: Patient will produce a CIWA/COWS score of 0, have stable vitals signs, and no symptoms of withdrawal  3/8: Patient with COWS score of 5, experiencing anxiety, aches, sweating.  3/13: Adequate for discharge per MD. Patient with COWS score of 1, experiencing anxiety.    Attendees: Patient:    Family:    Physician:  Dr. Sabra Heck, Dr. Parke Poisson 03/03/2016 9:30 AM  Nursing: Franco Nones, Grayland Ormond, Rhea Pink, RN 03/03/2016 9:30 AM  Clinical Social Worker: Erasmo Downer Kassaundra Hair, LCSW 03/03/2016 9:30 AM  Other: Peri Maris, Alicia Amel Smart, LCSW  03/03/2016 9:30 AM  Other: Norberto Sorenson, P4CC 03/03/2016 9:30 AM  Other: Lars Pinks, Case Manager 03/03/2016 9:30 AM   Other: Ethelle Lyon, NP 03/03/2016 9:30 AM    Scribe for Treatment Team:  Tilden Fossa, Fisher

## 2016-03-03 NOTE — Progress Notes (Signed)
Patient ID: David CanadaJose A II Trawick, male   DOB: [birth year], 45 y.o.   MRN: [id number]  Pt. Denies SI/HI and A/V hallucinations. Belongings returned to patient at time of discharge. Patient denies any pain or discomfort. Discharge instructions and medications were reviewed with patient. Patient verbalized understanding of both medications and discharge instructions. Patient discharged to lobby with bus passes. No distress upon discharge. Q15 minute safety checks maintained until discharge.

## 2016-03-03 NOTE — Progress Notes (Signed)
Pt has been in his room in bed all evening.  He reports he is still having some withdrawal symptoms such as shakiness and feeling sweaty.  He denies SI/HI/AVH.  He states [facility] will [facility] to [facility] at discharge.  He is unsure when that will happen.  He has not been going to groups.  He does get up to go to meals.  He makes his needs known to staff.  Support and encouragement offered.  He is med compliant.  Discharge plans are in process.  Safety maintained with q15 minute checks.

## 2016-03-15 ENCOUNTER — Encounter (HOSPITAL_COMMUNITY): Payer: Self-pay | Admitting: *Deleted

## 2016-03-15 ENCOUNTER — Observation Stay (HOSPITAL_COMMUNITY)
Admission: AD | Admit: 2016-03-15 | Discharge: 2016-03-18 | Payer: Medicaid Other | Source: Intra-hospital | Attending: Psychiatry | Admitting: Psychiatry

## 2016-03-15 ENCOUNTER — Emergency Department (HOSPITAL_COMMUNITY)
Admission: EM | Admit: 2016-03-15 | Discharge: 2016-03-15 | Disposition: A | Discharge status: Other Acute Inpatient Hospital | Payer: Medicaid Other | Attending: Emergency Medicine | Admitting: Emergency Medicine

## 2016-03-15 ENCOUNTER — Encounter (HOSPITAL_COMMUNITY): Payer: Self-pay

## 2016-03-15 DIAGNOSIS — F1194 Opioid use, unspecified with opioid-induced mood disorder: Secondary | ICD-10-CM | POA: Diagnosis present

## 2016-03-15 DIAGNOSIS — F17210 Nicotine dependence, cigarettes, uncomplicated: Secondary | ICD-10-CM | POA: Insufficient documentation

## 2016-03-15 DIAGNOSIS — Z79899 Other long term (current) drug therapy: Secondary | ICD-10-CM | POA: Diagnosis not present

## 2016-03-15 DIAGNOSIS — F1123 Opioid dependence with withdrawal: Secondary | ICD-10-CM | POA: Insufficient documentation

## 2016-03-15 DIAGNOSIS — F319 Bipolar disorder, unspecified: Secondary | ICD-10-CM | POA: Diagnosis not present

## 2016-03-15 DIAGNOSIS — Z823 Family history of stroke: Secondary | ICD-10-CM | POA: Diagnosis not present

## 2016-03-15 DIAGNOSIS — F1210 Cannabis abuse, uncomplicated: Secondary | ICD-10-CM | POA: Diagnosis not present

## 2016-03-15 DIAGNOSIS — E119 Type 2 diabetes mellitus without complications: Secondary | ICD-10-CM | POA: Diagnosis not present

## 2016-03-15 DIAGNOSIS — F1120 Opioid dependence, uncomplicated: Secondary | ICD-10-CM

## 2016-03-15 DIAGNOSIS — Z7984 Long term (current) use of oral hypoglycemic drugs: Secondary | ICD-10-CM | POA: Diagnosis not present

## 2016-03-15 DIAGNOSIS — F419 Anxiety disorder, unspecified: Secondary | ICD-10-CM | POA: Insufficient documentation

## 2016-03-15 DIAGNOSIS — Z794 Long term (current) use of insulin: Secondary | ICD-10-CM | POA: Insufficient documentation

## 2016-03-15 DIAGNOSIS — F11229 Opioid dependence with intoxication, unspecified: Secondary | ICD-10-CM | POA: Diagnosis present

## 2016-03-15 DIAGNOSIS — F1124 Opioid dependence with opioid-induced mood disorder: Secondary | ICD-10-CM | POA: Diagnosis present

## 2016-03-15 DIAGNOSIS — I10 Essential (primary) hypertension: Secondary | ICD-10-CM | POA: Insufficient documentation

## 2016-03-15 DIAGNOSIS — K219 Gastro-esophageal reflux disease without esophagitis: Secondary | ICD-10-CM | POA: Insufficient documentation

## 2016-03-15 DIAGNOSIS — F4310 Post-traumatic stress disorder, unspecified: Secondary | ICD-10-CM | POA: Insufficient documentation

## 2016-03-15 DIAGNOSIS — G4700 Insomnia, unspecified: Secondary | ICD-10-CM | POA: Diagnosis not present

## 2016-03-15 DIAGNOSIS — F17200 Nicotine dependence, unspecified, uncomplicated: Secondary | ICD-10-CM | POA: Diagnosis not present

## 2016-03-15 DIAGNOSIS — R45851 Suicidal ideations: Secondary | ICD-10-CM | POA: Diagnosis not present

## 2016-03-15 DIAGNOSIS — F10239 Alcohol dependence with withdrawal, unspecified: Secondary | ICD-10-CM | POA: Diagnosis not present

## 2016-03-15 DIAGNOSIS — Z590 Homelessness: Secondary | ICD-10-CM | POA: Insufficient documentation

## 2016-03-15 DIAGNOSIS — E7800 Pure hypercholesterolemia, unspecified: Secondary | ICD-10-CM | POA: Insufficient documentation

## 2016-03-15 LAB — COMPREHENSIVE METABOLIC PANEL
ALBUMIN: 4.3 g/dL (ref 3.5–5.0)
ALT: 15 U/L — ABNORMAL LOW (ref 17–63)
ANION GAP: 11 (ref 5–15)
AST: 17 U/L (ref 15–41)
Alkaline Phosphatase: 65 U/L (ref 38–126)
BUN: 12 mg/dL (ref 6–20)
CALCIUM: 8.8 mg/dL — AB (ref 8.9–10.3)
CO2: 24 mmol/L (ref 22–32)
Chloride: 104 mmol/L (ref 101–111)
Creatinine, Ser: 0.66 mg/dL (ref 0.61–1.24)
GFR calc non Af Amer: >60 mL/min (ref >60)
GLUCOSE: 191 mg/dL — AB (ref 65–99)
POTASSIUM: 3.6 mmol/L (ref 3.5–5.1)
SODIUM: 139 mmol/L (ref 135–145)
TOTAL PROTEIN: 7 g/dL (ref 6.5–8.1)
Total Bilirubin: 0.7 mg/dL (ref 0.3–1.2)

## 2016-03-15 LAB — ACETAMINOPHEN LEVEL

## 2016-03-15 LAB — CBC
HCT: 35.7 % — ABNORMAL LOW (ref 39.0–52.0)
Hemoglobin: 12.7 g/dL — ABNORMAL LOW (ref 13.0–17.0)
MCH: 29.9 pg (ref 26.0–34.0)
MCHC: 35.6 g/dL (ref 30.0–36.0)
MCV: 84 fL (ref 78.0–100.0)
Platelets: 204 10*3/uL (ref 150–400)
RBC: 4.25 MIL/uL (ref 4.22–5.81)
RDW: 13 % (ref 11.5–15.5)
WBC: 14.6 10*3/uL — ABNORMAL HIGH (ref 4.0–10.5)

## 2016-03-15 LAB — SALICYLATE LEVEL

## 2016-03-15 LAB — RAPID URINE DRUG SCREEN, HOSP PERFORMED
AMPHETAMINES: NOT DETECTED
BARBITURATES: NOT DETECTED
BENZODIAZEPINES: NOT DETECTED
Cocaine: NOT DETECTED
Opiates: POSITIVE — AB
TETRAHYDROCANNABINOL: POSITIVE — AB

## 2016-03-15 LAB — ETHANOL: Alcohol, Ethyl (B): <5 mg/dL (ref <5)

## 2016-03-15 MED ORDER — QUETIAPINE FUMARATE 100 MG PO TABS
100.0000 mg | ORAL_TABLET | Freq: Every day | ORAL | Status: DC
  Administered 2016-03-15 – 2016-03-17 (×3): 100 mg via ORAL
  Filled 2016-03-15: qty 14
  Filled 2016-03-15 (×3): qty 1

## 2016-03-15 MED ORDER — PANTOPRAZOLE SODIUM 40 MG PO TBEC
40.0000 mg | DELAYED_RELEASE_TABLET | Freq: Two times a day (BID) | ORAL | Status: DC
  Administered 2016-03-15 – 2016-03-18 (×6): 40 mg via ORAL
  Filled 2016-03-15 (×6): qty 1
  Filled 2016-03-15: qty 28

## 2016-03-15 MED ORDER — MAGNESIUM HYDROXIDE 400 MG/5ML PO SUSP: 30.0000 mL | Freq: Every day | ORAL | Status: DC | PRN

## 2016-03-15 MED ORDER — SERTRALINE HCL 50 MG PO TABS
150.0000 mg | ORAL_TABLET | Freq: Every day | ORAL | Status: DC
  Administered 2016-03-16 – 2016-03-18 (×3): 150 mg via ORAL
  Filled 2016-03-15 (×4): qty 1

## 2016-03-15 MED ORDER — NICOTINE 21 MG/24HR TD PT24
21.0000 mg | MEDICATED_PATCH | Freq: Every day | TRANSDERMAL | Status: DC
  Administered 2016-03-16 – 2016-03-17 (×2): 21 mg via TRANSDERMAL
  Filled 2016-03-15: qty 14
  Filled 2016-03-15 (×3): qty 1

## 2016-03-15 MED ORDER — TOPIRAMATE 25 MG PO TABS
50.0000 mg | ORAL_TABLET | Freq: Three times a day (TID) | ORAL | Status: DC
  Administered 2016-03-15: 50 mg via ORAL
  Filled 2016-03-15: qty 2

## 2016-03-15 MED ORDER — LOSARTAN POTASSIUM 50 MG PO TABS
50.0000 mg | ORAL_TABLET | Freq: Every day | ORAL | Status: DC
  Administered 2016-03-16 – 2016-03-18 (×3): 50 mg via ORAL
  Filled 2016-03-15 (×2): qty 7
  Filled 2016-03-15 (×3): qty 2

## 2016-03-15 MED ORDER — METFORMIN HCL 500 MG PO TABS
500.0000 mg | ORAL_TABLET | Freq: Two times a day (BID) | ORAL | Status: DC
  Filled 2016-03-15 (×2): qty 1

## 2016-03-15 MED ORDER — PANTOPRAZOLE SODIUM 40 MG PO TBEC: 40.0000 mg | DELAYED_RELEASE_TABLET | Freq: Two times a day (BID) | ORAL | Status: DC

## 2016-03-15 MED ORDER — QUETIAPINE FUMARATE 100 MG PO TABS: 100.0000 mg | ORAL_TABLET | Freq: Every day | ORAL | Status: DC

## 2016-03-15 MED ORDER — ATORVASTATIN CALCIUM 40 MG PO TABS
40.0000 mg | ORAL_TABLET | Freq: Every day | ORAL | Status: DC
  Administered 2016-03-16 – 2016-03-18 (×3): 40 mg via ORAL
  Filled 2016-03-15: qty 7
  Filled 2016-03-15 (×3): qty 1

## 2016-03-15 MED ORDER — ATORVASTATIN CALCIUM 40 MG PO TABS
40.0000 mg | ORAL_TABLET | Freq: Every day | ORAL | Status: DC
  Administered 2016-03-15: 40 mg via ORAL
  Filled 2016-03-15: qty 1

## 2016-03-15 MED ORDER — SERTRALINE HCL 50 MG PO TABS
150.0000 mg | ORAL_TABLET | Freq: Every day | ORAL | Status: DC
  Administered 2016-03-15: 150 mg via ORAL
  Filled 2016-03-15 (×2): qty 3

## 2016-03-15 MED ORDER — ALUM & MAG HYDROXIDE-SIMETH 200-200-20 MG/5ML PO SUSP: 30.0000 mL | ORAL | Status: DC | PRN

## 2016-03-15 MED ORDER — LOSARTAN POTASSIUM 50 MG PO TABS
50.0000 mg | ORAL_TABLET | Freq: Every day | ORAL | Status: DC
  Administered 2016-03-15: 50 mg via ORAL
  Filled 2016-03-15: qty 1

## 2016-03-15 MED ORDER — ACETAMINOPHEN 325 MG PO TABS: 650.0000 mg | ORAL_TABLET | Freq: Four times a day (QID) | ORAL | Status: DC | PRN

## 2016-03-15 MED ORDER — NICOTINE 21 MG/24HR TD PT24
21.0000 mg | MEDICATED_PATCH | Freq: Every day | TRANSDERMAL | Status: DC
  Administered 2016-03-15: 21 mg via TRANSDERMAL
  Filled 2016-03-15: qty 1

## 2016-03-15 MED ORDER — TOPIRAMATE 100 MG PO TABS
50.0000 mg | ORAL_TABLET | Freq: Three times a day (TID) | ORAL | Status: DC
  Administered 2016-03-16 – 2016-03-18 (×8): 50 mg via ORAL
  Filled 2016-03-15: qty 2
  Filled 2016-03-15: qty 21
  Filled 2016-03-15 (×2): qty 2
  Filled 2016-03-15: qty 1
  Filled 2016-03-15 (×4): qty 2

## 2016-03-15 MED ORDER — METFORMIN HCL 500 MG PO TABS
500.0000 mg | ORAL_TABLET | Freq: Two times a day (BID) | ORAL | Status: DC
  Administered 2016-03-16 – 2016-03-18 (×5): 500 mg via ORAL
  Filled 2016-03-15: qty 28
  Filled 2016-03-15 (×5): qty 1

## 2016-03-15 NOTE — ED Notes (Signed)
Per EMS pt coming from homeless shelter requesting heroin detox. EMS reports was treated at [facility] last night for heroin overdose after found unresponsive, was d/c'd earlier this morning and upon discharge he used leftover heroin he had and then called EMS. He reports SI and is asking help with heroin detox.

## 2016-03-15 NOTE — Progress Notes (Signed)
Disposition CSW completed patient referrals to the following inpatient psych facilities:  [facility]  CSW will continue to follow patient for placement needs.  Seward SpeckLeo Anderson [facility] Behavioral Health Disposition CSW [phone]

## 2016-03-15 NOTE — ED Notes (Addendum)
[facility] [place], belongings given to driver.

## 2016-03-15 NOTE — ED Notes (Signed)
tts into see 

## 2016-03-15 NOTE — BHH Counselor (Signed)
[facility] per Nanine MeansJamison Lord, DNP. Will speak with patient regarding acceptance and signing voluntary consent to treat and ROI.   Davina PokeJoVea Cristabel Bicknell, LCSW Therapeutic Triage Specialist [facility] 03/15/2016 4:10 PM

## 2016-03-15 NOTE — ED Notes (Signed)
Bed: [facility]34 Expected date:  Expected time:  Means of arrival:  Comments: Triage 4 

## 2016-03-15 NOTE — BHH Counselor (Signed)
Patient informed that he was accepted to the observation unit. Patient reviewed and signed voluntary consent to treat and ROI to his wife.  Patient denies having questions or concerns at this time. [facility] and provided to patients nurse to transport with patient via [facility]. Call [phone] or [phone] for report.   Davina PokeJoVea Hernandez [place], LCSW Therapeutic Triage Specialist [facility] 03/15/2016 4:21 PM
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
## 2016-03-15 NOTE — ED Notes (Signed)
Bed: [facility] Expected date:  Expected time:  Means of arrival:  Comments: EMS- SI, Heroin detox

## 2016-03-15 NOTE — Progress Notes (Signed)
Nursing Admission Note  [facility] with diagnosis of Opiod Use D/O, Severe. Patient states he uses heroin 1 gram daily either snorted or freebased and also drinks equivalent of one fifth bottle of alcohol daily and has done this since shortly after first use of heroin at age 45, and had already been heavily drinking on a daily basis for "20 or 30" years. Patient also reports approximate twice weekly use of crack cocaine. Patient is cooperative and eating supper provided for him voraciously during assessment. Skin search performed and belongings checked into locker 52. Patient is disheveled, malodorous, dirty fingernails and toenails, states "I run the streets most of the time", and also states he still lives with his wife of 2 years but he is mostly out drinking and using heroin on the streets. Patient admits to having attempted to overdose late last night/early this morning, was found unconscious in front of a store by EMS and brought to the ED and treated. Patient admits to attempting to overdose "many times", even admitting to every day not caring if he were to overdose.  Patient is disabled, [facility], was discharged, maintained sobriety for two weeks, did not follow up as instructed, relapsed and is once again daily using alcohol and heroin just as before.  Patient states his goals are "to feel better physically and mentally", and "to stop drinking and using heroin and any drugs".

## 2016-03-15 NOTE — Progress Notes (Signed)
Patient sleeping most of the shift. gets up for medications, snacks and bathroom use.  Patient was disheveled and have sever body odor. Was encouraged to shower - complied. Denies pain, SI, AH/VH at this time. Endorses depression and mild anxiety. Patient accepted his bedtime medicine and went to bed. No further complaint. Safety maintained by constant monitoring/observation. Patient remained safe.

## 2016-03-15 NOTE — ED Provider Notes (Signed)
CSN: [id number]     Arrival date & time 03/15/16  1000 History   First MD Initiated Contact with Patient 03/15/16 1011     Chief Complaint  Patient presents with  . heroin detox   . Suicidal     (Consider location/radiation/quality/duration/timing/severity/associated sxs/prior Treatment) The history is provided by the patient.  Patient w hx etoh, substance abuse, depression, c/o ongoing problems w substance abuse. Indicates after recent inpatient tx did not follow up with [facility], [facility], or [facility].  Pt indicates feels depressed about ongoing substance abuse, and has reported thoughts of suicide.  Pt denies specific plan.  Indicates he doesn't feel safe in the world at times, is current homeless, [place].  Pt denies any current/recent attempt to hurt or kill self. Pt denies recent physical health problems. Normal appetite. Sleeps ok. No wt loss.       Past Medical History  Diagnosis Date  . GERD (gastroesophageal reflux disease)   . Pancreatitis   . Diabetes mellitus without complication (HCC)   . Hypertension   . Depression   . Suicidal behavior   . Substance abuse   . PTSD (post-traumatic stress disorder)   . Bipolar 1 disorder (HCC)   . Portal vein thrombosis    Past Surgical History  Procedure Laterality Date  . Tonsillectomy    . Adenoidectomy     No family history on file. Social History  Substance Use Topics  . Smoking status: Current Every Day Smoker -- 2.00 packs/day  . Smokeless tobacco: None  . Alcohol Use: Yes     Comment: occasionally    Review of Systems  Constitutional: Negative for fever.  HENT: Negative for sore throat.   Eyes: Negative for redness.  Respiratory: Negative for shortness of breath.   Cardiovascular: Negative for chest pain.  Gastrointestinal: Negative for abdominal pain.  Genitourinary: Negative for flank pain.  Musculoskeletal: Negative for back pain and neck pain.  Skin: Negative for rash.  Neurological: Negative for  headaches.  Hematological: Does not bruise/bleed easily.  Psychiatric/Behavioral: Positive for dysphoric mood.      Allergies  Review of patient's allergies indicates no known allergies.  Home Medications   Prior to Admission medications   Medication Sig Start Date End Date Taking? Authorizing Provider  atorvastatin (LIPITOR) 40 MG tablet Take 1 tablet (40 mg total) by mouth daily. For high cholesterol 03/03/16   Sanjuana KavaAgnes I Nwoko, NP  insulin detemir (LEVEMIR) 100 UNIT/ML injection Inject 0.26 mLs (26 Units total) into the skin at bedtime. And adjust per your ORIGINAL prescriber: For diabetes management 03/03/16   Sanjuana KavaAgnes I Nwoko, NP  losartan (COZAAR) 50 MG tablet Take 1 tablet (50 mg total) by mouth daily. For high blood pressure 03/03/16   Sanjuana KavaAgnes I Nwoko, NP  metFORMIN (GLUCOPHAGE) 500 MG tablet Take 1 tablet (500 mg total) by mouth 2 (two) times daily with a meal. For diabetes management 03/03/16   Sanjuana KavaAgnes I Nwoko, NP  nicotine (NICODERM CQ - DOSED IN MG/24 HOURS) 21 mg/24hr patch Place 1 patch (21 mg total) onto the skin daily. For smoking cessation 03/03/16   Sanjuana KavaAgnes I Nwoko, NP  pantoprazole (PROTONIX) 40 MG tablet Take 1 tablet (40 mg total) by mouth 2 (two) times daily. For acid reflux 03/03/16   Sanjuana KavaAgnes I Nwoko, NP  QUEtiapine (SEROQUEL) 100 MG tablet Take 1 tablet (100 mg total) by mouth at bedtime. For mood control 03/03/16   Sanjuana KavaAgnes I Nwoko, NP  sertraline (ZOLOFT) 50 MG tablet Take 3  tablets (150 mg total) by mouth daily. For depression 03/03/16   Sanjuana Kava, NP  topiramate (TOPAMAX) 50 MG tablet Take 1 tablet (50 mg total) by mouth 3 (three) times daily. For mood stabilization 03/03/16   Sanjuana Kava, NP   BP 126/75 mmHg  Pulse 75  Temp(Src) 98.2 F (36.8 C) (Oral)  Resp 17  SpO2 98% Physical Exam  Constitutional: He is oriented to person, place, and time. He appears well-developed and well-nourished. No distress.  HENT:  Head: Atraumatic.  Mouth/Throat: Oropharynx is clear and moist.   Eyes: Conjunctivae are normal. Pupils are equal, round, and reactive to light. No scleral icterus.  Neck: Neck supple. No tracheal deviation present.  Cardiovascular: Normal rate, regular rhythm, normal heart sounds and intact distal pulses.   Pulmonary/Chest: Effort normal and breath sounds normal. No accessory muscle usage. No respiratory distress.  Abdominal: Soft. He exhibits no distension. There is no tenderness.  Musculoskeletal: Normal range of motion. He exhibits no edema.  Neurological: He is alert and oriented to person, place, and time.  Steady gait. No tremor or shakes.   Skin: Skin is warm and dry. No rash noted. He is not diaphoretic.  Psychiatric:  Mildly anxious appearing.   Nursing note and vitals reviewed.   ED Course  Procedures (including critical care time) Labs Review  Results for orders placed or performed during the hospital encounter of 03/15/16  Ethanol  Result Value Ref Range   Alcohol, Ethyl (B) <5 <5 mg/dL  Acetaminophen level  Result Value Ref Range   Acetaminophen (Tylenol), Serum <10 (L) 10 - 30 ug/mL  Salicylate level  Result Value Ref Range   Salicylate Lvl <4.0 2.8 - 30.0 mg/dL  CBC  Result Value Ref Range   WBC 14.6 (H) 4.0 - 10.5 K/uL   RBC 4.25 4.22 - 5.81 MIL/uL   Hemoglobin 12.7 (L) 13.0 - 17.0 g/dL   HCT 16.1 (L) 09.6 - 04.5 %   MCV 84.0 78.0 - 100.0 fL   MCH 29.9 26.0 - 34.0 pg   MCHC 35.6 30.0 - 36.0 g/dL   RDW 40.9 81.1 - 91.4 %   Platelets 204 150 - 400 K/uL  Comprehensive metabolic panel  Result Value Ref Range   Sodium 139 135 - 145 mmol/L   Potassium 3.6 3.5 - 5.1 mmol/L   Chloride 104 101 - 111 mmol/L   CO2 24 22 - 32 mmol/L   Glucose, Bld 191 (H) 65 - 99 mg/dL   BUN 12 6 - 20 mg/dL   Creatinine, Ser 7.82 0.61 - 1.24 mg/dL   Calcium 8.8 (L) 8.9 - 10.3 mg/dL   Total Protein 7.0 6.5 - 8.1 g/dL   Albumin 4.3 3.5 - 5.0 g/dL   AST 17 15 - 41 U/L   ALT 15 (L) 17 - 63 U/L   Alkaline Phosphatase 65 38 - 126 U/L   Total  Bilirubin 0.7 0.3 - 1.2 mg/dL   GFR calc non Af Amer >60 >60 mL/min   GFR calc Af Amer >60 >60 mL/min   Anion gap 11 5 - 15       I have personally reviewed and evaluated these lab results as part of my medical decision-making.    MDM   Labs.  Reviewed nursing notes and prior charts for additional history. Pt w multiple recent ED evals for similar symptoms both here and in [place] system.   Behavioral health team consulted.  Recheck, pt calm and alert, no tremor  or shakes.  [facility] eval pending, dispo per [facility] team.     Cathren Laine, MD 03/15/16 [phone]

## 2016-03-15 NOTE — ED Notes (Signed)
Up to the bathroom 

## 2016-03-16 DIAGNOSIS — F1124 Opioid dependence with opioid-induced mood disorder: Secondary | ICD-10-CM | POA: Diagnosis not present

## 2016-03-16 LAB — GLUCOSE, CAPILLARY
GLUCOSE-CAPILLARY: 254 mg/dL — AB (ref 65–99)
GLUCOSE-CAPILLARY: 272 mg/dL — AB (ref 65–99)

## 2016-03-16 MED ORDER — CLONIDINE HCL 0.1 MG PO TABS
0.1000 mg | ORAL_TABLET | ORAL | Status: DC
  Administered 2016-03-18: 0.1 mg via ORAL
  Filled 2016-03-16: qty 1

## 2016-03-16 MED ORDER — LORAZEPAM 1 MG PO TABS
1.0000 mg | ORAL_TABLET | Freq: Four times a day (QID) | ORAL | Status: DC | PRN
  Administered 2016-03-16 – 2016-03-17 (×2): 1 mg via ORAL
  Filled 2016-03-16 (×2): qty 1

## 2016-03-16 MED ORDER — CLONIDINE HCL 0.1 MG PO TABS: 0.1000 mg | ORAL_TABLET | Freq: Every day | ORAL | Status: DC

## 2016-03-16 MED ORDER — DICYCLOMINE HCL 20 MG PO TABS
20.0000 mg | ORAL_TABLET | Freq: Four times a day (QID) | ORAL | Status: DC | PRN
  Administered 2016-03-17: 20 mg via ORAL
  Filled 2016-03-16: qty 1

## 2016-03-16 MED ORDER — NAPROXEN 500 MG PO TABS: 500.0000 mg | ORAL_TABLET | Freq: Two times a day (BID) | ORAL | Status: DC | PRN

## 2016-03-16 MED ORDER — ONDANSETRON 4 MG PO TBDP
4.0000 mg | ORAL_TABLET | Freq: Four times a day (QID) | ORAL | Status: DC | PRN
  Administered 2016-03-16: 4 mg via ORAL
  Filled 2016-03-16: qty 1

## 2016-03-16 MED ORDER — CLONIDINE HCL 0.1 MG PO TABS
0.1000 mg | ORAL_TABLET | Freq: Four times a day (QID) | ORAL | Status: AC
  Administered 2016-03-16 – 2016-03-17 (×7): 0.1 mg via ORAL
  Filled 2016-03-16 (×7): qty 1

## 2016-03-16 MED ORDER — LOPERAMIDE HCL 2 MG PO CAPS: 2.0000 mg | ORAL_CAPSULE | ORAL | Status: DC | PRN

## 2016-03-16 MED ORDER — METHOCARBAMOL 500 MG PO TABS
500.0000 mg | ORAL_TABLET | Freq: Three times a day (TID) | ORAL | Status: DC | PRN
  Administered 2016-03-17: 500 mg via ORAL
  Filled 2016-03-16: qty 1

## 2016-03-16 MED ORDER — HYDROXYZINE HCL 25 MG PO TABS
25.0000 mg | ORAL_TABLET | Freq: Four times a day (QID) | ORAL | Status: DC | PRN
  Filled 2016-03-16: qty 20

## 2016-03-16 NOTE — [facility] Counselor (Signed)
Spoke with pt. Brief counseling. Pt states has appt. With [facility] on 3/29 for tx.. Pt was unsure of next steps in tx. Pt per Vernona RiegerLaura, NP to stay overnight in [facility], detox protocol with Ativan.   Pt was indecisive  Earlier about inpt./detox treatment. This Clinical research associatewriter to speak with pt. again after dinner when is more alert and oriented. ROI is ready with [facility] and [facility] for tx., upon pt. Confirmation and signature. As per pt. Requested earlier, but was not fully alert and oriented for decisions. Bird Swetz K. Sherlon HandingHarris, LCAS-A, LPC-A, [facility]  Counselor 03/16/2016 3:51 PM

## 2016-03-16 NOTE — [facility] Counselor (Signed)
Pt. Signed ROI. [facility].  [facility] referral need to be sent, fax currently busy. Nehemiah Montee K. Sherlon HandingHarris, LCAS-A, LPC-A, [facility]  Counselor 03/16/2016 7:01 PM

## 2016-03-16 NOTE — Progress Notes (Signed)
[facility] 7a-7p shift:   D:  Pt. States that he is withdrawing from heroin and alcohol and appears agitated and anxious but fell back asleep easily prior to receiving any prn medications.  He reported feeling better by dinnertime.  He continues to endorse passive SI but is contracting for safety.  A: Support, education, and encouragement provided as needed.   R: Pt.  receptive to intervention/s.  Safety maintained.  Joaquin MusicMary Hosea Hanawalt, RN

## 2016-03-16 NOTE — H&P (Signed)
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 Heroin dependence ([place]) [F11.20] 03/15/2016  . 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 Alcohol abuse with alcohol-induced mental disorder ([place]) [F10.988] 02/27/2016  . Alcohol dependence with uncomplicated withdrawal ([place]) [F10.230]   . Opioid type dependence, continuous ([place]) [F11.20] 01/03/2016  . Alcohol dependence ([place]) [F10.20] 01/03/2016  . PTSD (post-traumatic stress disorder) [F43.10] 01/03/2016  . Suicidal ideation [R45.851] 01/03/2016  . Severe recurrent major depression without psychotic features ([place]) [F33.2] 01/03/2016  . BACK PAIN [M54.9] 07/15/2011   History of Present Illness:  David Doyle, 45 year old male that reports SI with a plan to overdose on illicit substances. Patient reports using alcohol and heroin on a daily basis.  He requests detox from alcohol and heroin.  He reported a suicide attempt in the past by drinking rubbing alcohol and was treated as an inpatient at [facility] and [facility].Patient appears very depressed during the assessment today stating "I am withdrawing from heroine. I use a gram per day. I just tried to kill myself. I am still living at the shelter but I use when I'm away. I am having sweats, chills, and nausea. I'm supposed to get into rehab Wednesday at [facility] but not sure I will make it right now. I would overdose again if I could." David Doyle is noted to be very disheveled and malodorous during assessment. Notes in epic indicate that after patient attempted to overdose he was found unconscious in front of a store by EMS who brought patient to the ED for treatment. The patient was  last at [facility] in early March 2017 but did not follow up as instructed with eventual relapse on alcohol and heroin. His goal is to stop using heroin and other drugs. His current urine drug screen is positive for opiates and marijuana.   Associated Signs/Symptoms: Depression Symptoms:  depressed mood, anhedonia, fatigue, feelings of worthlessness/guilt, difficulty concentrating, hopelessness, recurrent thoughts of death, suicidal thoughts with specific plan, anxiety, loss of energy/fatigue, disturbed sleep, (Hypo) Manic Symptoms:  Labiality of Mood, Anxiety Symptoms:  NA Psychotic Symptoms:  NA PTSD Symptoms: NA Total Time spent with patient: 45 minutes  Past Psychiatric History: see above noted  Is the patient at risk to self? Yes.    Has the patient been a risk to self in the past 6 months? Yes.    Has the patient been a risk to self within the distant past? Yes.    Is the patient a risk to others? No.  Has the patient been a risk to others in the past 6 months? No.  Has the patient been a risk to others within the distant past? No.   Prior Inpatient Therapy:   Prior Outpatient Therapy:    Alcohol Screening: 1. How often do you have a drink containing alcohol?: 4 or more times a week 2. How many drinks containing alcohol do you have on a typical day when you are drinking?: 10 or more 3. How often do you have six or more drinks on one occasion?: Daily or almost daily Preliminary Score: 8 4. How often during the last year have you found that you were not able to stop drinking once  you had started?: Daily or almost daily 5. How often during the last year have you failed to do what was normally expected from you becasue of drinking?: Daily or almost daily 6. How often during the last year have you needed a first drink in the morning to get yourself going after a heavy drinking session?: Daily or almost daily 7. How often during the last year have you had a feeling of guilt of  remorse after drinking?: Daily or almost daily 8. How often during the last year have you been unable to remember what happened the night before because you had been drinking?: Daily or almost daily 9. Have you or someone else been injured as a result of your drinking?: No 10. Has a relative or friend or a doctor or another health worker been concerned about your drinking or suggested you cut down?: Yes, during the last year Alcohol Use Disorder Identification Test Final Score (AUDIT): 36 Brief Intervention: Yes Substance Abuse History in the last 12 months:  Yes.   Consequences of Substance Abuse: Withdrawal Symptoms:   Tremors crisis management Previous Psychotropic Medications: Yes  Psychological Evaluations: Yes  Past Medical History:  Past Medical History  Diagnosis Date  . GERD (gastroesophageal reflux disease)   . Pancreatitis   . Diabetes mellitus without complication ([place])   . Hypertension   . Depression   . Suicidal behavior   . Substance abuse   . PTSD (post-traumatic stress disorder)   . Bipolar 1 disorder ([place])   . Portal vein thrombosis     Past Surgical History  Procedure Laterality Date  . Tonsillectomy    . Adenoidectomy     Family History: History reviewed. No pertinent family history. Family Psychiatric  History: denied Tobacco Screening: 2 packs a day Social History:  History  Alcohol Use  . Yes    Comment: occasionally     History  Drug Use  . Yes  . Special: Cocaine, IV    Comment: crack, heroin, etc    Additional Social History:    Allergies:  No Known Allergies Lab Results:  Results for orders placed or performed during the hospital encounter of 03/15/16 (from the past 48 hour(s))  Ethanol     Status: None   Collection Time: 03/15/16 11:09 AM  Result Value Ref Range   Alcohol, Ethyl (B) <5 <5 mg/dL    Comment:        LOWEST DETECTABLE LIMIT FOR SERUM ALCOHOL IS 5 mg/dL FOR MEDICAL PURPOSES ONLY   Acetaminophen level     Status:  Abnormal   Collection Time: 03/15/16 11:09 AM  Result Value Ref Range   Acetaminophen (Tylenol), Serum <10 (L) 10 - 30 ug/mL    Comment:        THERAPEUTIC CONCENTRATIONS VARY SIGNIFICANTLY. A RANGE OF 10-30 ug/mL MAY BE AN EFFECTIVE CONCENTRATION FOR MANY PATIENTS. HOWEVER, SOME ARE BEST TREATED AT CONCENTRATIONS OUTSIDE THIS RANGE. ACETAMINOPHEN CONCENTRATIONS >150 ug/mL AT 4 HOURS AFTER INGESTION AND >50 ug/mL AT 12 HOURS AFTER INGESTION ARE OFTEN ASSOCIATED WITH TOXIC REACTIONS.   Salicylate level     Status: None   Collection Time: 03/15/16 11:09 AM  Result Value Ref Range   Salicylate Lvl <1.6 2.8 - 30.0 mg/dL  CBC     Status: Abnormal   Collection Time: 03/15/16 11:09 AM  Result Value Ref Range   WBC 14.6 (H) 4.0 - 10.5 K/uL   RBC 4.25 4.22 - 5.81 MIL/uL   Hemoglobin 12.7 (L) 13.0 -  17.0 g/dL   HCT 35.7 (L) 39.0 - 52.0 %   MCV 84.0 78.0 - 100.0 fL   MCH 29.9 26.0 - 34.0 pg   MCHC 35.6 30.0 - 36.0 g/dL   RDW 13.0 11.5 - 15.5 %   Platelets 204 150 - 400 K/uL  Comprehensive metabolic panel     Status: Abnormal   Collection Time: 03/15/16 11:09 AM  Result Value Ref Range   Sodium 139 135 - 145 mmol/L   Potassium 3.6 3.5 - 5.1 mmol/L   Chloride 104 101 - 111 mmol/L   CO2 24 22 - 32 mmol/L   Glucose, Bld 191 (H) 65 - 99 mg/dL   BUN 12 6 - 20 mg/dL   Creatinine, Ser 0.66 0.61 - 1.24 mg/dL   Calcium 8.8 (L) 8.9 - 10.3 mg/dL   Total Protein 7.0 6.5 - 8.1 g/dL   Albumin 4.3 3.5 - 5.0 g/dL   AST 17 15 - 41 U/L   ALT 15 (L) 17 - 63 U/L   Alkaline Phosphatase 65 38 - 126 U/L   Total Bilirubin 0.7 0.3 - 1.2 mg/dL   GFR calc non Af Amer >60 >60 mL/min   GFR calc Af Amer >60 >60 mL/min    Comment: (NOTE) The eGFR has been calculated using the CKD EPI equation. This calculation has not been validated in all clinical situations. eGFR's persistently <60 mL/min signify possible Chronic Kidney Disease.    Anion gap 11 5 - 15  Urine rapid drug screen (hosp performed)      Status: Abnormal   Collection Time: 03/15/16  1:06 PM  Result Value Ref Range   Opiates POSITIVE (A) NONE DETECTED   Cocaine NONE DETECTED NONE DETECTED   Benzodiazepines NONE DETECTED NONE DETECTED   Amphetamines NONE DETECTED NONE DETECTED   Tetrahydrocannabinol POSITIVE (A) NONE DETECTED   Barbiturates NONE DETECTED NONE DETECTED    Comment:        DRUG SCREEN FOR MEDICAL PURPOSES ONLY.  IF CONFIRMATION IS NEEDED FOR ANY PURPOSE, NOTIFY LAB WITHIN 5 DAYS.        LOWEST DETECTABLE LIMITS FOR URINE DRUG SCREEN Drug Class       Cutoff (ng/mL) Amphetamine      1000 Barbiturate      200 Benzodiazepine   510 Tricyclics       258 Opiates          300 Cocaine          300 THC              50     Blood Alcohol level:  Lab Results  Component Value Date   ETH <5 03/15/2016   ETH 172* 52/77/8242    Metabolic Disorder Labs:  Lab Results  Component Value Date   HGBA1C 11.9* 02/27/2016   MPG 295 02/27/2016   MPG 289 01/03/2016   Lab Results  Component Value Date   PROLACTIN 11.1 02/27/2016   Lab Results  Component Value Date   CHOL 288* 02/27/2016   TRIG 287* 02/27/2016   HDL 51 02/27/2016   CHOLHDL 5.6 02/27/2016   VLDL 57* 02/27/2016   LDLCALC 180* 02/27/2016   LDLCALC 124* 01/03/2016    Current Medications: Current Facility-Administered Medications  Medication Dose Route Frequency Provider Last Rate Last Dose  . acetaminophen (TYLENOL) tablet 650 mg  650 mg Oral Q6H PRN Patrecia Pour, NP      . alum & mag hydroxide-simeth (MAALOX/MYLANTA) 200-200-20 MG/5ML suspension 30 mL  30 mL Oral Q4H PRN Patrecia Pour, NP      . atorvastatin (LIPITOR) tablet 40 mg  40 mg Oral Daily Patrecia Pour, NP   40 mg at 03/16/16 0843  . cloNIDine (CATAPRES) tablet 0.1 mg  0.1 mg Oral QID Niel Hummer, NP   0.1 mg at 03/16/16 5885   Followed by  . [START ON 03/18/2016] cloNIDine (CATAPRES) tablet 0.1 mg  0.1 mg Oral BH-qamhs Niel Hummer, NP       Followed by  . [START ON  03/20/2016] cloNIDine (CATAPRES) tablet 0.1 mg  0.1 mg Oral QAC breakfast Niel Hummer, NP      . dicyclomine (BENTYL) tablet 20 mg  20 mg Oral Q6H PRN Niel Hummer, NP      . hydrOXYzine (ATARAX/VISTARIL) tablet 25 mg  25 mg Oral Q6H PRN Niel Hummer, NP      . loperamide (IMODIUM) capsule 2-4 mg  2-4 mg Oral PRN Niel Hummer, NP      . LORazepam (ATIVAN) tablet 1 mg  1 mg Oral Q6H PRN Niel Hummer, NP   1 mg at 03/16/16 0953  . losartan (COZAAR) tablet 50 mg  50 mg Oral Daily Patrecia Pour, NP   50 mg at 03/16/16 0277  . magnesium hydroxide (MILK OF MAGNESIA) suspension 30 mL  30 mL Oral Daily PRN Patrecia Pour, NP      . metFORMIN (GLUCOPHAGE) tablet 500 mg  500 mg Oral BID WC Patrecia Pour, NP   500 mg at 03/16/16 0844  . methocarbamol (ROBAXIN) tablet 500 mg  500 mg Oral Q8H PRN Niel Hummer, NP      . naproxen (NAPROSYN) tablet 500 mg  500 mg Oral BID PRN Niel Hummer, NP      . nicotine (NICODERM CQ - dosed in mg/24 hours) patch 21 mg  21 mg Transdermal Daily Patrecia Pour, NP   21 mg at 03/16/16 0843  . ondansetron (ZOFRAN-ODT) disintegrating tablet 4 mg  4 mg Oral Q6H PRN Niel Hummer, NP   4 mg at 03/16/16 0953  . pantoprazole (PROTONIX) EC tablet 40 mg  40 mg Oral BID Patrecia Pour, NP   40 mg at 03/16/16 0844  . QUEtiapine (SEROQUEL) tablet 100 mg  100 mg Oral QHS Patrecia Pour, NP   100 mg at 03/15/16 2202  . sertraline (ZOLOFT) tablet 150 mg  150 mg Oral Daily Patrecia Pour, NP   150 mg at 03/16/16 0842  . topiramate (TOPAMAX) tablet 50 mg  50 mg Oral TID Patrecia Pour, NP   50 mg at 03/16/16 4128   PTA Medications: Prescriptions prior to admission  Medication Sig Dispense Refill Last Dose  . atorvastatin (LIPITOR) 40 MG tablet Take 1 tablet (40 mg total) by mouth daily. For high cholesterol (Patient not taking: Reported on 03/15/2016)     . insulin detemir (LEVEMIR) 100 UNIT/ML injection Inject 0.26 mLs (26 Units total) into the skin at bedtime. And adjust per your  ORIGINAL prescriber: For diabetes management (Patient not taking: Reported on 03/15/2016) 1 mL 0   . losartan (COZAAR) 50 MG tablet Take 1 tablet (50 mg total) by mouth daily. For high blood pressure (Patient not taking: Reported on 03/15/2016) 30 tablet 0   . metFORMIN (GLUCOPHAGE) 500 MG tablet Take 1 tablet (500 mg total) by mouth 2 (two) times daily with a meal. For diabetes management (Patient not  taking: Reported on 03/15/2016)     . nicotine (NICODERM CQ - DOSED IN MG/24 HOURS) 21 mg/24hr patch Place 1 patch (21 mg total) onto the skin daily. For smoking cessation (Patient not taking: Reported on 03/15/2016) 28 patch 0   . pantoprazole (PROTONIX) 40 MG tablet Take 1 tablet (40 mg total) by mouth 2 (two) times daily. For acid reflux (Patient not taking: Reported on 03/15/2016)     . QUEtiapine (SEROQUEL) 100 MG tablet Take 1 tablet (100 mg total) by mouth at bedtime. For mood control (Patient not taking: Reported on 03/15/2016) 30 tablet 0   . sertraline (ZOLOFT) 50 MG tablet Take 3 tablets (150 mg total) by mouth daily. For depression (Patient not taking: Reported on 03/15/2016) 90 tablet 0   . topiramate (TOPAMAX) 50 MG tablet Take 1 tablet (50 mg total) by mouth 3 (three) times daily. For mood stabilization (Patient not taking: Reported on 03/15/2016) 90 tablet 0     Musculoskeletal: Strength & Muscle Tone: within normal limits Gait & Station: normal Patient leans: N/A  Psychiatric Specialty Exam: Physical Exam  Vitals reviewed. Psychiatric: His mood appears anxious. He exhibits a depressed mood.    Review of Systems  Constitutional: Positive for chills and diaphoresis.  Gastrointestinal: Positive for nausea.  Psychiatric/Behavioral: Positive for depression, suicidal ideas and substance abuse. Negative for hallucinations and memory loss. The patient has insomnia. The patient is not nervous/anxious.     Blood pressure 124/90, pulse 93, temperature 98.5 F (36.9 C), temperature source Oral,  resp. rate 16, height 5' 10" (1.778 m), weight 90.719 kg (200 lb), SpO2 98 %.Body mass index is 28.7 kg/(m^2).   General Appearance: Disheveled  Eye Sport and exercise psychologist:: Fair  Speech: Clear and Coherent  Volume: Decreased  Mood: Anxious and Depressed  Affect: Restricted  Thought Process: Coherent and Goal Directed  Orientation: Full (Time, Place, and Person)  Thought Content: symptoms events worries concerns  Suicidal Thoughts: Yes, plan to overdose on heroin   Homicidal Thoughts: No  Memory: Immediate; Fair Recent; Fair Remote; Fair  Judgement: Fair  Insight: Present and Shallow  Psychomotor Activity: Restlessness  Concentration: Fair  Recall: [place]  Language: Fair  Akathisia: No  Handed: Right  AIMS (if indicated):    Assets: Desire for Improvement Housing Social Support  ADL's: Intact  Cognition: WNL  Sleep: Number of Hours: 4        Treatment Plan Summary: Admit to [facility] for crisis management and mood stabilization. Medication management to re-stabilize current mood symptoms Individual counseling sessions for coping skills Medical consults as needed Review and reinstate any pertinent home medications for other health problems Start Clonidine detox protocol for opiate abuse.   Observation Level/Precautions:  Continuous Observation  Laboratory:  per ED  Psychotherapy:  Group milieu  Medications:  Per medlist  Consultations:  As needed  Discharge Concerns:  Safety, continued substance abuse   Estimated LOS:  Less than 48 hours  Other:     Elmarie Shiley, NP  3/26/201712:52 PM Agree with plan

## 2016-03-17 DIAGNOSIS — F1194 Opioid use, unspecified with opioid-induced mood disorder: Secondary | ICD-10-CM

## 2016-03-17 DIAGNOSIS — F1124 Opioid dependence with opioid-induced mood disorder: Secondary | ICD-10-CM | POA: Diagnosis not present

## 2016-03-17 DIAGNOSIS — R45851 Suicidal ideations: Secondary | ICD-10-CM

## 2016-03-17 LAB — GLUCOSE, CAPILLARY
GLUCOSE-CAPILLARY: 244 mg/dL — AB (ref 65–99)
GLUCOSE-CAPILLARY: 208 mg/dL — AB (ref 65–99)
Glucose-Capillary: 278 mg/dL — ABNORMAL HIGH (ref 65–99)

## 2016-03-17 MED ORDER — INSULIN DETEMIR 100 UNIT/ML ~~LOC~~ SOLN
10.0000 [IU] | Freq: Every day | SUBCUTANEOUS | Status: DC
  Administered 2016-03-17: 10 [IU] via SUBCUTANEOUS
  Filled 2016-03-17 (×3): qty 0.1

## 2016-03-17 NOTE — BHH Counselor (Signed)
This Clinical research associatewriter re-[facility] w/ attention to [employer] CompaniesShayla Brown for review.  Ardelle ParkLatoya McNeil, MA OBS Counselor

## 2016-03-17 NOTE — BH Assessment (Signed)
[facility]  Shalya requested to have a [facility] of the [facility].

## 2016-03-17 NOTE — Progress Notes (Signed)
Patient sleeping at time of shift change and continues to sleep at this time. Patient responded to this writer's greetings but not cooperative with responding to questions. Woke up briefly for snacks and went right back to bed. No distress noted. Staff encouraged patient to verbalize needs to staff. Constant monitoring for safety maintained.  Patient remains safe.

## 2016-03-17 NOTE — Progress Notes (Signed)
[facility] Progress Note  03/17/2016 1:54 PM David Doyle  MRN:  [id number] Subjective:    Patient states "I still feel terrible. I am having a lot of withdrawal symptoms like sweats, shakes, and nausea. I feel very depressed. But I'm not as suicidal as I was. I want to leave here and go straight to treatment so I'm not tempted to use."   Objective:   Patient is seen and chart is reviewed. The patient appears depressed and is struggling with multiple withdrawal symptoms. He is currently receiving clonidine for heroine detox and ativan as needed for alcohol abuse. Patient appears motivated for treatment and expresses a desire to achieve sobriety from illicit drugs. Discussed plan with counselor which includes the patient detoxing for one more day then going to [facility]. Patient has been accepted to [facility] on Wednesday and will be able to go there from [facility] for continued treatment. Patient continues to have some passive SI about overdosing. He reports attempting to end his life prior to admission to Observation unit. Will restart Levemir at lower dosage due to patient's poor appetite related to withdrawal.   Principal Problem: Opioid-induced mood disorder (HCC) Diagnosis:   Patient Active Problem List   Diagnosis Date Noted  . Heroin dependence (HCC) [F11.20] 03/15/2016  . 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 Alcohol dependence with uncomplicated withdrawal (HCC) [F10.230]   . Opioid type dependence, continuous (HCC) [F11.20] 01/03/2016  . Alcohol dependence (HCC) [F10.20] 01/03/2016  . PTSD (post-traumatic stress disorder) [F43.10] 01/03/2016  . Suicidal ideation [R45.851] 01/03/2016  . Severe recurrent major depression without psychotic features (HCC) [F33.2] 01/03/2016  . BACK PAIN [M54.9] 07/15/2011   Total Time spent with patient: 20 minutes  Past Psychiatric History: See H  & P  Past Medical History:  Past Medical History  Diagnosis Date  . GERD (gastroesophageal reflux disease)   . Pancreatitis   . Diabetes mellitus without complication (HCC)   . Hypertension   . Depression   . Suicidal behavior   . Substance abuse   . PTSD (post-traumatic stress disorder)   . Bipolar 1 disorder (HCC)   . Portal vein thrombosis     Past Surgical History  Procedure Laterality Date  . Tonsillectomy    . Adenoidectomy     Family History: History reviewed. No pertinent family history. Family Psychiatric  History: See H &P  Social History:  History  Alcohol Use  . Yes    Comment: occasionally     History  Drug Use  . Yes  . Special: Cocaine, IV    Comment: crack, heroin, etc    Social History   Social History  . Marital Status: Single    Spouse Name: N/A  . Number of Children: N/A  . Years of Education: N/A   Social History Main Topics  . Smoking status: Current Every Day Smoker -- 2.00 packs/day  . Smokeless tobacco: None  . Alcohol Use: Yes     Comment: occasionally  . Drug Use: Yes    Special: Cocaine, IV     Comment: crack, heroin, etc  . Sexual Activity: Yes   Other Topics Concern  . None   Social History Narrative   Additional Social History:                         Sleep: Fair  Appetite:  Fair  Current Medications: Current  Facility-Administered Medications  Medication Dose Route Frequency Provider Last Rate Last Dose  . acetaminophen (TYLENOL) tablet 650 mg  650 mg Oral Q6H PRN Charm Rings, NP      . alum & mag hydroxide-simeth (MAALOX/MYLANTA) 200-200-20 MG/5ML suspension 30 mL  30 mL Oral Q4H PRN Charm Rings, NP      . atorvastatin (LIPITOR) tablet 40 mg  40 mg Oral Daily Charm Rings, NP   40 mg at 03/17/16 0845  . cloNIDine (CATAPRES) tablet 0.1 mg  0.1 mg Oral QID Thermon Leyland, NP   0.1 mg at 03/17/16 0845   Followed by  . [START ON 03/18/2016] cloNIDine (CATAPRES) tablet 0.1 mg  0.1 mg Oral BH-qamhs  Thermon Leyland, NP       Followed by  . [START ON 03/20/2016] cloNIDine (CATAPRES) tablet 0.1 mg  0.1 mg Oral QAC breakfast Thermon Leyland, NP      . dicyclomine (BENTYL) tablet 20 mg  20 mg Oral Q6H PRN Thermon Leyland, NP   20 mg at 03/17/16 0846  . hydrOXYzine (ATARAX/VISTARIL) tablet 25 mg  25 mg Oral Q6H PRN Thermon Leyland, NP      . insulin detemir (LEVEMIR) injection 10 Units  10 Units Subcutaneous QHS Thermon Leyland, NP      . loperamide (IMODIUM) capsule 2-4 mg  2-4 mg Oral PRN Thermon Leyland, NP      . LORazepam (ATIVAN) tablet 1 mg  1 mg Oral Q6H PRN Thermon Leyland, NP   1 mg at 03/17/16 1333  . losartan (COZAAR) tablet 50 mg  50 mg Oral Daily Charm Rings, NP   50 mg at 03/17/16 0851  . magnesium hydroxide (MILK OF MAGNESIA) suspension 30 mL  30 mL Oral Daily PRN Charm Rings, NP      . metFORMIN (GLUCOPHAGE) tablet 500 mg  500 mg Oral BID WC Charm Rings, NP   500 mg at 03/17/16 4098  . methocarbamol (ROBAXIN) tablet 500 mg  500 mg Oral Q8H PRN Thermon Leyland, NP   500 mg at 03/17/16 1336  . naproxen (NAPROSYN) tablet 500 mg  500 mg Oral BID PRN Thermon Leyland, NP      . nicotine (NICODERM CQ - dosed in mg/24 hours) patch 21 mg  21 mg Transdermal Daily Charm Rings, NP   21 mg at 03/17/16 0847  . ondansetron (ZOFRAN-ODT) disintegrating tablet 4 mg  4 mg Oral Q6H PRN Thermon Leyland, NP   4 mg at 03/16/16 0953  . pantoprazole (PROTONIX) EC tablet 40 mg  40 mg Oral BID Charm Rings, NP   40 mg at 03/17/16 0845  . QUEtiapine (SEROQUEL) tablet 100 mg  100 mg Oral QHS Charm Rings, NP   100 mg at 03/16/16 2148  . sertraline (ZOLOFT) tablet 150 mg  150 mg Oral Daily Charm Rings, NP   150 mg at 03/17/16 1191  . topiramate (TOPAMAX) tablet 50 mg  50 mg Oral TID Charm Rings, NP   50 mg at 03/17/16 4782    Lab Results:  Results for orders placed or performed during the hospital encounter of 03/15/16 (from the past 48 hour(s))  Glucose, capillary     Status: Abnormal   Collection Time:  03/16/16  4:51 PM  Result Value Ref Range   Glucose-Capillary 254 (H) 65 - 99 mg/dL  Glucose, capillary     Status: Abnormal  Collection Time: 03/16/16  9:31 PM  Result Value Ref Range   Glucose-Capillary 272 (H) 65 - 99 mg/dL  Glucose, capillary     Status: Abnormal   Collection Time: 03/17/16  6:36 AM  Result Value Ref Range   Glucose-Capillary 244 (H) 65 - 99 mg/dL  Glucose, capillary     Status: Abnormal   Collection Time: 03/17/16 11:20 AM  Result Value Ref Range   Glucose-Capillary 278 (H) 65 - 99 mg/dL    Blood Alcohol level:  Lab Results  Component Value Date   ETH <5 03/15/2016   ETH 172* 02/25/2016    Physical Findings: AIMS: Facial and Oral Movements Muscles of Facial Expression: None, normal Lips and Perioral Area: None, normal Jaw: None, normal Tongue: None, normal,Extremity Movements Upper (arms, wrists, hands, fingers): None, normal Lower (legs, knees, ankles, toes): None, normal, Trunk Movements Neck, shoulders, hips: None, normal, Overall Severity Severity of abnormal movements (highest score from questions above): None, normal Incapacitation due to abnormal movements: None, normal Patient's awareness of abnormal movements (rate only patient's report): No Awareness, Dental Status Current problems with teeth and/or dentures?: No Does patient usually wear dentures?: No  CIWA:  CIWA-Ar Total: 6 COWS:  COWS Total Score: 0  Musculoskeletal: Strength & Muscle Tone: within normal limits Gait & Station: normal Patient leans: N/A  Psychiatric Specialty Exam: Review of Systems  Constitutional: Positive for malaise/fatigue and diaphoresis.  Gastrointestinal: Positive for nausea.  Neurological: Positive for tremors.  Psychiatric/Behavioral: Positive for depression, suicidal ideas and substance abuse. Negative for hallucinations and memory loss. The patient is nervous/anxious and has insomnia.     Blood pressure 109/65, pulse 90, temperature 98.6 F (37 C),  temperature source Oral, resp. rate 18, height 5\' 10"  (1.778 m), weight 90.719 kg (200 lb), SpO2 100 %.Body mass index is 28.7 kg/(m^2).  General Appearance: Disheveled  Eye Contact::  Minimal  Speech:  Clear and Coherent  Volume:  Decreased  Mood:  Dysphoric  Affect:  Constricted  Thought Process:  Goal Directed and Intact  Orientation:  Full (Time, Place, and Person)  Thought Content:  Symptoms, worries   Suicidal Thoughts:  Yes.  with intent/plan  Homicidal Thoughts:  No  Memory:  Immediate;   Good Recent;   Fair Remote;   Fair  Judgement:  Impaired  Insight:  Shallow  Psychomotor Activity:  Restlessness  Concentration:  Fair  Recall:  Fair  Fund of Knowledge:Good  Language: Good  Akathisia:  No  Handed:  Right  AIMS (if indicated):     Assets:  Communication Skills Desire for Improvement Financial Resources/Insurance Leisure Time Resilience  ADL's:  Intact  Cognition: WNL  Sleep:      Treatment Plan Summary: Daily contact with patient to assess and evaluate symptoms and progress in treatment and Medication management  -Continue clonidine detox protocol for opiate abuse -Continue ativan 1 mg every six hours prn anxiety or symptoms of alcohol withdrawal with last CIWA at six.  -Continue Seroquel 100 mg hs for insomnia -Continue Topamax 50 mg TID for mood control -Start Levemir 10 mg at hs, Metformin 500 mg bid for Type 2 Diabetes with achs blood sugar checks with recent values in the low 200's.   Fransisca KaufmannAVIS, Oanh Devivo, NP 03/17/2016, 1:54 PM

## 2016-03-18 DIAGNOSIS — F1124 Opioid dependence with opioid-induced mood disorder: Secondary | ICD-10-CM | POA: Diagnosis not present

## 2016-03-18 DIAGNOSIS — F1194 Opioid use, unspecified with opioid-induced mood disorder: Secondary | ICD-10-CM | POA: Diagnosis not present

## 2016-03-18 LAB — GLUCOSE, CAPILLARY: Glucose-Capillary: 323 mg/dL — ABNORMAL HIGH (ref 65–99)

## 2016-03-18 MED ORDER — SERTRALINE HCL 50 MG PO TABS
150.0000 mg | ORAL_TABLET | Freq: Every day | ORAL | Status: DC
  Filled 2016-03-18: qty 42

## 2016-03-18 MED ORDER — LOSARTAN POTASSIUM 50 MG PO TABS: 50.0000 mg | ORAL_TABLET | Freq: Every day | ORAL | Status: DC

## 2016-03-18 MED ORDER — ATORVASTATIN CALCIUM 40 MG PO TABS: 40.0000 mg | ORAL_TABLET | Freq: Every day | ORAL | Status: DC

## 2016-03-18 MED ORDER — INSULIN ASPART 100 UNIT/ML ~~LOC~~ SOLN
0.0000 [IU] | Freq: Three times a day (TID) | SUBCUTANEOUS | Status: DC
  Filled 2016-03-18 (×2): qty 0.15

## 2016-03-18 MED ORDER — INSULIN ASPART 100 UNIT/ML ~~LOC~~ SOLN: 0.0000 [IU] | Freq: Three times a day (TID) | SUBCUTANEOUS | Status: DC

## 2016-03-18 MED ORDER — METFORMIN HCL 500 MG PO TABS: 500.0000 mg | ORAL_TABLET | Freq: Two times a day (BID) | ORAL | Status: DC

## 2016-03-18 MED ORDER — INSULIN DETEMIR 100 UNIT/ML ~~LOC~~ SOLN: 10.0000 [IU] | Freq: Every day | SUBCUTANEOUS | Status: AC

## 2016-03-18 NOTE — Progress Notes (Signed)
[facility] called to inform writer that patient was recently prescribed Novolog sliding scale insulin [facility]. The patient has been complaining of withdrawal symptoms over weekend but is now eating better. Will reinstate the Novolog SSI moderate. Patient reports he was recently not taking his insulin for a period of two weeks due to running out. Informed [facility] staff of change to his medication regimen. His last blood glucose that was checked last night was 208.

## 2016-03-18 NOTE — Progress Notes (Signed)
Nursing Discharge Note:  Patient continuous to deny any SI?HI/AVH and is being [facility]. Patient receiving one copy of the AVS Discharge Summary and also signing an identical Summary to go on the chart> Patient also signing for and receiving his checked belongings from locker. Patient being sent with a 14 days' supply of [facility]. Patient escorted to Lobby by staff member to [facility]. Has signed and received all apropriate required items.

## 2016-03-18 NOTE — Progress Notes (Signed)
[facility]David Doyle 45 yo hispanic male in bed 5.  Pt is alert and awak and oriented x 4.  Pt DX: Opoid induced mood disorder.  Pt complains of lots of pain and withdrawal symptoms previously but states he feels better now.  Pt complains of depression and is detoxing at this time.  Plan for Pt is discharge to [facility].  Pt sts he is homeless but he does have a wife and home.  Pt usually prefers to stay in shelters.  Pt currently denies SI, HI and AVH. Pt will be continuously monitored for safety except during bathroom breaks.

## 2016-03-18 NOTE — Progress Notes (Signed)
Nursing Shift Note:  Patient very drowsy when Nurse rounding on each patient to collect current vital signs and administer morning meds, but is alert and cooperative, quiet but denies any SI/HI/AVH but admits to continuing depression and anxiety and contracts for safety in that he will alert staff piror to engaging in any self harm should he develop any such thougthts. Nurse administering medications, providing emotional support, and ensuring patient is continuously observed for safety except when in bathroom. Patient remains safe on Unit.
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
## 2016-05-25 ENCOUNTER — Emergency Department (HOSPITAL_COMMUNITY)
Admission: EM | Admit: 2016-05-25 | Discharge: 2016-05-26 | Disposition: A | Discharge status: Home / Self Care | Payer: Medicaid Other | Attending: Emergency Medicine | Admitting: Emergency Medicine

## 2016-05-25 ENCOUNTER — Encounter (HOSPITAL_COMMUNITY): Payer: Self-pay | Admitting: Emergency Medicine

## 2016-05-25 DIAGNOSIS — I10 Essential (primary) hypertension: Secondary | ICD-10-CM | POA: Diagnosis not present

## 2016-05-25 DIAGNOSIS — F319 Bipolar disorder, unspecified: Secondary | ICD-10-CM | POA: Insufficient documentation

## 2016-05-25 DIAGNOSIS — E119 Type 2 diabetes mellitus without complications: Secondary | ICD-10-CM | POA: Insufficient documentation

## 2016-05-25 DIAGNOSIS — Z794 Long term (current) use of insulin: Secondary | ICD-10-CM | POA: Diagnosis not present

## 2016-05-25 DIAGNOSIS — F332 Major depressive disorder, recurrent severe without psychotic features: Secondary | ICD-10-CM | POA: Diagnosis present

## 2016-05-25 DIAGNOSIS — F10988 Alcohol use, unspecified with other alcohol-induced disorder: Secondary | ICD-10-CM

## 2016-05-25 DIAGNOSIS — F918 Other conduct disorders: Secondary | ICD-10-CM | POA: Diagnosis not present

## 2016-05-25 DIAGNOSIS — F10120 Alcohol abuse with intoxication, uncomplicated: Secondary | ICD-10-CM | POA: Diagnosis present

## 2016-05-25 DIAGNOSIS — Z7984 Long term (current) use of oral hypoglycemic drugs: Secondary | ICD-10-CM | POA: Insufficient documentation

## 2016-05-25 DIAGNOSIS — F4310 Post-traumatic stress disorder, unspecified: Secondary | ICD-10-CM | POA: Diagnosis present

## 2016-05-25 DIAGNOSIS — F10188 Alcohol abuse with other alcohol-induced disorder: Secondary | ICD-10-CM | POA: Insufficient documentation

## 2016-05-25 DIAGNOSIS — F17200 Nicotine dependence, unspecified, uncomplicated: Secondary | ICD-10-CM | POA: Diagnosis not present

## 2016-05-25 DIAGNOSIS — F10230 Alcohol dependence with withdrawal, uncomplicated: Secondary | ICD-10-CM | POA: Diagnosis present

## 2016-05-25 LAB — COMPREHENSIVE METABOLIC PANEL
ALBUMIN: 4.4 g/dL (ref 3.5–5.0)
ALK PHOS: 91 U/L (ref 38–126)
ALT: 29 U/L (ref 17–63)
AST: 31 U/L (ref 15–41)
Anion gap: 16 — ABNORMAL HIGH (ref 5–15)
BILIRUBIN TOTAL: 0.7 mg/dL (ref 0.3–1.2)
BUN: 8 mg/dL (ref 6–20)
CALCIUM: 9.3 mg/dL (ref 8.9–10.3)
CO2: 23 mmol/L (ref 22–32)
Chloride: 98 mmol/L — ABNORMAL LOW (ref 101–111)
Creatinine, Ser: 0.86 mg/dL (ref 0.61–1.24)
GFR calc Af Amer: >60 mL/min (ref >60)
GFR calc non Af Amer: >60 mL/min (ref >60)
GLUCOSE: 246 mg/dL — AB (ref 65–99)
Potassium: 2.9 mmol/L — ABNORMAL LOW (ref 3.5–5.1)
Sodium: 137 mmol/L (ref 135–145)
TOTAL PROTEIN: 7.3 g/dL (ref 6.5–8.1)

## 2016-05-25 LAB — CBC WITH DIFFERENTIAL/PLATELET
BASOS ABS: 0 10*3/uL (ref 0.0–0.1)
BASOS PCT: 0 %
Eosinophils Absolute: 0.1 10*3/uL (ref 0.0–0.7)
Eosinophils Relative: 1 %
HEMATOCRIT: 40.7 % (ref 39.0–52.0)
HEMOGLOBIN: 14.7 g/dL (ref 13.0–17.0)
LYMPHS PCT: 41 %
Lymphs Abs: 4 10*3/uL (ref 0.7–4.0)
MCH: 30.4 pg (ref 26.0–34.0)
MCHC: 36.1 g/dL — AB (ref 30.0–36.0)
MCV: 84.3 fL (ref 78.0–100.0)
MONOS PCT: 7 %
Monocytes Absolute: 0.7 10*3/uL (ref 0.1–1.0)
NEUTROS ABS: 4.9 10*3/uL (ref 1.7–7.7)
NEUTROS PCT: 51 %
Platelets: 312 10*3/uL (ref 150–400)
RBC: 4.83 MIL/uL (ref 4.22–5.81)
RDW: 13.6 % (ref 11.5–15.5)
WBC: 9.6 10*3/uL (ref 4.0–10.5)

## 2016-05-25 LAB — ETHANOL: Alcohol, Ethyl (B): 307 mg/dL (ref <5)

## 2016-05-25 LAB — BLOOD GAS, VENOUS
ACID-BASE DEFICIT: 0.4 mmol/L (ref 0.0–2.0)
BICARBONATE: 23.4 meq/L (ref 20.0–24.0)
O2 SAT: 92.9 %
PATIENT TEMPERATURE: 98.6
TCO2: 20.6 mmol/L (ref 0–100)
pCO2, Ven: 37.8 mmHg — ABNORMAL LOW (ref 45.0–50.0)
pH, Ven: 7.409 — ABNORMAL HIGH (ref 7.250–7.300)
pO2, Ven: 70 mmHg — ABNORMAL HIGH (ref 31.0–45.0)

## 2016-05-25 LAB — CK: Total CK: 518 U/L — ABNORMAL HIGH (ref 49–397)

## 2016-05-25 LAB — RAPID URINE DRUG SCREEN, HOSP PERFORMED
Amphetamines: NOT DETECTED
Barbiturates: NOT DETECTED
Benzodiazepines: POSITIVE — AB
Cocaine: NOT DETECTED
OPIATES: NOT DETECTED
TETRAHYDROCANNABINOL: NOT DETECTED

## 2016-05-25 LAB — CBG MONITORING, ED
GLUCOSE-CAPILLARY: 278 mg/dL — AB (ref 65–99)
Glucose-Capillary: 193 mg/dL — ABNORMAL HIGH (ref 65–99)

## 2016-05-25 MED ORDER — INSULIN DETEMIR 100 UNIT/ML ~~LOC~~ SOLN
10.0000 [IU] | Freq: Every day | SUBCUTANEOUS | Status: DC
  Administered 2016-05-26: 10 [IU] via SUBCUTANEOUS
  Filled 2016-05-25: qty 0.1

## 2016-05-25 MED ORDER — INSULIN ASPART 100 UNIT/ML ~~LOC~~ SOLN
0.0000 [IU] | Freq: Three times a day (TID) | SUBCUTANEOUS | Status: DC
  Administered 2016-05-26: 2 [IU] via SUBCUTANEOUS

## 2016-05-25 MED ORDER — ZIPRASIDONE MESYLATE 20 MG IM SOLR
20.0000 mg | Freq: Once | INTRAMUSCULAR | Status: AC
  Administered 2016-05-25: 20 mg via INTRAMUSCULAR

## 2016-05-25 MED ORDER — METFORMIN HCL 500 MG PO TABS
500.0000 mg | ORAL_TABLET | Freq: Two times a day (BID) | ORAL | Status: DC
  Administered 2016-05-26: 500 mg via ORAL
  Filled 2016-05-25 (×3): qty 1

## 2016-05-25 MED ORDER — NICOTINE 21 MG/24HR TD PT24: 21.0000 mg | MEDICATED_PATCH | Freq: Every day | TRANSDERMAL | Status: DC

## 2016-05-25 MED ORDER — ALUM & MAG HYDROXIDE-SIMETH 200-200-20 MG/5ML PO SUSP: 30.0000 mL | ORAL | Status: DC | PRN

## 2016-05-25 MED ORDER — SODIUM CHLORIDE 0.9 % IV SOLN
INTRAVENOUS | Status: DC
  Administered 2016-05-25 – 2016-05-26 (×2): via INTRAVENOUS

## 2016-05-25 MED ORDER — SODIUM CHLORIDE 0.9 % IV BOLUS (SEPSIS)
2500.0000 mL | Freq: Once | INTRAVENOUS | Status: AC
  Administered 2016-05-25: 2500 mL via INTRAVENOUS

## 2016-05-25 MED ORDER — ZIPRASIDONE MESYLATE 20 MG IM SOLR: 10.0000 mg | Freq: Once | INTRAMUSCULAR | Status: DC

## 2016-05-25 MED ORDER — LORAZEPAM 1 MG PO TABS: 0.0000 mg | ORAL_TABLET | Freq: Two times a day (BID) | ORAL | Status: DC

## 2016-05-25 MED ORDER — LORAZEPAM 1 MG PO TABS
0.0000 mg | ORAL_TABLET | Freq: Four times a day (QID) | ORAL | Status: DC
  Administered 2016-05-26 (×2): 1 mg via ORAL
  Filled 2016-05-25 (×2): qty 1

## 2016-05-25 MED ORDER — LOSARTAN POTASSIUM 50 MG PO TABS
50.0000 mg | ORAL_TABLET | Freq: Every day | ORAL | Status: DC
  Administered 2016-05-26: 50 mg via ORAL
  Filled 2016-05-25: qty 1

## 2016-05-25 MED ORDER — SERTRALINE HCL 50 MG PO TABS
150.0000 mg | ORAL_TABLET | Freq: Every day | ORAL | Status: DC
  Administered 2016-05-26: 150 mg via ORAL
  Filled 2016-05-25: qty 3

## 2016-05-25 MED ORDER — TOPIRAMATE 25 MG PO TABS
50.0000 mg | ORAL_TABLET | Freq: Three times a day (TID) | ORAL | Status: DC
  Administered 2016-05-26: 50 mg via ORAL
  Filled 2016-05-25: qty 2

## 2016-05-25 MED ORDER — QUETIAPINE FUMARATE 100 MG PO TABS
100.0000 mg | ORAL_TABLET | Freq: Every day | ORAL | Status: DC
  Filled 2016-05-25: qty 1

## 2016-05-25 MED ORDER — ATORVASTATIN CALCIUM 40 MG PO TABS
40.0000 mg | ORAL_TABLET | Freq: Every day | ORAL | Status: DC
  Administered 2016-05-26: 40 mg via ORAL
  Filled 2016-05-25: qty 1

## 2016-05-25 NOTE — ED Notes (Signed)
Bed: RESA Expected date:  Expected time:  Means of arrival:  Comments: EMS- ETOH, combative

## 2016-05-25 NOTE — ED Notes (Addendum)
Patient is resting with eyes closed. Have made attempts to arouse patient, he will moan or respond with one word answers and go right back to sleep. Appears in no acute distress.

## 2016-05-25 NOTE — BH Assessment (Signed)
Pt is drowsy and unable to be assess at this time. Will assess pt when he is alert.

## 2016-05-25 NOTE — ED Notes (Signed)
Pt found in a pool of urine.  Full linen change completed and condom cath applied.

## 2016-05-25 NOTE — ED Notes (Signed)
Clothing removed from room and placed at nurses station. 1 pr tennis shoes, socks, jeans and tshirt.

## 2016-05-25 NOTE — ED Notes (Signed)
Pt pulled off condom cath and urinated in floor.
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
## 2016-05-26 LAB — CBG MONITORING, ED: GLUCOSE-CAPILLARY: 142 mg/dL — AB (ref 65–99)

## 2016-05-26 MED ORDER — PRAZOSIN HCL 2 MG PO CAPS
2.0000 mg | ORAL_CAPSULE | Freq: Every day | ORAL | Status: DC
  Filled 2016-05-26: qty 1

## 2016-05-26 MED ORDER — INSULIN ASPART 100 UNIT/ML ~~LOC~~ SOLN
0.0000 [IU] | Freq: Three times a day (TID) | SUBCUTANEOUS | Status: DC
  Filled 2016-05-26: qty 1

## 2016-05-26 MED ORDER — INSULIN DETEMIR 100 UNIT/ML ~~LOC~~ SOLN: 26.0000 [IU] | Freq: Every day | SUBCUTANEOUS | Status: DC

## 2016-05-26 MED ORDER — INSULIN ASPART 100 UNIT/ML ~~LOC~~ SOLN: 0.0000 [IU] | Freq: Every day | SUBCUTANEOUS | Status: DC

## 2016-05-26 MED ORDER — TOPIRAMATE 25 MG PO TABS: 50.0000 mg | ORAL_TABLET | Freq: Two times a day (BID) | ORAL | Status: DC

## 2016-05-26 NOTE — BH Assessment (Signed)
[facility] Assessment Progress Note Patient requested discharge this date stating he had contacted the [facility] in [place] after his assessment and has been accepted into their program to assist with his substance abuse issues. [facility]. Patient denies any active plan to harm himself and stated earlier this date he was "never" suicidal and had only been "having thoughts without a plan to act on it." Case was staffed with Shaune PollackLord DNP who stated patient could be discharged if requested. This Clinical research associatewriter and LIU MD spoke to patient with patient denying any thoughts of self harm. Patient declined any medications on discharge. This Clinical research associatewriter did discuss relapse prevention with patient and contacted the [facility] to inform them that patient would be arriving this date. Other outpatient/inpatient resources were also provided on discharged. This Clinical research associatewriter also contacted patient's wife (while patient was present giving consent) and informed her of patient's discharge plans.

## 2016-05-26 NOTE — ED Notes (Signed)
Woke patient to assess and give medication, patient stated he felt too bad to talk right now. When asked how he felt he stated he had a pounding headache. Patient took medication and then asked for a sandwich, patient fell back to sleep before sandwich could be given.

## 2016-05-26 NOTE — ED Notes (Signed)
Pt ambulated to RR unassisted 

## 2016-05-26 NOTE — ED Notes (Signed)
Bed: WA29 Expected date:  Expected time:  Means of arrival:  Comments: RM 16

## 2016-05-26 NOTE — ED Notes (Signed)
Pt ambulated to BR with minimal assistance, pt calm and cooperative at this time
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
## 2016-12-26 ENCOUNTER — Emergency Department (HOSPITAL_COMMUNITY)
Admission: EM | Admit: 2016-12-26 | Discharge: 2016-12-26 | Disposition: A | Discharge status: Home / Self Care | Payer: Medicaid Other | Attending: Emergency Medicine | Admitting: Emergency Medicine

## 2016-12-26 ENCOUNTER — Encounter (HOSPITAL_COMMUNITY): Payer: Self-pay | Admitting: Emergency Medicine

## 2016-12-26 DIAGNOSIS — F17200 Nicotine dependence, unspecified, uncomplicated: Secondary | ICD-10-CM | POA: Insufficient documentation

## 2016-12-26 DIAGNOSIS — R1012 Left upper quadrant pain: Secondary | ICD-10-CM | POA: Diagnosis present

## 2016-12-26 DIAGNOSIS — Z794 Long term (current) use of insulin: Secondary | ICD-10-CM | POA: Insufficient documentation

## 2016-12-26 DIAGNOSIS — I10 Essential (primary) hypertension: Secondary | ICD-10-CM | POA: Insufficient documentation

## 2016-12-26 DIAGNOSIS — E119 Type 2 diabetes mellitus without complications: Secondary | ICD-10-CM | POA: Diagnosis not present

## 2016-12-26 DIAGNOSIS — Z79899 Other long term (current) drug therapy: Secondary | ICD-10-CM | POA: Diagnosis not present

## 2016-12-26 LAB — COMPREHENSIVE METABOLIC PANEL
ALBUMIN: 4 g/dL (ref 3.5–5.0)
ALT: 51 U/L (ref 17–63)
ANION GAP: 12 (ref 5–15)
AST: 129 U/L — ABNORMAL HIGH (ref 15–41)
Alkaline Phosphatase: 102 U/L (ref 38–126)
BILIRUBIN TOTAL: 0.6 mg/dL (ref 0.3–1.2)
BUN: 13 mg/dL (ref 6–20)
CO2: 25 mmol/L (ref 22–32)
Calcium: 9.4 mg/dL (ref 8.9–10.3)
Chloride: 97 mmol/L — ABNORMAL LOW (ref 101–111)
Creatinine, Ser: 0.53 mg/dL — ABNORMAL LOW (ref 0.61–1.24)
GFR calc Af Amer: >60 mL/min (ref >60)
Glucose, Bld: 114 mg/dL — ABNORMAL HIGH (ref 65–99)
POTASSIUM: 4 mmol/L (ref 3.5–5.1)
Sodium: 134 mmol/L — ABNORMAL LOW (ref 135–145)
TOTAL PROTEIN: 7.5 g/dL (ref 6.5–8.1)

## 2016-12-26 LAB — URINALYSIS, ROUTINE W REFLEX MICROSCOPIC
BILIRUBIN URINE: NEGATIVE
Bacteria, UA: NONE SEEN
Glucose, UA: NEGATIVE mg/dL
Hgb urine dipstick: NEGATIVE
Ketones, ur: 5 mg/dL — AB
LEUKOCYTES UA: NEGATIVE
Nitrite: NEGATIVE
PH: 6 (ref 5.0–8.0)
Protein, ur: >=300 mg/dL — AB
SPECIFIC GRAVITY, URINE: 1.024 (ref 1.005–1.030)
SQUAMOUS EPITHELIAL / LPF: NONE SEEN

## 2016-12-26 LAB — CBC WITH DIFFERENTIAL/PLATELET
Basophils Absolute: 0 10*3/uL (ref 0.0–0.1)
Basophils Relative: 1 %
Eosinophils Absolute: 0 10*3/uL (ref 0.0–0.7)
Eosinophils Relative: 1 %
HCT: 40.3 % (ref 39.0–52.0)
HEMOGLOBIN: 14.3 g/dL (ref 13.0–17.0)
LYMPHS ABS: 1.4 10*3/uL (ref 0.7–4.0)
Lymphocytes Relative: 24 %
MCH: 31.2 pg (ref 26.0–34.0)
MCHC: 35.5 g/dL (ref 30.0–36.0)
MCV: 87.8 fL (ref 78.0–100.0)
MONOS PCT: 6 %
Monocytes Absolute: 0.4 10*3/uL (ref 0.1–1.0)
NEUTROS PCT: 68 %
Neutro Abs: 4.1 10*3/uL (ref 1.7–7.7)
Platelets: 192 10*3/uL (ref 150–400)
RBC: 4.59 MIL/uL (ref 4.22–5.81)
RDW: 12.4 % (ref 11.5–15.5)
WBC: 6 10*3/uL (ref 4.0–10.5)

## 2016-12-26 LAB — RAPID URINE DRUG SCREEN, HOSP PERFORMED
AMPHETAMINES: NOT DETECTED
Barbiturates: NOT DETECTED
Benzodiazepines: NOT DETECTED
COCAINE: NOT DETECTED
Opiates: NOT DETECTED
TETRAHYDROCANNABINOL: POSITIVE — AB

## 2016-12-26 LAB — ETHANOL: Alcohol, Ethyl (B): 118 mg/dL — ABNORMAL HIGH (ref <5)

## 2016-12-26 LAB — LIPASE, BLOOD: LIPASE: 49 U/L (ref 11–51)

## 2016-12-26 MED ORDER — ONDANSETRON HCL 4 MG PO TABS: 4.0000 mg | ORAL_TABLET | Freq: Four times a day (QID) | ORAL | 0 refills | Status: DC

## 2016-12-26 MED ORDER — GI COCKTAIL ~~LOC~~
30.0000 mL | Freq: Once | ORAL | Status: AC
  Administered 2016-12-26: 30 mL via ORAL
  Filled 2016-12-26: qty 30

## 2016-12-26 MED ORDER — LORAZEPAM 2 MG/ML IJ SOLN
1.0000 mg | Freq: Once | INTRAMUSCULAR | Status: AC
  Administered 2016-12-26: 1 mg via INTRAVENOUS
  Filled 2016-12-26: qty 1

## 2016-12-26 MED ORDER — HYDROMORPHONE HCL 1 MG/ML IJ SOLN
1.0000 mg | Freq: Once | INTRAMUSCULAR | Status: AC
  Administered 2016-12-26: 1 mg via INTRAVENOUS
  Filled 2016-12-26: qty 1

## 2016-12-26 MED ORDER — FAMOTIDINE IN NACL 20-0.9 MG/50ML-% IV SOLN
20.0000 mg | Freq: Once | INTRAVENOUS | Status: AC
  Administered 2016-12-26: 20 mg via INTRAVENOUS
  Filled 2016-12-26: qty 50

## 2016-12-26 MED ORDER — ONDANSETRON HCL 4 MG/2ML IJ SOLN
4.0000 mg | Freq: Once | INTRAMUSCULAR | Status: AC
  Administered 2016-12-26: 4 mg via INTRAVENOUS
  Filled 2016-12-26: qty 2

## 2016-12-26 MED ORDER — KETOROLAC TROMETHAMINE 30 MG/ML IJ SOLN: 30.0000 mg | Freq: Once | INTRAMUSCULAR | Status: DC

## 2016-12-26 MED ORDER — SODIUM CHLORIDE 0.9 % IV BOLUS (SEPSIS)
1000.0000 mL | Freq: Once | INTRAVENOUS | Status: AC
  Administered 2016-12-26: 1000 mL via INTRAVENOUS

## 2016-12-26 NOTE — ED Notes (Signed)
Nurse is going in to start and IV and collect labs

## 2016-12-26 NOTE — ED Notes (Signed)
Bed: WA19 Expected date:  Expected time:  Means of arrival:  Comments: EMS/n/v 

## 2016-12-26 NOTE — Discharge Instructions (Signed)
Please avoid alcohol Drink clear liquids for the next several days until you can tolerate solid foods Return if you have worsening symptoms

## 2016-12-26 NOTE — ED Triage Notes (Signed)
Per EMS, [facility] from [facility], c/o N/V and LUQ abdominal pain x3 days. Hx pancreatitis. Denies chest pain and SOB. Ambulatory with EMS.

## 2016-12-26 NOTE — ED Provider Notes (Signed)
[facility]-EMERGENCY DEPT Provider Note   CSN: [id number] Arrival date & time: 12/26/16  1105   History   Chief Complaint Chief Complaint  Patient presents with  . Abdominal Pain    HPI David Doyle is a 46 y.o. male who presents with abdominal pain. PMH significant for insulin dependent DM, hx of alcohol abuse, bipolar d/o, poly substance abuse, hx of pancreatitis. CT of A&P at outside facility in September 2017 showed enteritis, bladder wall thickening, and 1.4cm right kidney lesion. He states that his last drink was several days ago. Over the past couple days he has had N/V and LUQ pain. He states it is similar to when he's had pancreatitis in the past. He states he also uses marijuana but denies any other illegal or illicit drug use. He has not been able to keep anything down for the past 2 days. Also reports chills, sweats, headache, lightheadedness. Denies fever, chest pain, SOB, lower abdominal pain, flank pain, melena/hematochezia, diarrhea/constipation, urinary symptoms. Of note, he has been out of all his meds including insulin and states he has not been taking care of himself. He is currently living with a friend. Denies previous abdominal surgeries.   HPI  Past Medical History:  Diagnosis Date  . Bipolar 1 disorder (HCC)   . Depression   . Diabetes mellitus without complication (HCC)   . GERD (gastroesophageal reflux disease)   . Hypertension   . Pancreatitis   . Portal vein thrombosis   . PTSD (post-traumatic stress disorder)   . Substance abuse   . Suicidal behavior     Patient Active Problem List   Diagnosis Date Noted  . Heroin dependence (HCC) 03/15/2016  . 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 Opioid type dependence, continuous (HCC) 01/03/2016  . Alcohol dependence (HCC) 01/03/2016  . PTSD (post-traumatic stress disorder) 01/03/2016  . Suicidal ideation 01/03/2016  . Severe  recurrent major depression without psychotic features (HCC) 01/03/2016  . BACK PAIN 07/15/2011    Past Surgical History:  Procedure Laterality Date  . ADENOIDECTOMY    . TONSILLECTOMY       Home Medications    Prior to Admission medications   Medication Sig Start Date End Date Taking? Authorizing Provider  atorvastatin (LIPITOR) 40 MG tablet Take 1 tablet (40 mg total) by mouth daily. For high cholesterol Patient not taking: Reported on 05/26/2016 03/18/16   Thermon Leyland, NP  insulin aspart (NOVOLOG) 100 UNIT/ML injection Inject 0-15 Units into the skin 3 (three) times daily with meals.       CBG 70 - 120: 0 units    CBG 121 - 150: 2 units    CBG 151 - 200: 3 units    CBG 201 - 250: 5 units    CBG 251 - 300: 8 units    CBG 301 - 350: 11 units    CBG >400: 15 units    Patient not taking: Reported on 05/26/2016 03/18/16   Nelly Rout, MD  insulin detemir (LEVEMIR) 100 UNIT/ML injection Inject 0.1 mLs (10 Units total) into the skin at bedtime. Patient taking differently: Inject 26 Units into the skin at bedtime.  03/18/16   Thermon Leyland, NP  losartan (COZAAR) 50 MG tablet Take 1 tablet (50 mg total) by mouth daily. Patient not taking: Reported on 05/26/2016 03/18/16   Thermon Leyland, NP  metFORMIN (GLUCOPHAGE) 500 MG tablet Take 1 tablet (500 mg total) by  mouth 2 (two) times daily with a meal. For diabetes management 03/18/16   Thermon LeylandLaura A Davis, NP  nicotine (NICODERM CQ - DOSED IN MG/24 HOURS) 21 mg/24hr patch Place 1 patch (21 mg total) onto the skin daily. For smoking cessation Patient not taking: Reported on 03/15/2016 03/03/16   Sanjuana KavaAgnes I Nwoko, NP  pantoprazole (PROTONIX) 40 MG tablet Take 1 tablet (40 mg total) by mouth 2 (two) times daily. For acid reflux Patient not taking: Reported on 03/15/2016 03/03/16   Sanjuana KavaAgnes I Nwoko, NP  prazosin (MINIPRESS) 2 MG capsule Take 2 mg by mouth at bedtime.    Historical Provider, MD  QUEtiapine (SEROQUEL) 100 MG tablet Take 1 tablet (100  mg total) by mouth at bedtime. For mood control Patient not taking: Reported on 05/26/2016 03/03/16   Sanjuana KavaAgnes I Nwoko, NP  sertraline (ZOLOFT) 100 MG tablet Take 150 mg by mouth daily.    Historical Provider, MD  sertraline (ZOLOFT) 50 MG tablet Take 3 tablets (150 mg total) by mouth daily. For depression Patient not taking: Reported on 05/26/2016 03/03/16   Sanjuana KavaAgnes I Nwoko, NP  topiramate (TOPAMAX) 50 MG tablet Take 1 tablet (50 mg total) by mouth 3 (three) times daily. For mood stabilization Patient not taking: Reported on 03/15/2016 03/03/16   Sanjuana KavaAgnes I Nwoko, NP    Family History No family history on file.  Social History Social History  Substance Use Topics  . Smoking status: Current Every Day Smoker    Packs/day: 2.00  . Smokeless tobacco: Not on file  . Alcohol use Yes     Comment: occasionally     Allergies   Patient has no known allergies.   Review of Systems Review of Systems  Constitutional: Positive for chills and diaphoresis. Negative for fever.  Respiratory: Negative for shortness of breath.   Cardiovascular: Negative for chest pain.  Gastrointestinal: Positive for abdominal pain, nausea and vomiting. Negative for blood in stool, constipation and diarrhea.  Genitourinary: Negative for dysuria and flank pain.  Neurological: Positive for light-headedness and headaches. Negative for syncope.  All other systems reviewed and are negative.    Physical Exam Updated Vital Signs BP (!) 154/105 (BP Location: Left Arm)   Pulse 109   Temp 97.9 F (36.6 C) (Oral)   Resp 18   Ht 5\' 10"  (1.778 m)   Wt 86.2 kg   SpO2 97%   BMI 27.26 kg/m   Physical Exam  Constitutional: He is oriented to person, place, and time. He appears well-developed and well-nourished. No distress.  Disheveled  HENT:  Head: Normocephalic and atraumatic.  Eyes: Conjunctivae are normal. Pupils are equal, round, and reactive to light. Right eye exhibits no discharge. Left eye exhibits no discharge. No  scleral icterus.  Neck: Normal range of motion.  Cardiovascular: Normal pulses.  Tachycardia present.  Exam reveals no gallop and no friction rub.   No murmur heard. Pulmonary/Chest: Effort normal and breath sounds normal. No respiratory distress. He has no wheezes. He has no rales. He exhibits no tenderness.  Abdominal: Soft. Bowel sounds are normal. He exhibits no distension and no mass. There is tenderness. There is no rebound and no guarding. No hernia.  Epigastric and LUQ tenderness  Neurological: He is alert and oriented to person, place, and time.  Skin: Skin is warm and dry. He is not diaphoretic.  Psychiatric: His behavior is normal. He exhibits a depressed mood.  Nursing note and vitals reviewed.    ED Treatments / Results  Labs (all  labs ordered are listed, but only abnormal results are displayed) Labs Reviewed  URINALYSIS, ROUTINE W REFLEX MICROSCOPIC - Abnormal; Notable for the following:       Result Value   Ketones, ur 5 (*)    Protein, ur >=300 (*)    All other components within normal limits  ETHANOL - Abnormal; Notable for the following:    Alcohol, Ethyl (B) 118 (*)    All other components within normal limits  RAPID URINE DRUG SCREEN, HOSP PERFORMED - Abnormal; Notable for the following:    Tetrahydrocannabinol POSITIVE (*)    All other components within normal limits  COMPREHENSIVE METABOLIC PANEL - Abnormal; Notable for the following:    Sodium 134 (*)    Chloride 97 (*)    Glucose, Bld 114 (*)    Creatinine, Ser 0.53 (*)    AST 129 (*)    All other components within normal limits  CBC WITH DIFFERENTIAL/PLATELET  LIPASE, BLOOD    EKG  EKG Interpretation None       Radiology No results found.  Procedures Procedures (including critical care time)  Medications Ordered in ED Medications  sodium chloride 0.9 % bolus 1,000 mL (0 mLs Intravenous Stopped 12/26/16 1348)  ondansetron (ZOFRAN) injection 4 mg (4 mg Intravenous Given 12/26/16 1206)    famotidine (PEPCID) IVPB 20 mg premix (0 mg Intravenous Stopped 12/26/16 1233)  HYDROmorphone (DILAUDID) injection 1 mg (1 mg Intravenous Given 12/26/16 1207)  HYDROmorphone (DILAUDID) injection 1 mg (1 mg Intravenous Given 12/26/16 1348)  LORazepam (ATIVAN) injection 1 mg (1 mg Intravenous Given 12/26/16 1349)  gi cocktail (Maalox,Lidocaine,Donnatal) (30 mLs Oral Given 12/26/16 1348)     Initial Impression / Assessment and Plan / ED Course  I have reviewed the triage vital signs and the nursing notes.  Pertinent labs & imaging results that were available during my care of the patient were reviewed by me and considered in my medical decision making (see chart for details).  Clinical Course    46 year old male with abdominal pain, N/V. He is tachycardic and hypertensive. Otherwise vitals are normal. He has LUQ tenderness. ETOH level is 114. CBC unremarkable. CMP remarkable for mild hyponatremia, mild hypochloremia, and elevated AST (129). Lipase is normal. UA clean. UDS remarkable for THC. 1mg  Dilaudid, Pepcid, Zofran, IVF given.  On recheck, patient is still reporting pain. 1mg  Dilaudid given and Ativan as well as GI cocktail.  On recheck, patient reports improvement. Abdomen is soft and benign. Encouraged clear liquid diet for several days until he can tolerate PO without nausea or pain. Zofran rx given. Patient is NAD, non-toxic, with stable VS. Patient is informed of clinical course, understands medical decision making process, and agrees with plan. Opportunity for questions provided and all questions answered. Return precautions given.    Final Clinical Impressions(s) / ED Diagnoses   Final diagnoses:  LUQ pain    New Prescriptions Discharge Medication List as of 12/26/2016  3:32 PM    START taking these medications   Details  ondansetron (ZOFRAN) 4 MG tablet Take 1 tablet (4 mg total) by mouth every 6 (six) hours., Starting Fri 12/26/2016, Print         Bethel Born,  PA-C 12/27/16 1032    Lorre Nick, MD 12/27/16 1146

## 2017-01-02 ENCOUNTER — Emergency Department (HOSPITAL_COMMUNITY)
Admission: EM | Admit: 2017-01-02 | Discharge: 2017-01-02 | Payer: Medicaid Other | Attending: Emergency Medicine | Admitting: Emergency Medicine

## 2017-01-02 ENCOUNTER — Encounter (HOSPITAL_COMMUNITY): Payer: Self-pay | Admitting: *Deleted

## 2017-01-02 DIAGNOSIS — E119 Type 2 diabetes mellitus without complications: Secondary | ICD-10-CM | POA: Insufficient documentation

## 2017-01-02 DIAGNOSIS — I10 Essential (primary) hypertension: Secondary | ICD-10-CM | POA: Diagnosis not present

## 2017-01-02 DIAGNOSIS — Z794 Long term (current) use of insulin: Secondary | ICD-10-CM | POA: Diagnosis not present

## 2017-01-02 DIAGNOSIS — Z79899 Other long term (current) drug therapy: Secondary | ICD-10-CM | POA: Insufficient documentation

## 2017-01-02 DIAGNOSIS — F10120 Alcohol abuse with intoxication, uncomplicated: Secondary | ICD-10-CM | POA: Diagnosis present

## 2017-01-02 DIAGNOSIS — F17200 Nicotine dependence, unspecified, uncomplicated: Secondary | ICD-10-CM | POA: Diagnosis not present

## 2017-01-02 DIAGNOSIS — F10920 Alcohol use, unspecified with intoxication, uncomplicated: Secondary | ICD-10-CM

## 2017-01-02 LAB — RAPID URINE DRUG SCREEN, HOSP PERFORMED
AMPHETAMINES: NOT DETECTED
BARBITURATES: NOT DETECTED
Benzodiazepines: NOT DETECTED
Cocaine: NOT DETECTED
Opiates: NOT DETECTED
Tetrahydrocannabinol: NOT DETECTED

## 2017-01-02 LAB — COMPREHENSIVE METABOLIC PANEL
ALBUMIN: 3.9 g/dL (ref 3.5–5.0)
ALK PHOS: 116 U/L (ref 38–126)
ALT: 28 U/L (ref 17–63)
AST: 47 U/L — ABNORMAL HIGH (ref 15–41)
Anion gap: 13 (ref 5–15)
BUN: 10 mg/dL (ref 6–20)
CALCIUM: 8.9 mg/dL (ref 8.9–10.3)
CHLORIDE: 106 mmol/L (ref 101–111)
CO2: 23 mmol/L (ref 22–32)
CREATININE: 0.69 mg/dL (ref 0.61–1.24)
GFR calc Af Amer: >60 mL/min (ref >60)
GFR calc non Af Amer: >60 mL/min (ref >60)
GLUCOSE: 118 mg/dL — AB (ref 65–99)
Potassium: 3.5 mmol/L (ref 3.5–5.1)
SODIUM: 142 mmol/L (ref 135–145)
Total Bilirubin: 0.4 mg/dL (ref 0.3–1.2)
Total Protein: 7.2 g/dL (ref 6.5–8.1)

## 2017-01-02 LAB — CBC
HEMATOCRIT: 38.5 % — AB (ref 39.0–52.0)
Hemoglobin: 13.9 g/dL (ref 13.0–17.0)
MCH: 32.9 pg (ref 26.0–34.0)
MCHC: 36.1 g/dL — ABNORMAL HIGH (ref 30.0–36.0)
MCV: 91 fL (ref 78.0–100.0)
PLATELETS: 154 10*3/uL (ref 150–400)
RBC: 4.23 MIL/uL (ref 4.22–5.81)
RDW: 12.7 % (ref 11.5–15.5)
WBC: 6.2 10*3/uL (ref 4.0–10.5)

## 2017-01-02 LAB — ETHANOL: Alcohol, Ethyl (B): 352 mg/dL (ref <5)

## 2017-01-02 NOTE — ED Provider Notes (Signed)
[facility]-EMERGENCY DEPT Provider Note   CSN: [id number] Arrival date & time: 01/02/17  1700     History   Chief Complaint Chief Complaint  Patient presents with  . Alcohol Intoxication    HPI David Doyle is a 46 y.o. male.  The history is provided by the patient. The history is limited by the condition of the patient. No language interpreter was used.  Alcohol Intoxication  This is a new problem.  Abdominal Cramping   Pt brought in by EMS and police.  Pt was found in the parking lot at [facility] wet and unconscious. Pt has a known history of substance and alcohol abuse. Pt responds to stimuli by police officer only.  Fall back asleep.  Police reports pt has been reported as missing from [facility].  Pt has a history of PTSD, alcohol and substance abuse.  Level 5 caveat. Pt unable to give other history  Past Medical History:  Diagnosis Date  . Bipolar 1 disorder (HCC)   . Depression   . Diabetes mellitus without complication (HCC)   . GERD (gastroesophageal reflux disease)   . Hypertension   . Pancreatitis   . Portal vein thrombosis   . PTSD (post-traumatic stress disorder)   . Substance abuse   . Suicidal behavior     Patient Active Problem List   Diagnosis Date Noted  . Heroin dependence (HCC) 03/15/2016  . 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 Opioid type dependence, continuous (HCC) 01/03/2016  . Alcohol dependence (HCC) 01/03/2016  . PTSD (post-traumatic stress disorder) 01/03/2016  . Suicidal ideation 01/03/2016  . Severe recurrent major depression without psychotic features (HCC) 01/03/2016  . BACK PAIN 07/15/2011    Past Surgical History:  Procedure Laterality Date  . ADENOIDECTOMY    . TONSILLECTOMY         Home Medications    Prior to Admission medications   Medication Sig Start Date End Date Taking? Authorizing Provider  atorvastatin (LIPITOR) 40 MG tablet  Take 1 tablet (40 mg total) by mouth daily. For high cholesterol 03/18/16   Thermon Leyland, NP  insulin detemir (LEVEMIR) 100 UNIT/ML injection Inject 0.1 mLs (10 Units total) into the skin at bedtime. 03/18/16   Thermon Leyland, NP  losartan (COZAAR) 50 MG tablet Take 1 tablet (50 mg total) by mouth daily. 03/18/16   Thermon Leyland, NP  metFORMIN (GLUCOPHAGE) 500 MG tablet Take 1 tablet (500 mg total) by mouth 2 (two) times daily with a meal. For diabetes management 03/18/16   Thermon Leyland, NP  ondansetron (ZOFRAN) 4 MG tablet Take 1 tablet (4 mg total) by mouth every 6 (six) hours. 12/26/16   Bethel Born, PA-C  pantoprazole (PROTONIX) 40 MG tablet Take 1 tablet (40 mg total) by mouth 2 (two) times daily. For acid reflux 03/03/16   Sanjuana Kava, NP  prazosin (MINIPRESS) 2 MG capsule Take 2 mg by mouth at bedtime.    Historical Provider, MD  QUEtiapine (SEROQUEL) 100 MG tablet Take 1 tablet (100 mg total) by mouth at bedtime. For mood control 03/03/16   Sanjuana Kava, NP  sertraline (ZOLOFT) 50 MG tablet Take 3 tablets (150 mg total) by mouth daily. For depression 03/03/16   Sanjuana Kava, NP  topiramate (TOPAMAX) 50 MG tablet Take 1 tablet (50 mg total) by mouth 3 (three) times daily. For mood stabilization 03/03/16   Sanjuana Kava, NP  Family History No family history on file.  Social History Social History  Substance Use Topics  . Smoking status: Current Every Day Smoker    Packs/day: 2.00  . Smokeless tobacco: Never Used  . Alcohol use Yes     Comment: occasionally     Allergies   Patient has no known allergies.   Review of Systems Review of Systems  Unable to perform ROS: Psychiatric disorder     Physical Exam Updated Vital Signs There were no vitals taken for this visit.  Physical Exam  Constitutional: He is oriented to person, place, and time. He appears well-developed and well-nourished.  HENT:  Head: Normocephalic.  Eyes: EOM are normal.  Neck: Normal range of  motion.  Cardiovascular: Normal rate and regular rhythm.   Pulmonary/Chest: Effort normal.  Abdominal: He exhibits no distension.  Musculoskeletal: Normal range of motion.  Neurological: He is alert and oriented to person, place, and time.  Skin: Skin is warm.  Psychiatric: He has a normal mood and affect.  Nursing note and vitals reviewed.    ED Treatments / Results  Labs (all labs ordered are listed, but only abnormal results are displayed) Labs Reviewed  COMPREHENSIVE METABOLIC PANEL  ETHANOL  CBC  RAPID URINE DRUG SCREEN, HOSP PERFORMED    EKG  EKG Interpretation None       Radiology No results found.  Procedures Procedures (including critical care time)  Medications Ordered in ED Medications - No data to display   Initial Impression / Assessment and Plan / ED Course  I have reviewed the triage vital signs and the nursing notes.  Pertinent labs & imaging results that were available during my care of the patient were reviewed by me and considered in my medical decision making (see chart for details).  Clinical Course       Final Clinical Impressions(s) / ED Diagnoses   Final diagnoses:  Alcoholic intoxication without complication (HCC)   Pt awake, talking and eating.  Police have warrants for pt.  Pt clinically sober.  Pt released to police New Prescriptions New Prescriptions   No medications on file     Elson AreasLeslie K Sofia, Cordelia Poche-C 01/02/17 2127    Lyndal Pulleyaniel Knott, MD 01/03/17 [phone]

## 2017-01-02 NOTE — ED Triage Notes (Signed)
Pt reports Bi polar, PTSD and pancreatitis.  Wife reported Pt missing in October.  [facility] [address]  Pt was sleeping outside.  Pt was unable to walk, had obvious ETOH on board and was unable to contract for safety stating that he wished he would die.  Pt has some outstanding warrants and [employer] came with Pt into [facility].  Alert, oriented to self and situation.

## 2017-01-02 NOTE — ED Notes (Signed)
Patient given something to eat  

## 2017-01-02 NOTE — ED Notes (Signed)
Requested urine from patient. 

## 2017-01-02 NOTE — Discharge Instructions (Signed)
Return if any problems.

## 2017-01-02 NOTE — ED Notes (Signed)
Patient taken to the bathroom to urinate. Patient took clothes off and is washing up.

## 2017-03-09 ENCOUNTER — Encounter (HOSPITAL_COMMUNITY): Payer: Self-pay | Admitting: Emergency Medicine

## 2017-03-09 ENCOUNTER — Inpatient Hospital Stay (HOSPITAL_COMMUNITY)
Admission: EM | Admit: 2017-03-09 | Discharge: 2017-03-13 | DRG: 439 | Disposition: A | Discharge status: Home / Self Care | Payer: Medicaid Other | Attending: Internal Medicine | Admitting: Internal Medicine

## 2017-03-09 ENCOUNTER — Inpatient Hospital Stay (HOSPITAL_COMMUNITY): Payer: Medicaid Other

## 2017-03-09 ENCOUNTER — Emergency Department (HOSPITAL_COMMUNITY): Payer: Medicaid Other

## 2017-03-09 DIAGNOSIS — K219 Gastro-esophageal reflux disease without esophagitis: Secondary | ICD-10-CM

## 2017-03-09 DIAGNOSIS — K4090 Unilateral inguinal hernia, without obstruction or gangrene, not specified as recurrent: Secondary | ICD-10-CM | POA: Diagnosis not present

## 2017-03-09 DIAGNOSIS — F329 Major depressive disorder, single episode, unspecified: Secondary | ICD-10-CM | POA: Diagnosis not present

## 2017-03-09 DIAGNOSIS — F17210 Nicotine dependence, cigarettes, uncomplicated: Secondary | ICD-10-CM | POA: Diagnosis present

## 2017-03-09 DIAGNOSIS — K4020 Bilateral inguinal hernia, without obstruction or gangrene, not specified as recurrent: Secondary | ICD-10-CM | POA: Diagnosis not present

## 2017-03-09 DIAGNOSIS — E119 Type 2 diabetes mellitus without complications: Secondary | ICD-10-CM | POA: Diagnosis present

## 2017-03-09 DIAGNOSIS — K859 Acute pancreatitis without necrosis or infection, unspecified: Secondary | ICD-10-CM | POA: Diagnosis present

## 2017-03-09 DIAGNOSIS — F1010 Alcohol abuse, uncomplicated: Secondary | ICD-10-CM | POA: Diagnosis present

## 2017-03-09 DIAGNOSIS — E871 Hypo-osmolality and hyponatremia: Secondary | ICD-10-CM | POA: Diagnosis present

## 2017-03-09 DIAGNOSIS — K2970 Gastritis, unspecified, without bleeding: Secondary | ICD-10-CM | POA: Diagnosis present

## 2017-03-09 DIAGNOSIS — I10 Essential (primary) hypertension: Secondary | ICD-10-CM | POA: Diagnosis present

## 2017-03-09 DIAGNOSIS — N5089 Other specified disorders of the male genital organs: Secondary | ICD-10-CM | POA: Diagnosis not present

## 2017-03-09 DIAGNOSIS — Z590 Homelessness: Secondary | ICD-10-CM

## 2017-03-09 DIAGNOSIS — Z79899 Other long term (current) drug therapy: Secondary | ICD-10-CM

## 2017-03-09 DIAGNOSIS — N281 Cyst of kidney, acquired: Secondary | ICD-10-CM | POA: Diagnosis not present

## 2017-03-09 DIAGNOSIS — F319 Bipolar disorder, unspecified: Secondary | ICD-10-CM | POA: Diagnosis present

## 2017-03-09 DIAGNOSIS — K852 Alcohol induced acute pancreatitis without necrosis or infection: Secondary | ICD-10-CM | POA: Diagnosis not present

## 2017-03-09 DIAGNOSIS — K5900 Constipation, unspecified: Secondary | ICD-10-CM | POA: Diagnosis not present

## 2017-03-09 DIAGNOSIS — Z794 Long term (current) use of insulin: Secondary | ICD-10-CM | POA: Diagnosis not present

## 2017-03-09 DIAGNOSIS — F39 Unspecified mood [affective] disorder: Secondary | ICD-10-CM | POA: Diagnosis not present

## 2017-03-09 DIAGNOSIS — N50819 Testicular pain, unspecified: Secondary | ICD-10-CM

## 2017-03-09 DIAGNOSIS — R1013 Epigastric pain: Secondary | ICD-10-CM | POA: Diagnosis present

## 2017-03-09 DIAGNOSIS — F1099 Alcohol use, unspecified with unspecified alcohol-induced disorder: Secondary | ICD-10-CM | POA: Diagnosis not present

## 2017-03-09 DIAGNOSIS — K2900 Acute gastritis without bleeding: Secondary | ICD-10-CM | POA: Diagnosis not present

## 2017-03-09 DIAGNOSIS — F32A Depression, unspecified: Secondary | ICD-10-CM

## 2017-03-09 LAB — COMPREHENSIVE METABOLIC PANEL
ALT: 24 U/L (ref 17–63)
AST: 35 U/L (ref 15–41)
Albumin: 3.8 g/dL (ref 3.5–5.0)
Alkaline Phosphatase: 80 U/L (ref 38–126)
Anion gap: 10 (ref 5–15)
BUN: 14 mg/dL (ref 6–20)
CHLORIDE: 99 mmol/L — AB (ref 101–111)
CO2: 25 mmol/L (ref 22–32)
Calcium: 8.8 mg/dL — ABNORMAL LOW (ref 8.9–10.3)
Creatinine, Ser: 0.64 mg/dL (ref 0.61–1.24)
GFR calc non Af Amer: >60 mL/min (ref >60)
Glucose, Bld: 190 mg/dL — ABNORMAL HIGH (ref 65–99)
POTASSIUM: 3.5 mmol/L (ref 3.5–5.1)
SODIUM: 134 mmol/L — AB (ref 135–145)
Total Bilirubin: 0.8 mg/dL (ref 0.3–1.2)
Total Protein: 7 g/dL (ref 6.5–8.1)

## 2017-03-09 LAB — CBC
HEMATOCRIT: 36.3 % — AB (ref 39.0–52.0)
Hemoglobin: 13 g/dL (ref 13.0–17.0)
MCH: 31.3 pg (ref 26.0–34.0)
MCHC: 35.8 g/dL (ref 30.0–36.0)
MCV: 87.3 fL (ref 78.0–100.0)
Platelets: 192 10*3/uL (ref 150–400)
RBC: 4.16 MIL/uL — ABNORMAL LOW (ref 4.22–5.81)
RDW: 13.7 % (ref 11.5–15.5)
WBC: 5.2 10*3/uL (ref 4.0–10.5)

## 2017-03-09 LAB — URINALYSIS, ROUTINE W REFLEX MICROSCOPIC
BACTERIA UA: NONE SEEN
Bilirubin Urine: NEGATIVE
GLUCOSE, UA: 150 mg/dL — AB
HGB URINE DIPSTICK: NEGATIVE
Ketones, ur: 5 mg/dL — AB
LEUKOCYTES UA: NEGATIVE
Nitrite: NEGATIVE
PH: 7 (ref 5.0–8.0)
PROTEIN: 30 mg/dL — AB
RBC / HPF: NONE SEEN RBC/hpf (ref 0–5)
Specific Gravity, Urine: 1.023 (ref 1.005–1.030)

## 2017-03-09 LAB — LIPASE, BLOOD: LIPASE: 34 U/L (ref 11–51)

## 2017-03-09 LAB — GLUCOSE, CAPILLARY: GLUCOSE-CAPILLARY: 146 mg/dL — AB (ref 65–99)

## 2017-03-09 MED ORDER — FOLIC ACID 1 MG PO TABS
1.0000 mg | ORAL_TABLET | Freq: Every day | ORAL | Status: DC
  Administered 2017-03-10 – 2017-03-13 (×4): 1 mg via ORAL
  Filled 2017-03-09 (×4): qty 1

## 2017-03-09 MED ORDER — HYDROMORPHONE HCL 1 MG/ML IJ SOLN
1.0000 mg | INTRAMUSCULAR | Status: AC | PRN
  Administered 2017-03-09 (×2): 1 mg via INTRAVENOUS
  Filled 2017-03-09 (×2): qty 1

## 2017-03-09 MED ORDER — ONDANSETRON HCL 4 MG/2ML IJ SOLN
4.0000 mg | Freq: Four times a day (QID) | INTRAMUSCULAR | Status: DC | PRN
  Administered 2017-03-09 – 2017-03-12 (×4): 4 mg via INTRAVENOUS
  Filled 2017-03-09 (×5): qty 2

## 2017-03-09 MED ORDER — OXYCODONE-ACETAMINOPHEN 5-325 MG PO TABS
1.0000 | ORAL_TABLET | ORAL | Status: DC | PRN
  Administered 2017-03-10 – 2017-03-13 (×15): 2 via ORAL
  Filled 2017-03-09 (×15): qty 2

## 2017-03-09 MED ORDER — LORAZEPAM 1 MG PO TABS
1.0000 mg | ORAL_TABLET | Freq: Four times a day (QID) | ORAL | Status: AC | PRN
  Administered 2017-03-11: 1 mg via ORAL
  Filled 2017-03-09: qty 1

## 2017-03-09 MED ORDER — THIAMINE HCL 100 MG/ML IJ SOLN
100.0000 mg | Freq: Every day | INTRAMUSCULAR | Status: DC
  Filled 2017-03-09: qty 2

## 2017-03-09 MED ORDER — PANTOPRAZOLE SODIUM 40 MG IV SOLR
40.0000 mg | Freq: Once | INTRAVENOUS | Status: AC
  Administered 2017-03-09: 40 mg via INTRAVENOUS
  Filled 2017-03-09: qty 40

## 2017-03-09 MED ORDER — ENOXAPARIN SODIUM 40 MG/0.4ML ~~LOC~~ SOLN
40.0000 mg | SUBCUTANEOUS | Status: DC
  Administered 2017-03-09 – 2017-03-12 (×3): 40 mg via SUBCUTANEOUS
  Filled 2017-03-09 (×3): qty 0.4

## 2017-03-09 MED ORDER — GADOBENATE DIMEGLUMINE 529 MG/ML IV SOLN
20.0000 mL | Freq: Once | INTRAVENOUS | Status: AC | PRN
  Administered 2017-03-09: 18 mL via INTRAVENOUS

## 2017-03-09 MED ORDER — IOPAMIDOL (ISOVUE-300) INJECTION 61%
INTRAVENOUS | Status: AC
  Filled 2017-03-09: qty 100

## 2017-03-09 MED ORDER — VITAMIN B-1 100 MG PO TABS
100.0000 mg | ORAL_TABLET | Freq: Every day | ORAL | Status: DC
  Administered 2017-03-10 – 2017-03-13 (×4): 100 mg via ORAL
  Filled 2017-03-09 (×4): qty 1

## 2017-03-09 MED ORDER — SODIUM CHLORIDE 0.9 % IV SOLN
Freq: Once | INTRAVENOUS | Status: AC
  Administered 2017-03-09: 13:00:00 via INTRAVENOUS

## 2017-03-09 MED ORDER — ONDANSETRON HCL 4 MG/2ML IJ SOLN
4.0000 mg | Freq: Once | INTRAMUSCULAR | Status: AC
Start: 2017-03-09 — End: 2017-03-09
  Administered 2017-03-09: 4 mg via INTRAVENOUS
  Filled 2017-03-09: qty 2

## 2017-03-09 MED ORDER — ADULT MULTIVITAMIN W/MINERALS CH
1.0000 | ORAL_TABLET | Freq: Every day | ORAL | Status: DC
  Administered 2017-03-10 – 2017-03-13 (×4): 1 via ORAL
  Filled 2017-03-09 (×4): qty 1

## 2017-03-09 MED ORDER — IOPAMIDOL (ISOVUE-300) INJECTION 61%
100.0000 mL | Freq: Once | INTRAVENOUS | Status: AC | PRN
  Administered 2017-03-09: 100 mL via INTRAVENOUS

## 2017-03-09 MED ORDER — SODIUM CHLORIDE 0.9 % IV BOLUS (SEPSIS)
1000.0000 mL | Freq: Once | INTRAVENOUS | Status: AC
  Administered 2017-03-09: 1000 mL via INTRAVENOUS

## 2017-03-09 MED ORDER — INSULIN ASPART 100 UNIT/ML ~~LOC~~ SOLN: 0.0000 [IU] | Freq: Every day | SUBCUTANEOUS | Status: DC

## 2017-03-09 MED ORDER — SODIUM CHLORIDE 0.9 % IV SOLN
INTRAVENOUS | Status: DC
  Administered 2017-03-09 – 2017-03-12 (×7): via INTRAVENOUS

## 2017-03-09 MED ORDER — ONDANSETRON 4 MG PO TBDP
4.0000 mg | ORAL_TABLET | Freq: Once | ORAL | Status: AC | PRN
  Administered 2017-03-09: 4 mg via ORAL
  Filled 2017-03-09: qty 1

## 2017-03-09 MED ORDER — LORAZEPAM 2 MG/ML IJ SOLN
1.0000 mg | Freq: Four times a day (QID) | INTRAMUSCULAR | Status: AC | PRN
  Administered 2017-03-10 – 2017-03-12 (×5): 1 mg via INTRAVENOUS
  Filled 2017-03-09 (×5): qty 1

## 2017-03-09 MED ORDER — KETOROLAC TROMETHAMINE 15 MG/ML IJ SOLN
15.0000 mg | Freq: Three times a day (TID) | INTRAMUSCULAR | Status: AC | PRN
  Administered 2017-03-09: 15 mg via INTRAVENOUS
  Filled 2017-03-09 (×3): qty 1

## 2017-03-09 MED ORDER — INSULIN ASPART 100 UNIT/ML ~~LOC~~ SOLN
0.0000 [IU] | Freq: Three times a day (TID) | SUBCUTANEOUS | Status: DC
  Administered 2017-03-10: 2 [IU] via SUBCUTANEOUS
  Administered 2017-03-10: 3 [IU] via SUBCUTANEOUS
  Administered 2017-03-10: 1 [IU] via SUBCUTANEOUS
  Administered 2017-03-11: 2 [IU] via SUBCUTANEOUS
  Administered 2017-03-11: 3 [IU] via SUBCUTANEOUS
  Administered 2017-03-11: 2 [IU] via SUBCUTANEOUS
  Administered 2017-03-12 (×3): 1 [IU] via SUBCUTANEOUS
  Administered 2017-03-13 (×2): 2 [IU] via SUBCUTANEOUS

## 2017-03-09 MED ORDER — PROMETHAZINE HCL 25 MG/ML IJ SOLN
12.5000 mg | Freq: Four times a day (QID) | INTRAMUSCULAR | Status: DC | PRN
  Administered 2017-03-09: 12.5 mg via INTRAVENOUS
  Filled 2017-03-09 (×3): qty 1

## 2017-03-09 NOTE — ED Notes (Signed)
Patient transported to CT 

## 2017-03-09 NOTE — ED Provider Notes (Signed)
[facility]-EMERGENCY DEPT Provider Note   CSN: [id number] Arrival date & time: 03/09/17  1100     History   Chief Complaint Chief Complaint  Patient presents with  . Pancreatitis    HPI David Doyle is a 46 y.o. male.Chief complaint is abdominal pain and vomiting.  HPI:  46 year old male. History of alcoholism. History of being homeless. History of previous pancreatitis.  Presents today complaining of abdominal pain with vomiting for about the last 2 days. States he drinks every day. Usually beer. Has had pancreatitis in the past. His always been hospitalized. His never been treated as an outpatient.  Should hypertension diabetes. States is "supposed to be on insulin and other medications". Has not been on them for "at least a week".  Past Medical History:  Diagnosis Date  . Bipolar 1 disorder (HCC)   . Depression   . Diabetes mellitus without complication (HCC)   . GERD (gastroesophageal reflux disease)   . Hypertension   . Pancreatitis   . Portal vein thrombosis   . PTSD (post-traumatic stress disorder)   . Substance abuse   . Suicidal behavior     Patient Active Problem List   Diagnosis Date Noted  . Heroin dependence (HCC) 03/15/2016  . 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 Opioid type dependence, continuous (HCC) 01/03/2016  . Alcohol dependence (HCC) 01/03/2016  . PTSD (post-traumatic stress disorder) 01/03/2016  . Suicidal ideation 01/03/2016  . Severe recurrent major depression without psychotic features (HCC) 01/03/2016  . BACK PAIN 07/15/2011    Past Surgical History:  Procedure Laterality Date  . ADENOIDECTOMY    . TONSILLECTOMY         Home Medications    Prior to Admission medications   Medication Sig Start Date End Date Taking? Authorizing Provider  atorvastatin (LIPITOR) 40 MG tablet Take 1 tablet (40 mg total) by mouth daily. For high cholesterol Patient not  taking: Reported on 03/09/2017 03/18/16   Thermon LeylandLaura A Davis, NP  insulin detemir (LEVEMIR) 100 UNIT/ML injection Inject 0.1 mLs (10 Units total) into the skin at bedtime. Patient not taking: Reported on 03/09/2017 03/18/16   Thermon LeylandLaura A Davis, NP  losartan (COZAAR) 50 MG tablet Take 1 tablet (50 mg total) by mouth daily. Patient not taking: Reported on 03/09/2017 03/18/16   Thermon LeylandLaura A Davis, NP  metFORMIN (GLUCOPHAGE) 500 MG tablet Take 1 tablet (500 mg total) by mouth 2 (two) times daily with a meal. For diabetes management Patient not taking: Reported on 03/09/2017 03/18/16   Thermon LeylandLaura A Davis, NP  ondansetron (ZOFRAN) 4 MG tablet Take 1 tablet (4 mg total) by mouth every 6 (six) hours. Patient not taking: Reported on 03/09/2017 12/26/16   Bethel BornKelly Marie Gekas, PA-C  pantoprazole (PROTONIX) 40 MG tablet Take 1 tablet (40 mg total) by mouth 2 (two) times daily. For acid reflux Patient not taking: Reported on 03/09/2017 03/03/16   Sanjuana KavaAgnes I Nwoko, NP  prazosin (MINIPRESS) 2 MG capsule Take 2 mg by mouth at bedtime.    Historical Provider, MD  QUEtiapine (SEROQUEL) 100 MG tablet Take 1 tablet (100 mg total) by mouth at bedtime. For mood control Patient not taking: Reported on 03/09/2017 03/03/16   Sanjuana KavaAgnes I Nwoko, NP  sertraline (ZOLOFT) 50 MG tablet Take 3 tablets (150 mg total) by mouth daily. For depression Patient not taking: Reported on 03/09/2017 03/03/16   Sanjuana KavaAgnes I Nwoko, NP  topiramate (TOPAMAX) 50 MG tablet Take 1  tablet (50 mg total) by mouth 3 (three) times daily. For mood stabilization Patient not taking: Reported on 03/09/2017 03/03/16   Sanjuana Kava, NP    Family History History reviewed. No pertinent family history.  Social History Social History  Substance Use Topics  . Smoking status: Current Every Day Smoker    Packs/day: 2.00  . Smokeless tobacco: Never Used  . Alcohol use Yes     Comment: occasionally     Allergies   Patient has no known allergies.   Review of Systems Review of Systems    Constitutional: Negative for appetite change, chills, diaphoresis, fatigue and fever.  HENT: Negative for mouth sores, sore throat and trouble swallowing.   Eyes: Negative for visual disturbance.  Respiratory: Negative for cough, chest tightness, shortness of breath and wheezing.   Cardiovascular: Negative for chest pain.  Gastrointestinal: Positive for abdominal distention, nausea and vomiting. Negative for abdominal pain and diarrhea.  Endocrine: Negative for polydipsia, polyphagia and polyuria.  Genitourinary: Negative for dysuria, frequency and hematuria.  Musculoskeletal: Negative for gait problem.  Skin: Negative for color change, pallor and rash.  Neurological: Negative for dizziness, syncope, light-headedness and headaches.  Hematological: Does not bruise/bleed easily.  Psychiatric/Behavioral: Negative for behavioral problems and confusion.     Physical Exam Updated Vital Signs BP 135/89 (BP Location: Right Arm)   Pulse 85   Temp 98 F (36.7 C) (Oral)   Resp 16   Ht 5\' 10"  (1.778 m)   Wt 190 lb (86.2 kg)   SpO2 98%   BMI 27.26 kg/m   Physical Exam  Constitutional: He is oriented to person, place, and time. He appears well-developed and well-nourished. No distress.  HENT:  Head: Normocephalic.  Eyes: Conjunctivae are normal. Pupils are equal, round, and reactive to light. No scleral icterus.  Neck: Normal range of motion. Neck supple. No thyromegaly present.  Cardiovascular: Normal rate and regular rhythm.  Exam reveals no gallop and no friction rub.   No murmur heard. Pulmonary/Chest: Effort normal and breath sounds normal. No respiratory distress. He has no wheezes. He has no rales.  Abdominal: Soft. Bowel sounds are normal. He exhibits no distension. There is tenderness. There is no rebound.  Epigastric pain. Patient indicates the pain radiates to both left and right upper abdomen subcostal and it was back. No peritoneal irritation.  Musculoskeletal: Normal range  of motion.  Neurological: He is alert and oriented to person, place, and time.  Skin: Skin is warm and dry. No rash noted.  Psychiatric: He has a normal mood and affect. His behavior is normal.     ED Treatments / Results  Labs (all labs ordered are listed, but only abnormal results are displayed) Labs Reviewed  COMPREHENSIVE METABOLIC PANEL - Abnormal; Notable for the following:       Result Value   Sodium 134 (*)    Chloride 99 (*)    Glucose, Bld 190 (*)    Calcium 8.8 (*)    All other components within normal limits  CBC - Abnormal; Notable for the following:    RBC 4.16 (*)    HCT 36.3 (*)    All other components within normal limits  URINALYSIS, ROUTINE W REFLEX MICROSCOPIC - Abnormal; Notable for the following:    Glucose, UA 150 (*)    Ketones, ur 5 (*)    Protein, ur 30 (*)    Squamous Epithelial / LPF 0-5 (*)    All other components within normal limits  LIPASE,  BLOOD    EKG  EKG Interpretation None       Radiology Ct Abdomen Pelvis W Contrast  Result Date: 03/09/2017 CLINICAL DATA:  Evaluate for pancreatitis. EXAM: CT ABDOMEN AND PELVIS WITH CONTRAST TECHNIQUE: Multidetector CT imaging of the abdomen and pelvis was performed using the standard protocol following bolus administration of intravenous contrast. CONTRAST:  ISOVUE-300 IOPAMIDOL (ISOVUE-300) INJECTION 61% COMPARISON:  10/01/2015 and 05/07/2014 FINDINGS: Lower chest: No acute abnormality. Hepatobiliary: No focal liver abnormality is seen. No gallstones, gallbladder wall thickening, or biliary dilatation. Pancreas: There is no mass or fluid collections associated with the pancreas. Mild edema and peripancreatic fat stranding involves the body and proximal tail of pancreas, image number 20 of series 2. Findings are compatible with mild uncomplicated pancreatitis. Spleen: Normal in size without focal abnormality. Adrenals/Urinary Tract: The left kidney appears normal. There is a indeterminate right  kidney lesion which measures 1.2 cm and 50 HU postcontrast, image 24 of series 7. No hydronephrosis or hydroureter. The urinary bladder appears normal. Stomach/Bowel: Stomach is within normal limits. Appendix appears normal. No evidence of bowel wall thickening, distention, or inflammatory changes. Vascular/Lymphatic: No significant vascular findings are present. No enlarged abdominal or pelvic lymph nodes. Reproductive: Prostate is unremarkable. Other: No abdominal wall hernia or abnormality. No abdominopelvic ascites. Musculoskeletal: No acute or significant osseous findings. IMPRESSION: 1. Mild inflammatory changes involving the body and proximal tail of pancreas compatible with uncomplicated pancreatitis. 2. Indeterminate right kidney lesion. This does not seem significantly changed from 05/07/2014. Nevertheless, findings may represent a hyperdense cyst or solid enhancing renal neoplasm. Consider more definitive assessment of this structure with nonemergent MRI of the kidneys. Electronically Signed   By: Signa Kell M.D.   On: 03/09/2017 13:48    Procedures Procedures (including critical care time)  Medications Ordered in ED Medications  iopamidol (ISOVUE-300) 61 % injection (not administered)  ondansetron (ZOFRAN-ODT) disintegrating tablet 4 mg (4 mg Oral Given 03/09/17 1143)  sodium chloride 0.9 % bolus 1,000 mL (1,000 mLs Intravenous New Bag/Given 03/09/17 1240)  0.9 %  sodium chloride infusion ( Intravenous New Bag/Given 03/09/17 1241)  ondansetron (ZOFRAN) injection 4 mg (4 mg Intravenous Given 03/09/17 1241)  HYDROmorphone (DILAUDID) injection 1 mg (1 mg Intravenous Given 03/09/17 1434)  pantoprazole (PROTONIX) injection 40 mg (40 mg Intravenous Given 03/09/17 1241)  iopamidol (ISOVUE-300) 61 % injection 100 mL (100 mLs Intravenous Contrast Given 03/09/17 1317)     Initial Impression / Assessment and Plan / ED Course  I have reviewed the triage vital signs and the nursing  notes.  Pertinent labs & imaging results that were available during my care of the patient were reviewed by me and considered in my medical decision making (see chart for details).    Patient with CT findings suggestive of uncomplicated pancreatitis. His enzymes and labs are reassuring. However he still nauseated and painful. Care discussed with Dr. Domenica Fail. Patient will be admitted for symptom control.   Final Clinical Impressions(s) / ED Diagnoses   Final diagnoses:  Alcohol-induced acute pancreatitis, unspecified complication status    New Prescriptions New Prescriptions   No medications on file     Rolland Porter, MD 03/09/17 1438

## 2017-03-09 NOTE — ED Notes (Signed)
Bed: WA07 Expected date:  Expected time:  Means of arrival:  Comments: EMS 46 yo, pancreatitis

## 2017-03-09 NOTE — ED Notes (Signed)
Patient transported to MRI 

## 2017-03-09 NOTE — ED Notes (Signed)
ADMISSION RN PRESENT

## 2017-03-09 NOTE — H&P (Signed)
History and Physical    David Doyle ZOX:[id number] DOB: 1971/02/14 DOA: 03/09/2017  PCP: [facility]  Patient coming from: Home.  I have personally briefly reviewed patient's old medical records in [facility]  Chief Complaint: abdominal pain radiating to the back since 2 days.   HPI: David Doyle is a 46 y.o. male with medical history significant of alcohol abuse, homeless, hypertension and DM presents with nausea, vomiting, abdominal pain and diarrhea for 2 days. Abdominal pain left upper quadrant and epigastric area, radiating to the back, . On arrival to ED,. His labs were remarkable for mild hyponatremia. CT abd shows non complicated pancreatitis. He was referred to medical service for admission.     Review of Systems: As per HPI otherwise 10 point review of systems negative.    Past Medical History:  Diagnosis Date  . Bipolar 1 disorder (HCC)   . Depression   . Diabetes mellitus without complication (HCC)   . GERD (gastroesophageal reflux disease)   . Hypertension   . Pancreatitis   . Portal vein thrombosis   . PTSD (post-traumatic stress disorder)   . Substance abuse   . Suicidal behavior     Past Surgical History:  Procedure Laterality Date  . ADENOIDECTOMY    . TONSILLECTOMY       reports that he has been smoking.  He has been smoking about 2.00 packs per day. He has never used smokeless tobacco. He reports that he drinks alcohol. He reports that he uses drugs, including Cocaine and IV.  No Known Allergies  History reviewed. No pertinent family history.   Prior to Admission medications   Medication Sig Start Date End Date Taking? Authorizing Provider  atorvastatin (LIPITOR) 40 MG tablet Take 1 tablet (40 mg total) by mouth daily. For high cholesterol Patient not taking: Reported on 03/09/2017 03/18/16   Thermon Leyland, NP  insulin detemir (LEVEMIR) 100 UNIT/ML injection Inject 0.1 mLs (10 Units total) into the skin at  bedtime. Patient not taking: Reported on 03/09/2017 03/18/16   Thermon Leyland, NP  losartan (COZAAR) 50 MG tablet Take 1 tablet (50 mg total) by mouth daily. Patient not taking: Reported on 03/09/2017 03/18/16   Thermon Leyland, NP  metFORMIN (GLUCOPHAGE) 500 MG tablet Take 1 tablet (500 mg total) by mouth 2 (two) times daily with a meal. For diabetes management Patient not taking: Reported on 03/09/2017 03/18/16   Thermon Leyland, NP  ondansetron (ZOFRAN) 4 MG tablet Take 1 tablet (4 mg total) by mouth every 6 (six) hours. Patient not taking: Reported on 03/09/2017 12/26/16   Bethel Born, PA-C  pantoprazole (PROTONIX) 40 MG tablet Take 1 tablet (40 mg total) by mouth 2 (two) times daily. For acid reflux Patient not taking: Reported on 03/09/2017 03/03/16   Sanjuana Kava, NP  prazosin (MINIPRESS) 2 MG capsule Take 2 mg by mouth at bedtime.    Historical Provider, MD  QUEtiapine (SEROQUEL) 100 MG tablet Take 1 tablet (100 mg total) by mouth at bedtime. For mood control Patient not taking: Reported on 03/09/2017 03/03/16   Sanjuana Kava, NP  sertraline (ZOLOFT) 50 MG tablet Take 3 tablets (150 mg total) by mouth daily. For depression Patient not taking: Reported on 03/09/2017 03/03/16   Sanjuana Kava, NP  topiramate (TOPAMAX) 50 MG tablet Take 1 tablet (50 mg total) by mouth 3 (three) times daily. For mood stabilization Patient not taking: Reported on 03/09/2017  03/03/16   Sanjuana Kava, NP    Physical Exam: Vitals:   03/09/17 1134 03/09/17 1136 03/09/17 1244 03/09/17 1600  BP:  131/84 135/89 134/87  Pulse: 94 83 85 83  Resp:   16 16  Temp:  98 F (36.7 C)    TempSrc:  Oral    SpO2:  100% 98% 97%  Weight:      Height:        Constitutional: NAD, calm, comfortable Vitals:   03/09/17 1134 03/09/17 1136 03/09/17 1244 03/09/17 1600  BP:  131/84 135/89 134/87  Pulse: 94 83 85 83  Resp:   16 16  Temp:  98 F (36.7 C)    TempSrc:  Oral    SpO2:  100% 98% 97%  Weight:      Height:       Eyes:  PERRL, lids and conjunctivae normal ENMT: Mucous membranes are moist. Posterior pharynx clear of any exudate or lesions.Normal dentition.  Neck: normal, supple, no masses, no thyromegaly Respiratory: clear to auscultation bilaterally, no wheezing, no crackles. Normal respiratory effort. No accessory muscle use.  Cardiovascular: Regular rate and rhythm, no murmurs / rubs / gallops. No extremity edema. 2+ pedal pulses. No carotid bruits.  Abdomen: moderate abd tenderness in the left upper quadrant and epigastric area. No hepatosplenomegaly. Bowel sounds positive.  Musculoskeletal: no clubbing / cyanosis. No joint deformity upper and lower extremities. Good ROM, no contractures. Normal muscle tone.  Skin: no rashes, lesions, ulcers. No induration Neurologic: CN 2-12 grossly intact. Sensation intact, DTR normal. Strength 5/5 in all 4.  Psychiatric: Normal judgment and insight. Alert and oriented x 3. Normal mood.    Labs on Admission: I have personally reviewed following labs and imaging studies  CBC:  Recent Labs Lab 03/09/17 1130  WBC 5.2  HGB 13.0  HCT 36.3*  MCV 87.3  PLT 192   Basic Metabolic Panel:  Recent Labs Lab 03/09/17 1130  NA 134*  K 3.5  CL 99*  CO2 25  GLUCOSE 190*  BUN 14  CREATININE 0.64  CALCIUM 8.8*   GFR: Estimated Creatinine Clearance: 120.4 mL/min (by C-G formula based on SCr of 0.64 mg/dL). Liver Function Tests:  Recent Labs Lab 03/09/17 1130  AST 35  ALT 24  ALKPHOS 80  BILITOT 0.8  PROT 7.0  ALBUMIN 3.8    Recent Labs Lab 03/09/17 1130  LIPASE 34   No results for input(s): AMMONIA in the last 168 hours. Coagulation Profile: No results for input(s): INR, PROTIME in the last 168 hours. Cardiac Enzymes: No results for input(s): CKTOTAL, CKMB, CKMBINDEX, TROPONINI in the last 168 hours. BNP (last 3 results) No results for input(s): PROBNP in the last 8760 hours. HbA1C: No results for input(s): HGBA1C in the last 72 hours. CBG: No  results for input(s): GLUCAP in the last 168 hours. Lipid Profile: No results for input(s): CHOL, HDL, LDLCALC, TRIG, CHOLHDL, LDLDIRECT in the last 72 hours. Thyroid Function Tests: No results for input(s): TSH, T4TOTAL, FREET4, T3FREE, THYROIDAB in the last 72 hours. Anemia Panel: No results for input(s): VITAMINB12, FOLATE, FERRITIN, TIBC, IRON, RETICCTPCT in the last 72 hours. Urine analysis:    Component Value Date/Time   COLORURINE YELLOW 03/09/2017 1139   APPEARANCEUR CLEAR 03/09/2017 1139   LABSPEC 1.023 03/09/2017 1139   PHURINE 7.0 03/09/2017 1139   GLUCOSEU 150 (A) 03/09/2017 1139   HGBUR NEGATIVE 03/09/2017 1139   BILIRUBINUR NEGATIVE 03/09/2017 1139   KETONESUR 5 (A) 03/09/2017 1139  PROTEINUR 30 (A) 03/09/2017 1139   UROBILINOGEN 1.0 06/23/2011 1143   NITRITE NEGATIVE 03/09/2017 1139   LEUKOCYTESUR NEGATIVE 03/09/2017 1139    Radiological Exams on Admission: Ct Abdomen Pelvis W Contrast  Result Date: 03/09/2017 CLINICAL DATA:  Evaluate for pancreatitis. EXAM: CT ABDOMEN AND PELVIS WITH CONTRAST TECHNIQUE: Multidetector CT imaging of the abdomen and pelvis was performed using the standard protocol following bolus administration of intravenous contrast. CONTRAST:  100mL ISOVUE-300 IOPAMIDOL (ISOVUE-300) INJECTION 61% COMPARISON:  10/01/2015 and 05/07/2014 FINDINGS: Lower chest: No acute abnormality. Hepatobiliary: No focal liver abnormality is seen. No gallstones, gallbladder wall thickening, or biliary dilatation. Pancreas: There is no mass or fluid collections associated with the pancreas. Mild edema and peripancreatic fat stranding involves the body and proximal tail of pancreas, image number 20 of series 2. Findings are compatible with mild uncomplicated pancreatitis. Spleen: Normal in size without focal abnormality. Adrenals/Urinary Tract: The left kidney appears normal. There is a indeterminate right kidney lesion which measures 1.2 cm and 50 HU postcontrast, image 24 of  series 7. No hydronephrosis or hydroureter. The urinary bladder appears normal. Stomach/Bowel: Stomach is within normal limits. Appendix appears normal. No evidence of bowel wall thickening, distention, or inflammatory changes. Vascular/Lymphatic: No significant vascular findings are present. No enlarged abdominal or pelvic lymph nodes. Reproductive: Prostate is unremarkable. Other: No abdominal wall hernia or abnormality. No abdominopelvic ascites. Musculoskeletal: No acute or significant osseous findings. IMPRESSION: 1. Mild inflammatory changes involving the body and proximal tail of pancreas compatible with uncomplicated pancreatitis. 2. Indeterminate right kidney lesion. This does not seem significantly changed from 05/07/2014. Nevertheless, findings may represent a hyperdense cyst or solid enhancing renal neoplasm. Consider more definitive assessment of this structure with nonemergent MRI of the kidneys. Electronically Signed   By: Signa Kellaylor  Stroud M.D.   On: 03/09/2017 13:48      Assessment/Plan Active Problems:   Pancreatitis  acute pancreatitis:  Admit for IV fluids, IV pain control and anti emetics.  NPO except for ice chips.  Monitor wbc count.    Hypertension:  Well controlled.   Diabetes mellitus:  Get hgba1c  And start him on SSI.  He will need prescriptions on discharge.    DVT prophylaxis: lovenox. Code Status: full code.  Family Communication: none at bedside.  Disposition Plan: pending further eval. Consults called:none.  Admission status: inpatient/ med surg.    Kathlen ModyAKULA,Ranelle Auker MD [facility] Pager [phone]  If 7PM-7AM, please contact night-coverage [email or web address] Password [email or web address]  03/09/2017, 6:09 PM

## 2017-03-09 NOTE — ED Triage Notes (Signed)
Pt has Hx of ETOH pancreatitis with last flare up a few months ago.  Pt is homeless per EMS.

## 2017-03-10 LAB — GLUCOSE, CAPILLARY
GLUCOSE-CAPILLARY: 168 mg/dL — AB (ref 65–99)
Glucose-Capillary: 146 mg/dL — ABNORMAL HIGH (ref 65–99)
Glucose-Capillary: 179 mg/dL — ABNORMAL HIGH (ref 65–99)
Glucose-Capillary: 233 mg/dL — ABNORMAL HIGH (ref 65–99)

## 2017-03-10 MED ORDER — HYDRALAZINE HCL 20 MG/ML IJ SOLN
10.0000 mg | Freq: Four times a day (QID) | INTRAMUSCULAR | Status: DC | PRN
  Administered 2017-03-10: 10 mg via INTRAVENOUS
  Filled 2017-03-10: qty 1

## 2017-03-10 MED ORDER — OXYCODONE HCL 5 MG PO TABS: 5.0000 mg | ORAL_TABLET | ORAL | Status: DC | PRN

## 2017-03-10 MED ORDER — PANTOPRAZOLE SODIUM 40 MG IV SOLR
40.0000 mg | Freq: Two times a day (BID) | INTRAVENOUS | Status: DC
  Administered 2017-03-10 – 2017-03-11 (×2): 40 mg via INTRAVENOUS
  Filled 2017-03-10 (×2): qty 40

## 2017-03-10 MED ORDER — HYDROMORPHONE HCL 1 MG/ML IJ SOLN
1.0000 mg | INTRAMUSCULAR | Status: DC | PRN
  Administered 2017-03-10 – 2017-03-13 (×23): 1 mg via INTRAVENOUS
  Filled 2017-03-10 (×23): qty 1

## 2017-03-10 NOTE — Progress Notes (Signed)
Pt had a clear liquid tray and began throwing up about 30 minutes after eating. Complained of pain in the epigastric and upper back.

## 2017-03-10 NOTE — Progress Notes (Signed)
Spoke with patient at bedside. He is requesting assistance with his disability, states his money has been frozen until he gets a beneficiary to assist him. Contacted CSW to work with patient on calling Disability office, also will talk with patient re: substance abuse, transportation, and homelessness. Patient states he has a spouse and a brother but they are unable to assist. Patient is Medicaid covered so unable to provide medication assistance. Provided patient with list for obtaining PCP, he has been to urgent cares in the past as well [facility]

## 2017-03-10 NOTE — Progress Notes (Signed)
CSW met with pt at bedside to provide resources: transportation, shelter, [facility], [place], #'s for DSS and [employer], transportation. Pt will review info and have his nurse contact CSW with any questions.  Werner Lean LCSW [phone]

## 2017-03-10 NOTE — Progress Notes (Signed)
Notified MD of BP of 165/102 via text page. Possibly related to his pain. Waiting to hear back.

## 2017-03-10 NOTE — Progress Notes (Signed)
PROGRESS NOTE    David Doyle  WUJ:[id number] DOB: 01-Dec-1971 DOA: 03/09/2017 PCP: [facility]   Brief Narrative: David Doyle is a 46 y.o. male with medical history significant of alcohol abuse, homeless, hypertension and DM presents with nausea, vomiting, abdominal pain and diarrhea for 2 days. Abdominal pain left upper quadrant and epigastric area, radiating to the back, . On arrival to ED,. His labs were remarkable for mild hyponatremia. CT abd shows non complicated pancreatitis. He was referred to medical service for admission.     Assessment & Plan:   Active Problems:   Pancreatitis   1-Abdominal pain; could be related to mild pancreatitis, although lipase normal, CT abdomen did showed mild inflammation in the pancreas. MRI was negative but was limited. Vs Gastritis from alcohol.  Start IV Protonix.  IV fluids.  IV dilaudid.  Clear diet.   2-Alcohol use.  Monitor for withdrawal.   3-HTN; PRN hydralazine.   4-DM;  Follow HBA1c.  SSI.  Hold metformin.   Mood disorder;  Resume meds when tolerating oral.   DVT prophylaxis: lovenox.  Code Status: Full code.  Family Communication: care discussed with patient.  Disposition Plan: home at time of discharge.   Consultants:      Procedures: none   Antimicrobials: none   Subjective: Still with severe pain 7/10. Epigastric area.    Objective: Vitals:   03/09/17 2116 03/09/17 2138 03/09/17 2354 03/10/17 0538  BP: (!) 153/76 (!) 143/87 137/77 (!) 150/88  Pulse: 83 87 84 72  Resp:  16 16 20   Temp:  97.9 F (36.6 C) 98 F (36.7 C) 97.9 F (36.6 C)  TempSrc:  Oral Oral Oral  SpO2:  98% 98% 98%  Weight:  88.1 kg (194 lb 3.6 oz)    Height:  5\' 10"  (1.778 m)      Intake/Output Summary (Last 24 hours) at 03/10/17 1113 Last data filed at 03/10/17 1000  Gross per 24 hour  Intake             2200 ml  Output               50 ml  Net             2150 ml   Filed Weights   03/09/17 1130 03/09/17 2138  Weight: 86.2 kg (190 lb) 88.1 kg (194 lb 3.6 oz)    Examination:  General exam: Appears calm and comfortable  Respiratory system: Clear to auscultation. Respiratory effort normal. Cardiovascular system: S1 & S2 heard, RRR. No JVD, murmurs, rubs, gallops or clicks. No pedal edema. Gastrointestinal system: Abdomen is nondistended, soft and tender epigastrium ,  No organomegaly or masses felt. Normal bowel sounds heard. Central nervous system: Alert and oriented. No focal neurological deficits. Extremities: Symmetric 5 x 5 power. Skin: No rashes, lesions or ulcers Psychiatry: Judgement and insight appear normal. Mood & affect appropriate.     Data Reviewed: I have personally reviewed following labs and imaging studies  CBC:  Recent Labs Lab 03/09/17 1130  WBC 5.2  HGB 13.0  HCT 36.3*  MCV 87.3  PLT 192   Basic Metabolic Panel:  Recent Labs Lab 03/09/17 1130  NA 134*  K 3.5  CL 99*  CO2 25  GLUCOSE 190*  BUN 14  CREATININE 0.64  CALCIUM 8.8*   GFR: Estimated Creatinine Clearance: 130.3 mL/min (by C-G formula based on SCr of 0.64 mg/dL). Liver Function Tests:  Recent Labs Lab  03/09/17 1130  AST 35  ALT 24  ALKPHOS 80  BILITOT 0.8  PROT 7.0  ALBUMIN 3.8    Recent Labs Lab 03/09/17 1130  LIPASE 34   No results for input(s): AMMONIA in the last 168 hours. Coagulation Profile: No results for input(s): INR, PROTIME in the last 168 hours. Cardiac Enzymes: No results for input(s): CKTOTAL, CKMB, CKMBINDEX, TROPONINI in the last 168 hours. BNP (last 3 results) No results for input(s): PROBNP in the last 8760 hours. HbA1C: No results for input(s): HGBA1C in the last 72 hours. CBG:  Recent Labs Lab 03/09/17 2227 03/10/17 0745  GLUCAP 146* 146*   Lipid Profile: No results for input(s): CHOL, HDL, LDLCALC, TRIG, CHOLHDL, LDLDIRECT in the last 72 hours. Thyroid Function Tests: No results for input(s): TSH, T4TOTAL, FREET4,  T3FREE, THYROIDAB in the last 72 hours. Anemia Panel: No results for input(s): VITAMINB12, FOLATE, FERRITIN, TIBC, IRON, RETICCTPCT in the last 72 hours. Sepsis Labs: No results for input(s): PROCALCITON, LATICACIDVEN in the last 168 hours.  No results found for this or any previous visit (from the past 240 hour(s)).       Radiology Studies: Mr Abdomen W Or Wo Contrast  Result Date: 03/09/2017 CLINICAL DATA:  Abdominal pain, nausea/ vomiting, pancreatitis on CT. EXAM: MRI ABDOMEN WITHOUT AND WITH CONTRAST TECHNIQUE: Multiplanar multisequence MR imaging of the abdomen was performed both before and after the administration of intravenous contrast. CONTRAST:  18mL MULTIHANCE GADOBENATE DIMEGLUMINE 529 MG/ML IV SOLN COMPARISON:  CT abdomen/pelvis dated 03/09/2017 FINDINGS: Motion degraded images. Lower chest: Lung bases are clear. Hepatobiliary: Moderate geographic hepatic steatosis. No suspicious/enhancing hepatic lesions. Gallbladder is unremarkable. No intrahepatic or extrahepatic ductal dilatation. Pancreas: Within normal limits on MRI. No peripancreatic fluid/ inflammatory changes. Spleen:  Within normal limits. Adrenals/Urinary Tract:  Adrenal glands are within normal limits. 11 mm lesion along the lateral right upper kidney with suspected intrinsic T1 hyperintensity (series 1300/ image 61) but no convincing enhancement on postcontrast subtraction imaging (series 11301/image 61), favoring a benign hemorrhagic cyst. This has decreased in size when compared to 05/07/2014, previously measuring 1.8 cm. Left kidney is within normal limits.  No hydronephrosis. Stomach/Bowel: Stomach is within normal limits. Visualized bowel is unremarkable. Vascular/Lymphatic:  No evidence of abdominal aortic aneurysm. No suspicious abdominal lymphadenopathy. Other:  No abdominal ascites. Musculoskeletal: No focal osseous lesions. IMPRESSION: Motion degraded images. Pancreas is within normal limits on MRI. No  peripancreatic fluid/ inflammatory changes. 11 mm lesion along the lateral right upper kidney favors a hemorrhagic cyst, benign (Bosniak II). Notably, this has decreased in size since 2015. Electronically Signed   By: Charline Bills M.D.   On: 03/09/2017 21:35   Ct Abdomen Pelvis W Contrast  Result Date: 03/09/2017 CLINICAL DATA:  Evaluate for pancreatitis. EXAM: CT ABDOMEN AND PELVIS WITH CONTRAST TECHNIQUE: Multidetector CT imaging of the abdomen and pelvis was performed using the standard protocol following bolus administration of intravenous contrast. CONTRAST:  ISOVUE-300 IOPAMIDOL (ISOVUE-300) INJECTION 61% COMPARISON:  10/01/2015 and 05/07/2014 FINDINGS: Lower chest: No acute abnormality. Hepatobiliary: No focal liver abnormality is seen. No gallstones, gallbladder wall thickening, or biliary dilatation. Pancreas: There is no mass or fluid collections associated with the pancreas. Mild edema and peripancreatic fat stranding involves the body and proximal tail of pancreas, image number 20 of series 2. Findings are compatible with mild uncomplicated pancreatitis. Spleen: Normal in size without focal abnormality. Adrenals/Urinary Tract: The left kidney appears normal. There is a indeterminate right kidney lesion which measures 1.2 cm  and 50 HU postcontrast, image 24 of series 7. No hydronephrosis or hydroureter. The urinary bladder appears normal. Stomach/Bowel: Stomach is within normal limits. Appendix appears normal. No evidence of bowel wall thickening, distention, or inflammatory changes. Vascular/Lymphatic: No significant vascular findings are present. No enlarged abdominal or pelvic lymph nodes. Reproductive: Prostate is unremarkable. Other: No abdominal wall hernia or abnormality. No abdominopelvic ascites. Musculoskeletal: No acute or significant osseous findings. IMPRESSION: 1. Mild inflammatory changes involving the body and proximal tail of pancreas compatible with uncomplicated  pancreatitis. 2. Indeterminate right kidney lesion. This does not seem significantly changed from 05/07/2014. Nevertheless, findings may represent a hyperdense cyst or solid enhancing renal neoplasm. Consider more definitive assessment of this structure with nonemergent MRI of the kidneys. Electronically Signed   By: Signa Kell M.D.   On: 03/09/2017 13:48        Scheduled Meds: . enoxaparin (LOVENOX) injection  40 mg Subcutaneous Q24H  . folic acid  1 mg Oral Daily  . insulin aspart  0-5 Units Subcutaneous QHS  . insulin aspart  0-9 Units Subcutaneous TID WC  . multivitamin with minerals  1 tablet Oral Daily  . pantoprazole (PROTONIX) IV  40 mg Intravenous Q12H  . thiamine  100 mg Oral Daily   Or  . thiamine  100 mg Intravenous Daily   Continuous Infusions: . sodium chloride 100 mL/hr at 03/10/17 1000     LOS: 1 day    Time spent: 35 minutes.     Alba Cory, MD [facility] Pager [phone]  If 7PM-7AM, please contact night-coverage [email or web address] Password [email or web address] 03/10/2017, 11:13 AM

## 2017-03-10 NOTE — Progress Notes (Signed)
Called Dr. Sunnie Nielsenegalado to see if I could give him a dose of Dilaudid early to help with his pain and permission was given.

## 2017-03-11 DIAGNOSIS — K2900 Acute gastritis without bleeding: Secondary | ICD-10-CM

## 2017-03-11 DIAGNOSIS — F1099 Alcohol use, unspecified with unspecified alcohol-induced disorder: Secondary | ICD-10-CM

## 2017-03-11 DIAGNOSIS — E119 Type 2 diabetes mellitus without complications: Secondary | ICD-10-CM

## 2017-03-11 DIAGNOSIS — K219 Gastro-esophageal reflux disease without esophagitis: Secondary | ICD-10-CM

## 2017-03-11 DIAGNOSIS — I10 Essential (primary) hypertension: Secondary | ICD-10-CM

## 2017-03-11 DIAGNOSIS — F39 Unspecified mood [affective] disorder: Secondary | ICD-10-CM

## 2017-03-11 LAB — HEMOGLOBIN A1C
HEMOGLOBIN A1C: 6.5 % — AB (ref 4.8–5.6)
Mean Plasma Glucose: 140 mg/dL

## 2017-03-11 LAB — CBC
HCT: 34.1 % — ABNORMAL LOW (ref 39.0–52.0)
Hemoglobin: 12.1 g/dL — ABNORMAL LOW (ref 13.0–17.0)
MCH: 31.3 pg (ref 26.0–34.0)
MCHC: 35.5 g/dL (ref 30.0–36.0)
MCV: 88.1 fL (ref 78.0–100.0)
Platelets: 124 10*3/uL — ABNORMAL LOW (ref 150–400)
RBC: 3.87 MIL/uL — ABNORMAL LOW (ref 4.22–5.81)
RDW: 14.1 % (ref 11.5–15.5)
WBC: 13.5 10*3/uL — ABNORMAL HIGH (ref 4.0–10.5)

## 2017-03-11 LAB — BASIC METABOLIC PANEL
Anion gap: 9 (ref 5–15)
BUN: <5 mg/dL — ABNORMAL LOW (ref 6–20)
CO2: 22 mmol/L (ref 22–32)
Calcium: 8.6 mg/dL — ABNORMAL LOW (ref 8.9–10.3)
Chloride: 102 mmol/L (ref 101–111)
Creatinine, Ser: 0.65 mg/dL (ref 0.61–1.24)
GFR calc non Af Amer: >60 mL/min (ref >60)
Glucose, Bld: 187 mg/dL — ABNORMAL HIGH (ref 65–99)
POTASSIUM: 3.5 mmol/L (ref 3.5–5.1)
SODIUM: 133 mmol/L — AB (ref 135–145)

## 2017-03-11 LAB — GLUCOSE, CAPILLARY
GLUCOSE-CAPILLARY: 174 mg/dL — AB (ref 65–99)
Glucose-Capillary: 208 mg/dL — ABNORMAL HIGH (ref 65–99)
Glucose-Capillary: 189 mg/dL — ABNORMAL HIGH (ref 65–99)
Glucose-Capillary: 167 mg/dL — ABNORMAL HIGH (ref 65–99)

## 2017-03-11 LAB — HIV ANTIBODY (ROUTINE TESTING W REFLEX): HIV Screen 4th Generation wRfx: NONREACTIVE

## 2017-03-11 MED ORDER — PANTOPRAZOLE SODIUM 40 MG PO TBEC
40.0000 mg | DELAYED_RELEASE_TABLET | Freq: Two times a day (BID) | ORAL | Status: DC
  Administered 2017-03-11 – 2017-03-13 (×5): 40 mg via ORAL
  Filled 2017-03-11 (×5): qty 1

## 2017-03-11 MED ORDER — SERTRALINE HCL 100 MG PO TABS
100.0000 mg | ORAL_TABLET | Freq: Every day | ORAL | Status: DC
  Administered 2017-03-12 – 2017-03-13 (×2): 100 mg via ORAL
  Filled 2017-03-11 (×2): qty 1

## 2017-03-11 MED ORDER — GI COCKTAIL ~~LOC~~
30.0000 mL | Freq: Once | ORAL | Status: AC
  Administered 2017-03-11: 30 mL via ORAL
  Filled 2017-03-11: qty 30

## 2017-03-11 MED ORDER — SUCRALFATE 1 GM/10ML PO SUSP
1.0000 g | Freq: Three times a day (TID) | ORAL | Status: DC
Start: 2017-03-11 — End: 2017-03-13
  Administered 2017-03-11 – 2017-03-13 (×7): 1 g via ORAL
  Filled 2017-03-11 (×7): qty 10

## 2017-03-11 MED ORDER — HYOSCYAMINE SULFATE 0.5 MG/ML IJ SOLN
0.5000 mg | Freq: Four times a day (QID) | INTRAMUSCULAR | Status: DC | PRN
  Administered 2017-03-11 – 2017-03-12 (×2): 0.5 mg via INTRAVENOUS
  Filled 2017-03-11 (×5): qty 1

## 2017-03-11 MED ORDER — QUETIAPINE FUMARATE 25 MG PO TABS
100.0000 mg | ORAL_TABLET | Freq: Every day | ORAL | Status: DC
  Administered 2017-03-11 – 2017-03-12 (×2): 100 mg via ORAL
  Filled 2017-03-11 (×2): qty 4

## 2017-03-11 NOTE — Progress Notes (Signed)
PROGRESS NOTE    David Doyle  [id number] DOB: 1971-06-25 DOA: 03/09/2017 PCP: [facility]   Brief Narrative: David Doyle is a 46 y.o. male with medical history significant of alcohol abuse, homeless, hypertension and DM presents with nausea, vomiting, abdominal pain and diarrhea for 2 days. Abdominal pain left upper quadrant and epigastric area, radiating to the back, . On arrival to ED,. His labs were remarkable for mild hyponatremia. CT abd shows non complicated pancreatitis. He was referred to medical service for admission.   Assessment & Plan:   Active Problems:   Pancreatitis Gastritis/GERD Alcohol abuse HTN DM type 2 Mood disorder   1-Abdominal pain; could be related to mild pancreatitis, although lipase normal, CT abdomen did showed mild inflammation in the pancreas. MRI was negative but was limited. Other concerns includes Gastritis/GERD from alcohol.  -continue PPI, but will attempt transition to PO -continue IVF's, supportive care, PRN antiemetics and analgesics -slowly advance diet -will start Carafate -no fever, WBC's elevated  2-Alcohol use.  -no signs of withdrawal so far -continue CIWA monitoring -continue thiamine and folic acid -cessation counseling gprovided   3-HTN;  -continue PRN hydralazine.   4-DM: -HBA1c 6.5.  -continue SSI.  -continue holding metformin while inpatient.   5-Mood disorder;  -no SI or hallucinations -will Resume home meds (Seroquel and Zoloft)    6-gastritis/GERD -continue PPI -start carafate  DVT prophylaxis: lovenox.  Code Status: Full code.  Family Communication: care discussed with patient.  Disposition Plan: home at time of discharge. Remains inpatient. Continue supportive care, continue pain meds and PRN antiemetics. Struggling with diet advance and keeping things down.  Consultants:   None    Procedures: see below for x-ray reports    Antimicrobials:  none   Subjective: Afebrile; still with severe abdominal pain, nausea and vomiting. Patient with difficulty keeping anything down and afraid to advance diet     Objective: Vitals:   03/10/17 1819 03/10/17 2359 03/11/17 0523 03/11/17 1500  BP: (!) 165/105 (!) 158/98 (!) 138/99 (!) 155/88  Pulse:  (!) 109 (!) 111 100  Resp:  20 18 18   Temp:  98.8 F (37.1 C) 98.2 F (36.8 C) 98.4 F (36.9 C)  TempSrc:  Oral Oral Oral  SpO2:  98% 99% 100%  Weight:      Height:        Intake/Output Summary (Last 24 hours) at 03/11/17 1815 Last data filed at 03/11/17 1738  Gross per 24 hour  Intake          2954.17 ml  Output              250 ml  Net          2704.17 ml   Filed Weights   03/09/17 1130 03/09/17 2138  Weight: 86.2 kg (190 lb) 88.1 kg (194 lb 3.6 oz)    Examination:  General exam: Appears ok, in no acute distress. Still complaining of abdominal pain, nausea and vomiting. Said that he has no move BM and is struggling with liquid diet.  Respiratory system: Clear to auscultation. Respiratory effort normal. Cardiovascular system: S1 & S2 heard, RRR. No JVD, murmurs, rubs, gallops or clicks. No pedal edema. Gastrointestinal system: Abdomen is nondistended, soft and tender epigastrium/mid abd,  No organomegaly or masses felt. Normal bowel sounds heard. No ascites  Central nervous system: Alert and oriented. No focal neurological deficits. Extremities: Symmetric 5 x 5 power. Skin: No rashes, lesions or ulcers  Psychiatry: Judgement and insight appear normal. Mood & affect appropriate.    Data Reviewed: I have personally reviewed following labs and imaging studies  CBC:  Recent Labs Lab 03/09/17 1130 03/11/17 0451  WBC 5.2 13.5*  HGB 13.0 12.1*  HCT 36.3* 34.1*  MCV 87.3 88.1  PLT 192 124*   Basic Metabolic Panel:  Recent Labs Lab 03/09/17 1130 03/11/17 0451  NA 134* 133*  K 3.5 3.5  CL 99* 102  CO2 25 22  GLUCOSE 190* 187*  BUN 14 <5*  CREATININE 0.64 0.65   CALCIUM 8.8* 8.6*   GFR: Estimated Creatinine Clearance: 130.3 mL/min (by C-G formula based on SCr of 0.65 mg/dL).   Liver Function Tests:  Recent Labs Lab 03/09/17 1130  AST 35  ALT 24  ALKPHOS 80  BILITOT 0.8  PROT 7.0  ALBUMIN 3.8    Recent Labs Lab 03/09/17 1130  LIPASE 34   HbA1C:  Recent Labs  03/09/17 1138  HGBA1C 6.5*   CBG:  Recent Labs Lab 03/10/17 1546 03/10/17 2003 03/11/17 0815 03/11/17 1155 03/11/17 1642  GLUCAP 233* 168* 208* 189* 167*   Sepsis Labs: No results for input(s): PROCALCITON, LATICACIDVEN in the last 168 hours.  No results found for this or any previous visit (from the past 240 hour(s)).    Radiology Studies: Mr Abdomen W Or Wo Contrast  Result Date: 03/09/2017 CLINICAL DATA:  Abdominal pain, nausea/ vomiting, pancreatitis on CT. EXAM: MRI ABDOMEN WITHOUT AND WITH CONTRAST TECHNIQUE: Multiplanar multisequence MR imaging of the abdomen was performed both before and after the administration of intravenous contrast. CONTRAST:  18mL MULTIHANCE GADOBENATE DIMEGLUMINE 529 MG/ML IV SOLN COMPARISON:  CT abdomen/pelvis dated 03/09/2017 FINDINGS: Motion degraded images. Lower chest: Lung bases are clear. Hepatobiliary: Moderate geographic hepatic steatosis. No suspicious/enhancing hepatic lesions. Gallbladder is unremarkable. No intrahepatic or extrahepatic ductal dilatation. Pancreas: Within normal limits on MRI. No peripancreatic fluid/ inflammatory changes. Spleen:  Within normal limits. Adrenals/Urinary Tract:  Adrenal glands are within normal limits. 11 mm lesion along the lateral right upper kidney with suspected intrinsic T1 hyperintensity (series 1300/ image 61) but no convincing enhancement on postcontrast subtraction imaging (series 11301/image 61), favoring a benign hemorrhagic cyst. This has decreased in size when compared to 05/07/2014, previously measuring 1.8 cm. Left kidney is within normal limits.  No hydronephrosis. Stomach/Bowel:  Stomach is within normal limits. Visualized bowel is unremarkable. Vascular/Lymphatic:  No evidence of abdominal aortic aneurysm. No suspicious abdominal lymphadenopathy. Other:  No abdominal ascites. Musculoskeletal: No focal osseous lesions. IMPRESSION: Motion degraded images. Pancreas is within normal limits on MRI. No peripancreatic fluid/ inflammatory changes. 11 mm lesion along the lateral right upper kidney favors a hemorrhagic cyst, benign (Bosniak II). Notably, this has decreased in size since 2015. Electronically Signed   By: Charline Bills M.D.   On: 03/09/2017 21:35    Scheduled Meds: . enoxaparin (LOVENOX) injection  40 mg Subcutaneous Q24H  . folic acid  1 mg Oral Daily  . insulin aspart  0-9 Units Subcutaneous TID WC  . multivitamin with minerals  1 tablet Oral Daily  . pantoprazole  40 mg Oral BID  . sucralfate  1 g Oral TID  . thiamine  100 mg Oral Daily   Continuous Infusions: . sodium chloride 125 mL/hr at 03/11/17 1315     LOS: 2 days    Time spent: 25 minutes.     Vassie Loll, MD [facility] Pager [phone]  If 7PM-7AM, please contact night-coverage [email or web address] Password [email or web address] 03/11/2017,  6:15 PM

## 2017-03-12 ENCOUNTER — Inpatient Hospital Stay (HOSPITAL_COMMUNITY): Payer: Medicaid Other

## 2017-03-12 ENCOUNTER — Encounter (HOSPITAL_COMMUNITY): Payer: Self-pay | Admitting: Radiology

## 2017-03-12 DIAGNOSIS — F329 Major depressive disorder, single episode, unspecified: Secondary | ICD-10-CM

## 2017-03-12 DIAGNOSIS — N5089 Other specified disorders of the male genital organs: Secondary | ICD-10-CM

## 2017-03-12 DIAGNOSIS — N50819 Testicular pain, unspecified: Secondary | ICD-10-CM

## 2017-03-12 LAB — COMPREHENSIVE METABOLIC PANEL
ALT: 13 U/L — ABNORMAL LOW (ref 17–63)
ANION GAP: 9 (ref 5–15)
AST: 22 U/L (ref 15–41)
Albumin: 3.5 g/dL (ref 3.5–5.0)
Alkaline Phosphatase: 58 U/L (ref 38–126)
CO2: 26 mmol/L (ref 22–32)
CREATININE: 0.7 mg/dL (ref 0.61–1.24)
Calcium: 8.8 mg/dL — ABNORMAL LOW (ref 8.9–10.3)
Chloride: 99 mmol/L — ABNORMAL LOW (ref 101–111)
Glucose, Bld: 173 mg/dL — ABNORMAL HIGH (ref 65–99)
POTASSIUM: 3.2 mmol/L — AB (ref 3.5–5.1)
SODIUM: 134 mmol/L — AB (ref 135–145)
Total Bilirubin: 1.3 mg/dL — ABNORMAL HIGH (ref 0.3–1.2)
Total Protein: 7.1 g/dL (ref 6.5–8.1)

## 2017-03-12 LAB — CBC
HEMATOCRIT: 32.9 % — AB (ref 39.0–52.0)
Hemoglobin: 11.7 g/dL — ABNORMAL LOW (ref 13.0–17.0)
MCH: 31.5 pg (ref 26.0–34.0)
MCHC: 35.6 g/dL (ref 30.0–36.0)
MCV: 88.7 fL (ref 78.0–100.0)
Platelets: 121 10*3/uL — ABNORMAL LOW (ref 150–400)
RBC: 3.71 MIL/uL — AB (ref 4.22–5.81)
RDW: 14.7 % (ref 11.5–15.5)
WBC: 10.1 10*3/uL (ref 4.0–10.5)

## 2017-03-12 LAB — LACTIC ACID, PLASMA: LACTIC ACID, VENOUS: 1.1 mmol/L (ref 0.5–1.9)

## 2017-03-12 LAB — GLUCOSE, CAPILLARY
GLUCOSE-CAPILLARY: 145 mg/dL — AB (ref 65–99)
GLUCOSE-CAPILLARY: 149 mg/dL — AB (ref 65–99)
GLUCOSE-CAPILLARY: 186 mg/dL — AB (ref 65–99)
Glucose-Capillary: 145 mg/dL — ABNORMAL HIGH (ref 65–99)

## 2017-03-12 MED ORDER — IOPAMIDOL (ISOVUE-300) INJECTION 61%
INTRAVENOUS | Status: AC
  Filled 2017-03-12: qty 30

## 2017-03-12 MED ORDER — IOPAMIDOL (ISOVUE-300) INJECTION 61%
30.0000 mL | Freq: Once | INTRAVENOUS | Status: AC | PRN
  Administered 2017-03-12: 30 mL via ORAL

## 2017-03-12 MED ORDER — POTASSIUM CHLORIDE CRYS ER 20 MEQ PO TBCR
40.0000 meq | EXTENDED_RELEASE_TABLET | ORAL | Status: AC
  Administered 2017-03-12 (×2): 40 meq via ORAL
  Filled 2017-03-12 (×2): qty 2

## 2017-03-12 MED ORDER — IOPAMIDOL (ISOVUE-300) INJECTION 61%
INTRAVENOUS | Status: AC
  Administered 2017-03-12: 100 mL
  Filled 2017-03-12: qty 100

## 2017-03-12 NOTE — Progress Notes (Addendum)
Patient reports scrotal swelling. States this happens each time he is in the hospital and they normally give him more pain medicine. Pt offered ice and educated to keep nondependent as much as possible. On call MD notified.

## 2017-03-12 NOTE — Progress Notes (Signed)
PROGRESS NOTE    David Doyle  GEX:[id number] DOB: [birth year] DOA: 03/09/2017 PCP: [facility]   Brief Narrative: David Doyle is a 46 y.o. male with medical history significant of alcohol abuse, homeless, hypertension and DM presents with nausea, vomiting, abdominal pain and diarrhea for 2 days. Abdominal pain left upper quadrant and epigastric area, radiating to the back, . On arrival to ED,. His labs were remarkable for mild hyponatremia. CT abd shows non complicated pancreatitis. He was referred to medical service for admission.   Assessment & Plan:   Active Problems:   Pancreatitis Gastritis/GERD Alcohol abuse HTN DM type 2 Mood disorder   1-Abdominal pain; could be related to mild pancreatitis, although lipase normal, CT abdomen did showed mild inflammation in the pancreas. MRI was negative but was limited. Other concerns includes Gastritis/GERD from alcohol.  -continue PPI by mouth -now that is not longer vomiting, will change IVF's rate -continue supportive care, PRN antiemetics and analgesics -continue slowly advancing diet -will continue Carafate -no fever, WBC's now WNL; most likely associated with demargination  2-Alcohol use.  -no signs of withdrawal so far -continue CIWA monitoring -continue thiamine and folic acid -cessation counseling gprovided   3-HTN;  -continue PRN hydralazine.   4-DM: -HBA1c 6.5.  -continue SSI.  -continue holding metformin while inpatient.   5-Mood disorder;  -no SI or hallucinations -will continue home meds (Seroquel and Zoloft)    6-gastritis/GERD -continue PPI and carafate  7-scrotum swelling and testicular pain -new and acutely developed -will check scrotum US to r/o torsion (patient with significant pain and endorsing some discoloration; even not seen on my exam) -will also check CT abd/pelvis if [place] neg, to assess better for potential inguinal hernia. -will check lactic  acid  8-constipation -started on bowel regimen -will follow response and if needed add suppository/enema.   DVT prophylaxis: lovenox.  Code Status: Full code.  Family Communication: care discussed with patient.  Disposition Plan: home at time of discharge. Remains inpatient. Continue supportive care, continue pain meds and PRN antiemetics. Will continue advancing diet; will check scrotum US and CT abd/pelvis.   Consultants:   None    Procedures: see below for x-ray reports    Antimicrobials: none   Subjective: Afebrile; still with some abdominal pain and intermittent nausea; no further vomiting and looking to have diet advanced. Complaining of scrotum swelling and testicular pain.   Objective: Vitals:   03/11/17 2100 03/12/17 0454 03/12/17 1200 03/12/17 1438  BP: (!) 155/84 111/85 138/82 (!) 149/90  Pulse: (!) 110 (!) 123 97 (!) 117  Resp: 18 20  18   Temp: 98.6 F (37 C) 97.7 F (36.5 C)  99 F (37.2 C)  TempSrc: Oral Oral  Oral  SpO2: 97% 99%  99%  Weight:      Height:        Intake/Output Summary (Last 24 hours) at 03/12/17 1800 Last data filed at 03/12/17 1753  Gross per 24 hour  Intake             3015 ml  Output              700 ml  Net             2315 ml   Filed Weights   03/09/17 1130 03/09/17 2138  Weight: 86.2 kg (190 lb) 88.1 kg (194 lb 3.6 oz)    Examination: General exam: reports improvement in his abd pain and is not longer  vomiting. still with mild intermittent nausea. Patient will like to be advanced. Complaining of acute scrotum swelling and testicular pain. Also with constipation.  Respiratory system: Clear to auscultation. Respiratory effort normal. Cardiovascular system: S1 & S2 heard, RRR. No JVD, murmurs, rubs, gallops or clicks. No pedal edema. Gastrointestinal system: Abdomen is nondistended, soft and tender epigastrium/mid abd,  No organomegaly or masses felt. Normal bowel sounds heard. No ascites  GU: swollen scrotum and groin area  (L > R); tender to palpation and per patient concerns with slight bluish discoloration  Central nervous system: Alert and oriented. No focal neurological deficits. Extremities: Symmetric 5 x 5 power. Skin: No rashes, lesions or ulcers Psychiatry: Judgement and insight appear normal. Mood & affect appropriate.    Data Reviewed: I have personally reviewed following labs and imaging studies  CBC:  Recent Labs Lab 03/09/17 1130 03/11/17 0451 03/12/17 0441  WBC 5.2 13.5* 10.1  HGB 13.0 12.1* 11.7*  HCT 36.3* 34.1* 32.9*  MCV 87.3 88.1 88.7  PLT 192 124* 121*   Basic Metabolic Panel:  Recent Labs Lab 03/09/17 1130 03/11/17 0451 03/12/17 0441  NA 134* 133* 134*  K 3.5 3.5 3.2*  CL 99* 102 99*  CO2 25 22 26   GLUCOSE 190* 187* 173*  BUN 14 <5* <5*  CREATININE 0.64 0.65 0.70  CALCIUM 8.8* 8.6* 8.8*   GFR: Estimated Creatinine Clearance: 130.3 mL/min (by C-G formula based on SCr of 0.7 mg/dL).   Liver Function Tests:  Recent Labs Lab 03/09/17 1130 03/12/17 0441  AST 35 22  ALT 24 13*  ALKPHOS 80 58  BILITOT 0.8 1.3*  PROT 7.0 7.1  ALBUMIN 3.8 3.5    Recent Labs Lab 03/09/17 1130  LIPASE 34   CBG:  Recent Labs Lab 03/11/17 1642 03/11/17 2135 03/12/17 0721 03/12/17 1148 03/12/17 1545  GLUCAP 167* 174* 145* 145* 149*   No results found for this or any previous visit (from the past 240 hour(s)).    Radiology Studies: [place] Scrotum  Result Date: 03/12/2017 CLINICAL DATA:  Scrotal swelling and pain . EXAM: SCROTAL ULTRASOUND DOPPLER ULTRASOUND OF THE TESTICLES TECHNIQUE: Complete ultrasound examination of the testicles, epididymis, and other scrotal structures was performed. Color and spectral Doppler ultrasound were also utilized to evaluate blood flow to the testicles. COMPARISON:  CT 03/09/2017 . FINDINGS: Right testicle Measurements: 4.9 x 2.5 x 2.8 cm. No mass or microlithiasis visualized. Left testicle Measurements: 4.4 x 2.8 x 3.7 cm. 3 mm cyst. No  microlithiasis visualized. Right epididymis:  Normal in size and appearance. Left epididymis:  Normal in size and appearance. Hydrocele:  Small left hydrocele. Varicocele: None visualized. Left inguinal hernia with herniation of fat appears to be present. No bowel herniation noted . Confirmation with IV and oral contrast enhanced CT of the abdomen pelvis can be obtained. Scrotal wall edema noted. Pulsed Doppler interrogation of both testes demonstrates normal low resistance arterial and venous waveforms bilaterally. IMPRESSION: 1. Left inguinal hernia with herniation of fat. No bowel herniation noted. Confirmation with IV and oral contrast enhanced CT of the abdomen pelvis can be obtained. Small left hydrocele. Scrotal wall edema noted. 2. No evidence of testicular torsion or significant testicular mass. Electronically Signed   By: Maisie Fushomas  Register   On: 03/12/2017 13:12   [place] Art/ven Flow Abd Pelv Doppler  Result Date: 03/12/2017 CLINICAL DATA:  Scrotal swelling and pain . EXAM: SCROTAL ULTRASOUND DOPPLER ULTRASOUND OF THE TESTICLES TECHNIQUE: Complete ultrasound examination of the testicles, epididymis, and other scrotal  structures was performed. Color and spectral Doppler ultrasound were also utilized to evaluate blood flow to the testicles. COMPARISON:  CT 03/09/2017 . FINDINGS: Right testicle Measurements: 4.9 x 2.5 x 2.8 cm. No mass or microlithiasis visualized. Left testicle Measurements: 4.4 x 2.8 x 3.7 cm. 3 mm cyst. No microlithiasis visualized. Right epididymis:  Normal in size and appearance. Left epididymis:  Normal in size and appearance. Hydrocele:  Small left hydrocele. Varicocele: None visualized. Left inguinal hernia with herniation of fat appears to be present. No bowel herniation noted . Confirmation with IV and oral contrast enhanced CT of the abdomen pelvis can be obtained. Scrotal wall edema noted. Pulsed Doppler interrogation of both testes demonstrates normal low resistance arterial and  venous waveforms bilaterally. IMPRESSION: 1. Left inguinal hernia with herniation of fat. No bowel herniation noted. Confirmation with IV and oral contrast enhanced CT of the abdomen pelvis can be obtained. Small left hydrocele. Scrotal wall edema noted. 2. No evidence of testicular torsion or significant testicular mass. Electronically Signed   By: Maisie Fus  Register   On: 03/12/2017 13:12    Scheduled Meds: . enoxaparin (LOVENOX) injection  40 mg Subcutaneous Q24H  . folic acid  1 mg Oral Daily  . insulin aspart  0-9 Units Subcutaneous TID WC  . iopamidol      . multivitamin with minerals  1 tablet Oral Daily  . pantoprazole  40 mg Oral BID  . QUEtiapine  100 mg Oral QHS  . sertraline  100 mg Oral Daily  . sucralfate  1 g Oral TID  . thiamine  100 mg Oral Daily   Continuous Infusions: . sodium chloride Stopped (03/12/17 1248)     LOS: 3 days    Time spent: 25 minutes.     Vassie Loll, MD [facility] Pager [phone]  If 7PM-7AM, please contact night-coverage [email or web address] Password [facility] 03/12/2017, 6:00 PM

## 2017-03-12 NOTE — Progress Notes (Signed)
Pt stated he still doesn't feel 100% but he needs to leave and go to social services so that he can have a bed to sleep in before the snow hits. Patient is requesting a bus pass. David Doyle

## 2017-03-13 DIAGNOSIS — I10 Essential (primary) hypertension: Secondary | ICD-10-CM

## 2017-03-13 DIAGNOSIS — K4090 Unilateral inguinal hernia, without obstruction or gangrene, not specified as recurrent: Secondary | ICD-10-CM

## 2017-03-13 DIAGNOSIS — E119 Type 2 diabetes mellitus without complications: Secondary | ICD-10-CM

## 2017-03-13 DIAGNOSIS — K219 Gastro-esophageal reflux disease without esophagitis: Secondary | ICD-10-CM

## 2017-03-13 DIAGNOSIS — K4020 Bilateral inguinal hernia, without obstruction or gangrene, not specified as recurrent: Secondary | ICD-10-CM

## 2017-03-13 DIAGNOSIS — F329 Major depressive disorder, single episode, unspecified: Secondary | ICD-10-CM

## 2017-03-13 DIAGNOSIS — N281 Cyst of kidney, acquired: Secondary | ICD-10-CM

## 2017-03-13 DIAGNOSIS — F32A Depression, unspecified: Secondary | ICD-10-CM

## 2017-03-13 LAB — GLUCOSE, CAPILLARY
Glucose-Capillary: 159 mg/dL — ABNORMAL HIGH (ref 65–99)
Glucose-Capillary: 168 mg/dL — ABNORMAL HIGH (ref 65–99)

## 2017-03-13 MED ORDER — QUETIAPINE FUMARATE 100 MG PO TABS: 100.0000 mg | ORAL_TABLET | Freq: Every day | ORAL | 0 refills | Status: AC

## 2017-03-13 MED ORDER — LOSARTAN POTASSIUM 50 MG PO TABS: 50.0000 mg | ORAL_TABLET | Freq: Every day | ORAL | 1 refills | Status: AC

## 2017-03-13 MED ORDER — POLYETHYLENE GLYCOL 3350 17 G PO PACK
17.0000 g | PACK | Freq: Every day | ORAL | 0 refills | Status: DC
Start: 2017-03-13 — End: 2017-04-22

## 2017-03-13 MED ORDER — SERTRALINE HCL 100 MG PO TABS: 150.0000 mg | ORAL_TABLET | Freq: Every day | ORAL | 0 refills | Status: AC

## 2017-03-13 MED ORDER — THIAMINE HCL 100 MG PO TABS: 100.0000 mg | ORAL_TABLET | Freq: Every day | ORAL | 1 refills | Status: AC

## 2017-03-13 MED ORDER — DOCUSATE SODIUM 100 MG PO CAPS: 100.0000 mg | ORAL_CAPSULE | Freq: Two times a day (BID) | ORAL | 0 refills | Status: DC

## 2017-03-13 MED ORDER — METFORMIN HCL 500 MG PO TABS: 500.0000 mg | ORAL_TABLET | Freq: Two times a day (BID) | ORAL | 1 refills | Status: AC

## 2017-03-13 MED ORDER — PANTOPRAZOLE SODIUM 40 MG PO TBEC: 40.0000 mg | DELAYED_RELEASE_TABLET | Freq: Two times a day (BID) | ORAL | 0 refills | Status: AC

## 2017-03-13 MED ORDER — PRAZOSIN HCL 2 MG PO CAPS: 2.0000 mg | ORAL_CAPSULE | Freq: Every day | ORAL | 0 refills | Status: AC

## 2017-03-13 MED ORDER — ATORVASTATIN CALCIUM 40 MG PO TABS: 40.0000 mg | ORAL_TABLET | Freq: Every day | ORAL | 0 refills | Status: AC

## 2017-03-13 MED ORDER — OXYCODONE-ACETAMINOPHEN 5-325 MG PO TABS
1.0000 | ORAL_TABLET | Freq: Four times a day (QID) | ORAL | 0 refills | Status: DC | PRN
Start: 2017-03-13 — End: 2017-04-22

## 2017-03-13 NOTE — Progress Notes (Signed)
Pt ambulating in hallway and tolerating his regular diet.  D/C instructions were given as well as prescriptions. Understanding was verbalized.  He was previously given a bus pass but today he said his grandmother was picking him up.

## 2017-03-13 NOTE — Progress Notes (Signed)
Spoke with attending, updated on previous conversation with patient and resources provided.

## 2017-03-13 NOTE — Discharge Summary (Signed)
Physician Discharge Summary  David Doyle:[id number] DOB: 1971/05/22 DOA: 03/09/2017  PCP: [facility]  Admit date: 03/09/2017 Discharge date: 03/13/2017  Time spent: 35 minutes  Recommendations for Outpatient Follow-up:  1. Repeat BMET to follow electrolytes and renal function  2. Please reassess BP and adjust medications as needed 3. Close follow up to his diabetes and adjust hypoglycemic regimen as needed 4. Arrange for non emergent MRI (to follow renal cyst) 5. Needs also outpatient follow up with general surgery for further evaluation of inguinal hernia.    Discharge Diagnoses:  Active Problems:   Pancreatitis   Inguinal hernia   Renal cyst   Type 2 diabetes mellitus without complication (HCC)   Benign essential HTN   Depression   Gastroesophageal reflux disease   Discharge Condition: stable and improved. Discharge with instructions to follow up with PCP in 10 days.  Diet recommendation: low fat and modified carbohydrates   Filed Weights   03/09/17 1130 03/09/17 2138  Weight: 86.2 kg (190 lb) 88.1 kg (194 lb 3.6 oz)    History of present illness:  46 y.o.malewith medical history significant of alcohol abuse, homeless, hypertension and DM presents with nausea, vomiting, abdominal pain and diarrhea for 2 days. Abdominal pain left upper quadrant and epigastric area, radiating to the back, . On arrival to ED,. His labs were remarkable for mild hyponatremia. CT abd shows non complicated pancreatitis.  Hospital Course:  1-Abdominal pain; could be related to mild pancreatitis, although lipase normal, CT abdomen did showed mild inflammation in the pancreas. MRI was negative but was limited. Other concerns includes Gastritis/GERD from alcohol.  -discharge in stable condition and tolerating diet. -continue adequate hydration and PRN analgesics -advise to follow low fat diet  -will continue PPI -no fever, WBC's now WNL; most likely associated  with demargination  2-Alcohol use.  -no signs of withdrawal seen -advise to quit drinking -discharge on thiamine and encourage on taking Multivitamins  3-HTN;  -continue home antihypertensive regimen and low sodium diet   4-DM: -HBA1c 6.5.  -continue metformin and Lantus -advise to follow low carb diet   5-Mood disorder;  -no SI or hallucinations -will continue home meds (Seroquel and Zoloft)    6-gastritis/GERD -continue PPI  -advise to quit alcohol intake  7-scrotum swelling and testicular pain: due to inguinal hernia -new and acutely developed; while straining to move Bowels according to patient. -no incarceration appreciated and testicular torsion r/o with scrotum US -discussed with surgery who will follow as an outpatient for evaluation and if needed elective repair.  8-constipation -started on bowel regimen -advise to follow increased fiber on his diet and adequate hydration.  9-renal cyst -will need non-emergent renal MRI for follow up. -no red flags symptoms for concerns of renal cancer currently  Procedures:  See below for x-ray reports   Consultations:  Curbside general surgery (will see as an outpatient for evaluation of inguinal hernia and if needed elective surgery)  Discharge Exam: Vitals:   03/12/17 2110 03/13/17 0700  BP: (!) 131/92 124/84  Pulse: (!) 110 96  Resp: 20 16  Temp: 97.5 F (36.4 C)    General exam: reports improvement in his abd pain and is not longer experiencing nausea or vomiting. Patient tolerating diet. Respiratory system: Clear to auscultation. Respiratory effort normal. Cardiovascular system: S1 & S2 heard, RRR. No JVD, murmurs, rubs, gallops or clicks. No pedal edema. Gastrointestinal system: Abdomen is nondistended, soft and just mildly tender on palpation of epigastrium/mid  abd,  No organomegaly or masses felt. Normal bowel sounds heard. No ascites  GU: still with slight swelling of his scrotum and groin area (L >  R); per patient, no tenderness to palpation and significant improved today.   Central nervous system: Alert and oriented. No focal neurological deficits. Extremities: Symmetric 5 x 5 power. Skin: No rashes, lesions or ulcers Psychiatry: Judgement and insight appear normal. Mood & affect appropriate.    Discharge Instructions   Discharge Instructions    Diet - low sodium heart healthy    Complete by:  As directed    Diet Carb Modified    Complete by:  As directed    Discharge instructions    Complete by:  As directed    Keep yourself well hydrated Follow low fat and modified carbohydrates diet  Please increase fiber intake in your diet Take medications as prescribed Arrange follow up with PCP in 10 days Contact general surgery office for outpatient appointment to been evaluated for elective inguinal hernia repair.     Current Discharge Medication List    START taking these medications   Details  docusate sodium (COLACE) 100 MG capsule Take 1 capsule (100 mg total) by mouth 2 (two) times daily. Qty: 60 capsule, Refills: 0    oxyCODONE-acetaminophen (PERCOCET/ROXICET) 5-325 MG tablet Take 1 tablet by mouth every 6 (six) hours as needed for severe pain. Qty: 20 tablet, Refills: 0    polyethylene glycol (MIRALAX) packet Take 17 g by mouth daily. Qty: 28 each, Refills: 0    thiamine 100 MG tablet Take 1 tablet (100 mg total) by mouth daily. Qty: 30 tablet, Refills: 1      CONTINUE these medications which have CHANGED   Details  atorvastatin (LIPITOR) 40 MG tablet Take 1 tablet (40 mg total) by mouth daily. For high cholesterol Qty: 30 tablet, Refills: 0    losartan (COZAAR) 50 MG tablet Take 1 tablet (50 mg total) by mouth daily. Qty: 30 tablet, Refills: 1    metFORMIN (GLUCOPHAGE) 500 MG tablet Take 1 tablet (500 mg total) by mouth 2 (two) times daily with a meal. For diabetes management Qty: 60 tablet, Refills: 1    pantoprazole (PROTONIX) 40 MG tablet Take 1 tablet  (40 mg total) by mouth 2 (two) times daily. For acid reflux Qty: 30 tablet, Refills: 0    prazosin (MINIPRESS) 2 MG capsule Take 1 capsule (2 mg total) by mouth at bedtime. Qty: 30 capsule, Refills: 0    QUEtiapine (SEROQUEL) 100 MG tablet Take 1 tablet (100 mg total) by mouth at bedtime. Qty: 30 tablet, Refills: 0    sertraline (ZOLOFT) 100 MG tablet Take 1.5 tablets (150 mg total) by mouth daily. For depression Qty: 45 tablet, Refills: 0      CONTINUE these medications which have NOT CHANGED   Details  insulin detemir (LEVEMIR) 100 UNIT/ML injection Inject 0.1 mLs (10 Units total) into the skin at bedtime. Qty: 10 mL, Refills: 11      STOP taking these medications     ondansetron (ZOFRAN) 4 MG tablet      topiramate (TOPAMAX) 50 MG tablet        No Known Allergies Follow-up Information    [facility] Schedule an appointment as soon as possible for a visit in 10 day(s).   Specialty:  Pediatrics Contact information: [address] [place] [address] [phone]        [facility]. Call in  2 week(s).   Specialty:  General Surgery Why:  to set up appointment for evaluation of inguinal hernia. Contact information: [address] [place] [address] [phone]           The results of significant diagnostics from this hospitalization (including imaging, microbiology, ancillary and laboratory) are listed below for reference.    Significant Diagnostic Studies: Mr Abdomen W Or Wo Contrast  Result Date: 03/09/2017 CLINICAL DATA:  Abdominal pain, nausea/ vomiting, pancreatitis on CT. EXAM: MRI ABDOMEN WITHOUT AND WITH CONTRAST TECHNIQUE: Multiplanar multisequence MR imaging of the abdomen was performed both before and after the administration of intravenous contrast. CONTRAST:  18mL MULTIHANCE GADOBENATE DIMEGLUMINE 529 MG/ML IV SOLN COMPARISON:  CT abdomen/pelvis dated 03/09/2017 FINDINGS: Motion degraded  images. Lower chest: Lung bases are clear. Hepatobiliary: Moderate geographic hepatic steatosis. No suspicious/enhancing hepatic lesions. Gallbladder is unremarkable. No intrahepatic or extrahepatic ductal dilatation. Pancreas: Within normal limits on MRI. No peripancreatic fluid/ inflammatory changes. Spleen:  Within normal limits. Adrenals/Urinary Tract:  Adrenal glands are within normal limits. 11 mm lesion along the lateral right upper kidney with suspected intrinsic T1 hyperintensity (series 1300/ image 61) but no convincing enhancement on postcontrast subtraction imaging (series 11301/image 61), favoring a benign hemorrhagic cyst. This has decreased in size when compared to 05/07/2014, previously measuring 1.8 cm. Left kidney is within normal limits.  No hydronephrosis. Stomach/Bowel: Stomach is within normal limits. Visualized bowel is unremarkable. Vascular/Lymphatic:  No evidence of abdominal aortic aneurysm. No suspicious abdominal lymphadenopathy. Other:  No abdominal ascites. Musculoskeletal: No focal osseous lesions. IMPRESSION: Motion degraded images. Pancreas is within normal limits on MRI. No peripancreatic fluid/ inflammatory changes. 11 mm lesion along the lateral right upper kidney favors a hemorrhagic cyst, benign (Bosniak II). Notably, this has decreased in size since 2015. Electronically Signed   By: Charline Bills M.D.   On: 03/09/2017 21:35   US Scrotum  Result Date: 03/12/2017 CLINICAL DATA:  Scrotal swelling and pain . EXAM: SCROTAL ULTRASOUND DOPPLER ULTRASOUND OF THE TESTICLES TECHNIQUE: Complete ultrasound examination of the testicles, epididymis, and other scrotal structures was performed. Color and spectral Doppler ultrasound were also utilized to evaluate blood flow to the testicles. COMPARISON:  CT 03/09/2017 . FINDINGS: Right testicle Measurements: 4.9 x 2.5 x 2.8 cm. No mass or microlithiasis visualized. Left testicle Measurements: 4.4 x 2.8 x 3.7 cm. 3 mm cyst. No  microlithiasis visualized. Right epididymis:  Normal in size and appearance. Left epididymis:  Normal in size and appearance. Hydrocele:  Small left hydrocele. Varicocele: None visualized. Left inguinal hernia with herniation of fat appears to be present. No bowel herniation noted . Confirmation with IV and oral contrast enhanced CT of the abdomen pelvis can be obtained. Scrotal wall edema noted. Pulsed Doppler interrogation of both testes demonstrates normal low resistance arterial and venous waveforms bilaterally. IMPRESSION: 1. Left inguinal hernia with herniation of fat. No bowel herniation noted. Confirmation with IV and oral contrast enhanced CT of the abdomen pelvis can be obtained. Small left hydrocele. Scrotal wall edema noted. 2. No evidence of testicular torsion or significant testicular mass. Electronically Signed   By: Maisie Fus  Register   On: 03/12/2017 13:12   Ct Abdomen Pelvis W Contrast  Result Date: 03/12/2017 CLINICAL DATA:  Bilateral testicular swelling. Left inguinal hernia with herniation of fat in on a recent testicular ultrasound. Nausea, vomiting, abdominal pain and diarrhea. Significant alcohol abuse. Followup pancreatitis on recent CT. EXAM: CT ABDOMEN AND PELVIS WITH CONTRAST TECHNIQUE: Multidetector CT imaging  of the abdomen and pelvis was performed using the standard protocol following bolus administration of intravenous contrast. CONTRAST:  100mL ISOVUE-300 IOPAMIDOL (ISOVUE-300) INJECTION 61%, 30mL ISOVUE-300 IOPAMIDOL (ISOVUE-300) INJECTION 61% COMPARISON:  03/09/2017. FINDINGS: Lower chest: Clear lung bases. Hepatobiliary: Minimal diffuse low density of the liver relative to the spleen. Sludge in the gallbladder. Pancreas: Mildly ill-defined margins with improvement and decreased peripancreatic soft tissue stranding involving the neck, body and tail of the pancreas. Increased size and heterogeneity of the pancreatic head with interval extensive peripancreatic edema and soft tissue  stranding. No discrete fluid collections at this time. The fluid extends inferiorly pararenal space on the right. Spleen: Normal in size without focal abnormality. Adrenals/Urinary Tract: Adrenal glands are unremarkable. The previously demonstrated small rounded area of low density in the mid right kidney is not as well visualized today. Otherwise, the kidneys have normal appearances, without renal calculi, focal lesion, or hydronephrosis. Bladder is unremarkable. Stomach/Bowel: Stomach is within normal limits. Appendix appears normal. No evidence of bowel wall thickening, distention, or inflammatory changes. Vascular/Lymphatic: No significant vascular findings are present. No enlarged abdominal or pelvic lymph nodes. Reproductive: Normal sized prostate gland. Other: Small amount of presacral fluid. Bilateral inguinal hernias containing fat. The hernia on the left also contains fluid. Musculoskeletal: Minimal lumbar and lower thoracic spine degenerative changes. IMPRESSION: 1. Interval marked changes of acute pancreatitis involving the pancreatic head without complicating features. 2. Resolved changes of pancreatitis involving the body and tail of the pancreas. 3. The previously demonstrated right kidney low density area is not as well visualized today but still needs MR follow-up as previously recommended. 4. Bilateral inguinal hernias containing fat. There is also fluid in the hernia on the left. 5. Minimal diffuse hepatic steatosis. Electronically Signed   By: Beckie SaltsSteven  Reid M.D.   On: 03/12/2017 21:01   Ct Abdomen Pelvis W Contrast  Result Date: 03/09/2017 CLINICAL DATA:  Evaluate for pancreatitis. EXAM: CT ABDOMEN AND PELVIS WITH CONTRAST TECHNIQUE: Multidetector CT imaging of the abdomen and pelvis was performed using the standard protocol following bolus administration of intravenous contrast. CONTRAST:  100mL ISOVUE-300 IOPAMIDOL (ISOVUE-300) INJECTION 61% COMPARISON:  10/01/2015 and 05/07/2014 FINDINGS:  Lower chest: No acute abnormality. Hepatobiliary: No focal liver abnormality is seen. No gallstones, gallbladder wall thickening, or biliary dilatation. Pancreas: There is no mass or fluid collections associated with the pancreas. Mild edema and peripancreatic fat stranding involves the body and proximal tail of pancreas, image number 20 of series 2. Findings are compatible with mild uncomplicated pancreatitis. Spleen: Normal in size without focal abnormality. Adrenals/Urinary Tract: The left kidney appears normal. There is a indeterminate right kidney lesion which measures 1.2 cm and 50 HU postcontrast, image 24 of series 7. No hydronephrosis or hydroureter. The urinary bladder appears normal. Stomach/Bowel: Stomach is within normal limits. Appendix appears normal. No evidence of bowel wall thickening, distention, or inflammatory changes. Vascular/Lymphatic: No significant vascular findings are present. No enlarged abdominal or pelvic lymph nodes. Reproductive: Prostate is unremarkable. Other: No abdominal wall hernia or abnormality. No abdominopelvic ascites. Musculoskeletal: No acute or significant osseous findings. IMPRESSION: 1. Mild inflammatory changes involving the body and proximal tail of pancreas compatible with uncomplicated pancreatitis. 2. Indeterminate right kidney lesion. This does not seem significantly changed from 05/07/2014. Nevertheless, findings may represent a hyperdense cyst or solid enhancing renal neoplasm. Consider more definitive assessment of this structure with nonemergent MRI of the kidneys. Electronically Signed   By: Signa Kellaylor  Stroud M.D.   On: 03/09/2017 13:48   [place]  Art/ven Flow Abd Pelv Doppler  Result Date: 03/12/2017 CLINICAL DATA:  Scrotal swelling and pain . EXAM: SCROTAL ULTRASOUND DOPPLER ULTRASOUND OF THE TESTICLES TECHNIQUE: Complete ultrasound examination of the testicles, epididymis, and other scrotal structures was performed. Color and spectral Doppler ultrasound were  also utilized to evaluate blood flow to the testicles. COMPARISON:  CT 03/09/2017 . FINDINGS: Right testicle Measurements: 4.9 x 2.5 x 2.8 cm. No mass or microlithiasis visualized. Left testicle Measurements: 4.4 x 2.8 x 3.7 cm. 3 mm cyst. No microlithiasis visualized. Right epididymis:  Normal in size and appearance. Left epididymis:  Normal in size and appearance. Hydrocele:  Small left hydrocele. Varicocele: None visualized. Left inguinal hernia with herniation of fat appears to be present. No bowel herniation noted . Confirmation with IV and oral contrast enhanced CT of the abdomen pelvis can be obtained. Scrotal wall edema noted. Pulsed Doppler interrogation of both testes demonstrates normal low resistance arterial and venous waveforms bilaterally. IMPRESSION: 1. Left inguinal hernia with herniation of fat. No bowel herniation noted. Confirmation with IV and oral contrast enhanced CT of the abdomen pelvis can be obtained. Small left hydrocele. Scrotal wall edema noted. 2. No evidence of testicular torsion or significant testicular mass. Electronically Signed   By: Maisie Fus  Register   On: 03/12/2017 13:12    Microbiology: No results found for this or any previous visit (from the past 240 hour(s)).   Labs: Basic Metabolic Panel:  Recent Labs Lab 03/09/17 1130 03/11/17 0451 03/12/17 0441  NA 134* 133* 134*  K 3.5 3.5 3.2*  CL 99* 102 99*  CO2 25 22 26   GLUCOSE 190* 187* 173*  BUN 14 <5* <5*  CREATININE 0.64 0.65 0.70  CALCIUM 8.8* 8.6* 8.8*   Liver Function Tests:  Recent Labs Lab 03/09/17 1130 03/12/17 0441  AST 35 22  ALT 24 13*  ALKPHOS 80 58  BILITOT 0.8 1.3*  PROT 7.0 7.1  ALBUMIN 3.8 3.5    Recent Labs Lab 03/09/17 1130  LIPASE 34   CBC:  Recent Labs Lab 03/09/17 1130 03/11/17 0451 03/12/17 0441  WBC 5.2 13.5* 10.1  HGB 13.0 12.1* 11.7*  HCT 36.3* 34.1* 32.9*  MCV 87.3 88.1 88.7  PLT 192 124* 121*   CBG:  Recent Labs Lab 03/12/17 0721 03/12/17 1148  03/12/17 1545 03/12/17 2056 03/13/17 0802  GLUCAP 145* 145* 149* 186* 159*   Signed:  Vassie Loll MD.  [facility] 03/13/2017, 11:36 AM

## 2017-03-17 ENCOUNTER — Emergency Department (HOSPITAL_COMMUNITY)
Admission: EM | Admit: 2017-03-17 | Discharge: 2017-03-17 | Disposition: A | Discharge status: Home / Self Care | Payer: Medicaid Other | Attending: Emergency Medicine | Admitting: Emergency Medicine

## 2017-03-17 ENCOUNTER — Encounter (HOSPITAL_COMMUNITY): Payer: Self-pay

## 2017-03-17 ENCOUNTER — Emergency Department (HOSPITAL_COMMUNITY): Payer: Medicaid Other

## 2017-03-17 DIAGNOSIS — Z794 Long term (current) use of insulin: Secondary | ICD-10-CM | POA: Insufficient documentation

## 2017-03-17 DIAGNOSIS — N433 Hydrocele, unspecified: Secondary | ICD-10-CM | POA: Insufficient documentation

## 2017-03-17 DIAGNOSIS — I10 Essential (primary) hypertension: Secondary | ICD-10-CM | POA: Diagnosis not present

## 2017-03-17 DIAGNOSIS — N5089 Other specified disorders of the male genital organs: Secondary | ICD-10-CM | POA: Insufficient documentation

## 2017-03-17 DIAGNOSIS — F17200 Nicotine dependence, unspecified, uncomplicated: Secondary | ICD-10-CM | POA: Insufficient documentation

## 2017-03-17 DIAGNOSIS — Z79899 Other long term (current) drug therapy: Secondary | ICD-10-CM | POA: Insufficient documentation

## 2017-03-17 DIAGNOSIS — N5082 Scrotal pain: Secondary | ICD-10-CM | POA: Diagnosis present

## 2017-03-17 DIAGNOSIS — E119 Type 2 diabetes mellitus without complications: Secondary | ICD-10-CM | POA: Diagnosis not present

## 2017-03-17 LAB — URINALYSIS, ROUTINE W REFLEX MICROSCOPIC
BILIRUBIN URINE: NEGATIVE
Bacteria, UA: NONE SEEN
HGB URINE DIPSTICK: NEGATIVE
Ketones, ur: NEGATIVE mg/dL
Leukocytes, UA: NEGATIVE
NITRITE: NEGATIVE
Protein, ur: NEGATIVE mg/dL
SPECIFIC GRAVITY, URINE: 1.017 (ref 1.005–1.030)
Squamous Epithelial / LPF: NONE SEEN
pH: 5 (ref 5.0–8.0)

## 2017-03-17 MED ORDER — HYDROCODONE-ACETAMINOPHEN 5-325 MG PO TABS: 1.0000 | ORAL_TABLET | Freq: Two times a day (BID) | ORAL | 0 refills | Status: DC | PRN

## 2017-03-17 MED ORDER — IBUPROFEN 600 MG PO TABS: 600.0000 mg | ORAL_TABLET | Freq: Four times a day (QID) | ORAL | 0 refills | Status: DC | PRN

## 2017-03-17 MED ORDER — OXYCODONE-ACETAMINOPHEN 5-325 MG PO TABS
1.0000 | ORAL_TABLET | Freq: Once | ORAL | Status: AC
  Administered 2017-03-17: 1 via ORAL
  Filled 2017-03-17: qty 1

## 2017-03-17 MED ORDER — IBUPROFEN 400 MG PO TABS
600.0000 mg | ORAL_TABLET | Freq: Once | ORAL | Status: AC
  Administered 2017-03-17: 17:00:00 600 mg via ORAL
  Filled 2017-03-17: qty 1

## 2017-03-17 NOTE — Discharge Instructions (Signed)
You have a hydrocele -which likely will resolve on it's own. PLEASE READ THE INFORMATION PROVIDED,  Take the pain meds, wear underwear briefs to give support to the scrotum.  Please return to the ER if your symptoms worsen; you have increased pain, fevers, chills, inability to keep any medications dow. Otherwise see the outpatient doctor as requested.

## 2017-03-17 NOTE — ED Provider Notes (Signed)
[facility]-EMERGENCY DEPT Provider Note   CSN: [id number] Arrival date & time: 03/17/17  1210     History   Chief Complaint Chief Complaint  Patient presents with  . Groin Pain    HPI David Doyle is a 46 y.o. male.  HPI Pt with hx of DM, alcohol abuse (sober for 2 months), psychiatric disorder comes in with scrotal pain. Pt  Started having scrotal pain 2 days ago, and the swelling and pain has gotten worse. Pt denies trauma. Patient has no pain with urination, blood in the urine, or frequent urination. Pt also denies any penile discharge or bleeding. No hx of similar complains in the past.   Past Medical History:  Diagnosis Date  . Bipolar 1 disorder (HCC)   . Depression   . Diabetes mellitus without complication (HCC)   . GERD (gastroesophageal reflux disease)   . Hypertension   . Pancreatitis   . Portal vein thrombosis   . PTSD (post-traumatic stress disorder)   . Substance abuse   . Suicidal behavior     Patient Active Problem List   Diagnosis Date Noted  . Inguinal hernia   . Renal cyst   . Type 2 diabetes mellitus without complication (HCC)   . Benign essential HTN   . Depression   . Gastroesophageal reflux disease   . Pancreatitis 03/09/2017  . Heroin dependence (HCC) 03/15/2016  . 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 Opioid type dependence, continuous (HCC) 01/03/2016  . Alcohol dependence (HCC) 01/03/2016  . PTSD (post-traumatic stress disorder) 01/03/2016  . Suicidal ideation 01/03/2016  . Severe recurrent major depression without psychotic features (HCC) 01/03/2016  . BACK PAIN 07/15/2011    Past Surgical History:  Procedure Laterality Date  . ADENOIDECTOMY    . TONSILLECTOMY         Home Medications    Prior to Admission medications   Medication Sig Start Date End Date Taking? Authorizing Provider  atorvastatin (LIPITOR) 40 MG tablet Take 1 tablet (40 mg  total) by mouth daily. For high cholesterol 03/13/17  Yes Vassie Loll, MD  docusate sodium (COLACE) 100 MG capsule Take 1 capsule (100 mg total) by mouth 2 (two) times daily. 03/13/17  Yes Vassie Loll, MD  insulin detemir (LEVEMIR) 100 UNIT/ML injection Inject 0.1 mLs (10 Units total) into the skin at bedtime. Patient taking differently: Inject 22 Units into the skin at bedtime.  03/18/16  Yes Thermon Leyland, NP  losartan (COZAAR) 50 MG tablet Take 1 tablet (50 mg total) by mouth daily. 03/13/17  Yes Vassie Loll, MD  metFORMIN (GLUCOPHAGE) 500 MG tablet Take 1 tablet (500 mg total) by mouth 2 (two) times daily with a meal. For diabetes management 03/13/17  Yes Vassie Loll, MD  pantoprazole (PROTONIX) 40 MG tablet Take 1 tablet (40 mg total) by mouth 2 (two) times daily. For acid reflux Patient taking differently: Take 40 mg by mouth daily. For acid reflux 03/13/17  Yes Vassie Loll, MD  polyethylene glycol [facility]) packet Take 17 g by mouth daily. 03/13/17  Yes Vassie Loll, MD  prazosin (MINIPRESS) 2 MG capsule Take 1 capsule (2 mg total) by mouth at bedtime. 03/13/17  Yes Vassie Loll, MD  QUEtiapine (SEROQUEL) 100 MG tablet Take 1 tablet (100 mg total) by mouth at bedtime. 03/13/17  Yes Vassie Loll, MD  sertraline (ZOLOFT) 100 MG tablet Take 1.5 tablets (150 mg total) by mouth daily. For depression 03/13/17  Yes Vassie Lollarlos Madera, MD  thiamine 100 MG tablet Take 1 tablet (100 mg total) by mouth daily. 03/14/17  Yes Vassie Lollarlos Madera, MD  HYDROcodone-acetaminophen (NORCO/VICODIN) 5-325 MG tablet Take 1 tablet by mouth every 12 (twelve) hours as needed. 03/17/17   Derwood KaplanAnkit Jaelynne Hockley, MD  ibuprofen (ADVIL,MOTRIN) 600 MG tablet Take 1 tablet (600 mg total) by mouth every 6 (six) hours as needed. 03/17/17   Derwood KaplanAnkit Tiahna Cure, MD  oxyCODONE-acetaminophen (PERCOCET/ROXICET) 5-325 MG tablet Take 1 tablet by mouth every 6 (six) hours as needed for severe pain. 03/13/17   Vassie Lollarlos Madera, MD    Family History History  reviewed. No pertinent family history.  Social History Social History  Substance Use Topics  . Smoking status: Current Every Day Smoker    Packs/day: 2.00  . Smokeless tobacco: Never Used  . Alcohol use Yes     Comment: occasionally     Allergies   Patient has no known allergies.   Review of Systems Review of Systems   ROS 10 Systems reviewed and are negative for acute change except as noted in the HPI.     Physical Exam Updated Vital Signs BP 131/87   Pulse (!) 107   Temp 98.2 F (36.8 C) (Oral)   Resp 20   Ht 5\' 10"  (1.778 m)   Wt 190 lb (86.2 kg)   SpO2 96%   BMI 27.26 kg/m   Physical Exam  Constitutional: He is oriented to person, place, and time. He appears well-developed.  HENT:  Head: Normocephalic and atraumatic.  Eyes: Conjunctivae and EOM are normal. Pupils are equal, round, and reactive to light.  Neck: Normal range of motion. Neck supple.  Cardiovascular: Normal rate and regular rhythm.   Pulmonary/Chest: Effort normal and breath sounds normal.  Abdominal: Soft. Bowel sounds are normal. He exhibits no distension and no mass. There is no tenderness. There is no rebound and no guarding.  Genitourinary: Penis normal.  Genitourinary Comments: Bilateral scrotal swelling, with no signs of inguinal hernia.   Musculoskeletal: He exhibits no deformity.  Neurological: He is alert and oriented to person, place, and time.  Skin: Skin is warm.  Nursing note and vitals reviewed.    ED Treatments / Results  Labs (all labs ordered are listed, but only abnormal results are displayed) Labs Reviewed  URINALYSIS, ROUTINE W REFLEX MICROSCOPIC - Abnormal; Notable for the following:       Result Value   Glucose, UA >=500 (*)    All other components within normal limits  URINE CULTURE    EKG  EKG Interpretation None       Radiology [place] Scrotum  Result Date: 03/17/2017 CLINICAL DATA:  Scrotal and suprapubic swelling for 1 week. EXAM: SCROTAL ULTRASOUND  DOPPLER ULTRASOUND OF THE TESTICLES TECHNIQUE: Complete ultrasound examination of the testicles, epididymis, and other scrotal structures was performed. Color and spectral Doppler ultrasound were also utilized to evaluate blood flow to the testicles. COMPARISON:  None. FINDINGS: Right testicle Measurements: 4.7 x 2.7 x 3.5 cm. No mass or microlithiasis visualized. Left testicle Measurements: 4.5 x 2.4 x 3.1 cm. No mass or microlithiasis visualized. Tiny 4 mm peripheral cyst is seen in the lower pole, consistent with a tunica albuginea cyst. Right epididymis:  Normal in size and appearance. Left epididymis:  Normal in size and appearance. Hydrocele: None visualized within the scrotum. A complex ovoid fluid collection is seen in the left groin along the course of the inguinal canal which measures 6.0 x 1.1 x 2.3 cm. This  is consistent with an encysted hydrocele. Varicocele:  None visualized. Pulsed Doppler interrogation of both testes demonstrates normal low resistance arterial and venous waveforms bilaterally. IMPRESSION: No evidence of testicular torsion or neoplasm. Complex ovoid fluid collection along the left inguinal canal, consistent with spermatic cord hydrocele . Electronically Signed   By: Myles Rosenthal M.D.   On: 03/17/2017 15:07   [place] Art/ven Flow Abd Pelv Doppler  Result Date: 03/17/2017 CLINICAL DATA:  Scrotal and suprapubic swelling for 1 week. EXAM: SCROTAL ULTRASOUND DOPPLER ULTRASOUND OF THE TESTICLES TECHNIQUE: Complete ultrasound examination of the testicles, epididymis, and other scrotal structures was performed. Color and spectral Doppler ultrasound were also utilized to evaluate blood flow to the testicles. COMPARISON:  None. FINDINGS: Right testicle Measurements: 4.7 x 2.7 x 3.5 cm. No mass or microlithiasis visualized. Left testicle Measurements: 4.5 x 2.4 x 3.1 cm. No mass or microlithiasis visualized. Tiny 4 mm peripheral cyst is seen in the lower pole, consistent with a tunica albuginea  cyst. Right epididymis:  Normal in size and appearance. Left epididymis:  Normal in size and appearance. Hydrocele: None visualized within the scrotum. A complex ovoid fluid collection is seen in the left groin along the course of the inguinal canal which measures 6.0 x 1.1 x 2.3 cm. This is consistent with an encysted hydrocele. Varicocele:  None visualized. Pulsed Doppler interrogation of both testes demonstrates normal low resistance arterial and venous waveforms bilaterally. IMPRESSION: No evidence of testicular torsion or neoplasm. Complex ovoid fluid collection along the left inguinal canal, consistent with spermatic cord hydrocele . Electronically Signed   By: Myles Rosenthal M.D.   On: 03/17/2017 15:07    Procedures Procedures (including critical care time)  Medications Ordered in ED Medications  oxyCODONE-acetaminophen (PERCOCET/ROXICET) 5-325 MG per tablet 1 tablet (1 tablet Oral Given 03/17/17 1640)  ibuprofen (ADVIL,MOTRIN) tablet 600 mg (600 mg Oral Given 03/17/17 1640)     Initial Impression / Assessment and Plan / ED Course  I have reviewed the triage vital signs and the nursing notes.  Pertinent labs & imaging results that were available during my care of the patient were reviewed by me and considered in my medical decision making (see chart for details).     Pt has scrotal swelling and pain. Pt denies trauma. No symptoms consistent with infection. [place] ordered - and shows hydrocele. No torsion. Stable for d/c.  Final Clinical Impressions(s) / ED Diagnoses   Final diagnoses:  Swelling, scrotum  Hydrocele of testis    New Prescriptions New Prescriptions   HYDROCODONE-ACETAMINOPHEN (NORCO/VICODIN) 5-325 MG TABLET    Take 1 tablet by mouth every 12 (twelve) hours as needed.   IBUPROFEN (ADVIL,MOTRIN) 600 MG TABLET    Take 1 tablet (600 mg total) by mouth every 6 (six) hours as needed.     Derwood Kaplan, MD 03/17/17 [phone]

## 2017-03-17 NOTE — ED Triage Notes (Signed)
Pt states bilateral testicle swelling. He reports the pain radiates into his suprapubic region as well.

## 2017-03-18 LAB — URINE CULTURE: Culture: NO GROWTH

## 2017-03-23 ENCOUNTER — Encounter (HOSPITAL_COMMUNITY): Payer: Self-pay | Admitting: Emergency Medicine

## 2017-03-23 ENCOUNTER — Emergency Department (HOSPITAL_COMMUNITY)
Admission: EM | Admit: 2017-03-23 | Discharge: 2017-03-23 | Disposition: A | Discharge status: Home / Self Care | Payer: Medicaid Other | Attending: Emergency Medicine | Admitting: Emergency Medicine

## 2017-03-23 DIAGNOSIS — Z79899 Other long term (current) drug therapy: Secondary | ICD-10-CM | POA: Diagnosis not present

## 2017-03-23 DIAGNOSIS — I10 Essential (primary) hypertension: Secondary | ICD-10-CM | POA: Diagnosis not present

## 2017-03-23 DIAGNOSIS — Z794 Long term (current) use of insulin: Secondary | ICD-10-CM | POA: Diagnosis not present

## 2017-03-23 DIAGNOSIS — E119 Type 2 diabetes mellitus without complications: Secondary | ICD-10-CM | POA: Diagnosis not present

## 2017-03-23 DIAGNOSIS — F17200 Nicotine dependence, unspecified, uncomplicated: Secondary | ICD-10-CM | POA: Diagnosis not present

## 2017-03-23 DIAGNOSIS — R1010 Upper abdominal pain, unspecified: Secondary | ICD-10-CM

## 2017-03-23 DIAGNOSIS — R1013 Epigastric pain: Secondary | ICD-10-CM | POA: Diagnosis present

## 2017-03-23 LAB — URINALYSIS, ROUTINE W REFLEX MICROSCOPIC
Bilirubin Urine: NEGATIVE
GLUCOSE, UA: 150 mg/dL — AB
Hgb urine dipstick: NEGATIVE
Ketones, ur: 5 mg/dL — AB
Leukocytes, UA: NEGATIVE
NITRITE: NEGATIVE
PH: 5 (ref 5.0–8.0)
Protein, ur: 100 mg/dL — AB
Specific Gravity, Urine: 1.024 (ref 1.005–1.030)

## 2017-03-23 LAB — COMPREHENSIVE METABOLIC PANEL
ALBUMIN: 4.6 g/dL (ref 3.5–5.0)
ALK PHOS: 87 U/L (ref 38–126)
ALT: 18 U/L (ref 17–63)
ANION GAP: 12 (ref 5–15)
AST: 26 U/L (ref 15–41)
BILIRUBIN TOTAL: 0.5 mg/dL (ref 0.3–1.2)
BUN: 10 mg/dL (ref 6–20)
CALCIUM: 9.6 mg/dL (ref 8.9–10.3)
CO2: 28 mmol/L (ref 22–32)
CREATININE: 0.92 mg/dL (ref 0.61–1.24)
Chloride: 97 mmol/L — ABNORMAL LOW (ref 101–111)
GFR calc Af Amer: >60 mL/min (ref >60)
GFR calc non Af Amer: >60 mL/min (ref >60)
GLUCOSE: 167 mg/dL — AB (ref 65–99)
Potassium: 4.1 mmol/L (ref 3.5–5.1)
Sodium: 137 mmol/L (ref 135–145)
Total Protein: 8.7 g/dL — ABNORMAL HIGH (ref 6.5–8.1)

## 2017-03-23 LAB — CBC
HCT: 43 % (ref 39.0–52.0)
Hemoglobin: 15.3 g/dL (ref 13.0–17.0)
MCH: 31.9 pg (ref 26.0–34.0)
MCHC: 35.6 g/dL (ref 30.0–36.0)
MCV: 89.6 fL (ref 78.0–100.0)
PLATELETS: 367 10*3/uL (ref 150–400)
RBC: 4.8 MIL/uL (ref 4.22–5.81)
RDW: 14.8 % (ref 11.5–15.5)
WBC: 9.3 10*3/uL (ref 4.0–10.5)

## 2017-03-23 LAB — LIPASE, BLOOD: Lipase: 31 U/L (ref 11–51)

## 2017-03-23 MED ORDER — MORPHINE SULFATE (PF) 4 MG/ML IV SOLN
4.0000 mg | Freq: Once | INTRAVENOUS | Status: AC
  Administered 2017-03-23: 4 mg via INTRAVENOUS
  Filled 2017-03-23: qty 1

## 2017-03-23 MED ORDER — SODIUM CHLORIDE 0.9 % IV BOLUS (SEPSIS)
2000.0000 mL | Freq: Once | INTRAVENOUS | Status: AC
  Administered 2017-03-23: 2000 mL via INTRAVENOUS

## 2017-03-23 MED ORDER — FAMOTIDINE IN NACL 20-0.9 MG/50ML-% IV SOLN
20.0000 mg | Freq: Once | INTRAVENOUS | Status: AC
  Administered 2017-03-23: 20 mg via INTRAVENOUS
  Filled 2017-03-23: qty 50

## 2017-03-23 MED ORDER — DIPHENHYDRAMINE HCL 50 MG/ML IJ SOLN
12.5000 mg | Freq: Once | INTRAMUSCULAR | Status: AC
  Administered 2017-03-23: 12.5 mg via INTRAVENOUS
  Filled 2017-03-23: qty 1

## 2017-03-23 MED ORDER — SODIUM CHLORIDE 0.9 % IV SOLN: INTRAVENOUS | Status: DC

## 2017-03-23 MED ORDER — METOCLOPRAMIDE HCL 5 MG/ML IJ SOLN
10.0000 mg | Freq: Once | INTRAMUSCULAR | Status: AC
  Administered 2017-03-23: 10 mg via INTRAVENOUS
  Filled 2017-03-23: qty 2

## 2017-03-23 MED ORDER — SUCRALFATE 1 G PO TABS: 1.0000 g | ORAL_TABLET | Freq: Four times a day (QID) | ORAL | 0 refills | Status: AC

## 2017-03-23 NOTE — ED Notes (Signed)
PT DISCHARGED. INSTRUCTIONS AND PRESCRIPTION GIVEN. AAOX4. PT IN NO APPARENT DISTRESS OR PAIN. THE OPPORTUNITY TO ASK QUESTIONS WAS PROVIDED. 

## 2017-03-23 NOTE — ED Provider Notes (Signed)
[facility]-EMERGENCY DEPT Provider Note   CSN: [id number] Arrival date & time: 03/23/17  1222     History   Chief Complaint Chief Complaint  Patient presents with  . Abdominal Pain    HPI David Doyle is a 46 y.o. male.  46 year old male presents with abdominal discomfort localized to his epigastric region.He has vomited twice today. Pain does radiate to his back. Does have a history of alcohol abuse and drank 2 beers yesterday. Symptoms began shortly after that. Denies any fever or chills. No urinary symptoms. He has had nonbilious vomiting and some watery diarrhea without blood or darkness to it. Symptoms are worse after he eats. Does take a PPI and states he's been compliant.      Past Medical History:  Diagnosis Date  . Bipolar 1 disorder (HCC)   . Depression   . Diabetes mellitus without complication (HCC)   . GERD (gastroesophageal reflux disease)   . Hypertension   . Pancreatitis   . Portal vein thrombosis   . PTSD (post-traumatic stress disorder)   . Substance abuse   . Suicidal behavior     Patient Active Problem List   Diagnosis Date Noted  . Inguinal hernia   . Renal cyst   . Type 2 diabetes mellitus without complication (HCC)   . Benign essential HTN   . Depression   . Gastroesophageal reflux disease   . Pancreatitis 03/09/2017  . Heroin dependence (HCC) 03/15/2016  . 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 Opioid type dependence, continuous (HCC) 01/03/2016  . Alcohol dependence (HCC) 01/03/2016  . PTSD (post-traumatic stress disorder) 01/03/2016  . Suicidal ideation 01/03/2016  . Severe recurrent major depression without psychotic features (HCC) 01/03/2016  . BACK PAIN 07/15/2011    Past Surgical History:  Procedure Laterality Date  . ADENOIDECTOMY    . TONSILLECTOMY         Home Medications    Prior to Admission medications   Medication Sig Start Date End  Date Taking? Authorizing Provider  atorvastatin (LIPITOR) 40 MG tablet Take 1 tablet (40 mg total) by mouth daily. For high cholesterol 03/13/17   Vassie Loll, MD  docusate sodium (COLACE) 100 MG capsule Take 1 capsule (100 mg total) by mouth 2 (two) times daily. 03/13/17   Vassie Loll, MD  HYDROcodone-acetaminophen (NORCO/VICODIN) 5-325 MG tablet Take 1 tablet by mouth every 12 (twelve) hours as needed. 03/17/17   Derwood Kaplan, MD  ibuprofen (ADVIL,MOTRIN) 600 MG tablet Take 1 tablet (600 mg total) by mouth every 6 (six) hours as needed. 03/17/17   Derwood Kaplan, MD  insulin detemir (LEVEMIR) 100 UNIT/ML injection Inject 0.1 mLs (10 Units total) into the skin at bedtime. Patient taking differently: Inject 22 Units into the skin at bedtime.  03/18/16   Thermon Leyland, NP  losartan (COZAAR) 50 MG tablet Take 1 tablet (50 mg total) by mouth daily. 03/13/17   Vassie Loll, MD  metFORMIN (GLUCOPHAGE) 500 MG tablet Take 1 tablet (500 mg total) by mouth 2 (two) times daily with a meal. For diabetes management 03/13/17   Vassie Loll, MD  oxyCODONE-acetaminophen (PERCOCET/ROXICET) 5-325 MG tablet Take 1 tablet by mouth every 6 (six) hours as needed for severe pain. 03/13/17   Vassie Loll, MD  pantoprazole (PROTONIX) 40 MG tablet Take 1 tablet (40 mg total) by mouth 2 (two) times daily. For acid reflux Patient taking differently: Take 40 mg by mouth daily. For acid  reflux 03/13/17   Vassie Loll, MD  polyethylene glycol [facility]) packet Take 17 g by mouth daily. 03/13/17   Vassie Loll, MD  prazosin (MINIPRESS) 2 MG capsule Take 1 capsule (2 mg total) by mouth at bedtime. 03/13/17   Vassie Loll, MD  QUEtiapine (SEROQUEL) 100 MG tablet Take 1 tablet (100 mg total) by mouth at bedtime. 03/13/17   Vassie Loll, MD  sertraline (ZOLOFT) 100 MG tablet Take 1.5 tablets (150 mg total) by mouth daily. For depression 03/13/17   Vassie Loll, MD  thiamine 100 MG tablet Take 1 tablet (100 mg total) by mouth daily.  03/14/17   Vassie Loll, MD    Family History No family history on file.  Social History Social History  Substance Use Topics  . Smoking status: Current Every Day Smoker    Packs/day: 2.00  . Smokeless tobacco: Never Used  . Alcohol use Yes     Comment: occasionally     Allergies   Patient has no known allergies.   Review of Systems Review of Systems  All other systems reviewed and are negative.    Physical Exam Updated Vital Signs BP (!) 138/102 (BP Location: Left Arm)   Pulse (!) 109   Temp 98.8 F (37.1 C) (Oral)   Resp 20   Wt 86.8 kg   SpO2 100%   BMI 27.46 kg/m   Physical Exam  Constitutional: He is oriented to person, place, and time. He appears well-developed and well-nourished.  Non-toxic appearance. No distress.  HENT:  Head: Normocephalic and atraumatic.  Eyes: Conjunctivae, EOM and lids are normal. Pupils are equal, round, and reactive to light.  Neck: Normal range of motion. Neck supple. No tracheal deviation present. No thyroid mass present.  Cardiovascular: Normal rate, regular rhythm and normal heart sounds.  Exam reveals no gallop.   No murmur heard. Pulmonary/Chest: Effort normal and breath sounds normal. No stridor. No respiratory distress. He has no decreased breath sounds. He has no wheezes. He has no rhonchi. He has no rales.  Abdominal: Soft. Normal appearance and bowel sounds are normal. He exhibits no distension. There is no tenderness. There is no rigidity, no rebound, no guarding and no CVA tenderness.    Musculoskeletal: Normal range of motion. He exhibits no edema or tenderness.  Neurological: He is alert and oriented to person, place, and time. He has normal strength. No cranial nerve deficit or sensory deficit. GCS eye subscore is 4. GCS verbal subscore is 5. GCS motor subscore is 6.  Skin: Skin is warm and dry. No abrasion and no rash noted.  Psychiatric: He has a normal mood and affect. His speech is normal and behavior is normal.   Nursing note and vitals reviewed.    ED Treatments / Results  Labs (all labs ordered are listed, but only abnormal results are displayed) Labs Reviewed  COMPREHENSIVE METABOLIC PANEL - Abnormal; Notable for the following:       Result Value   Chloride 97 (*)    Glucose, Bld 167 (*)    Total Protein 8.7 (*)    All other components within normal limits  URINALYSIS, ROUTINE W REFLEX MICROSCOPIC - Abnormal; Notable for the following:    APPearance HAZY (*)    Glucose, UA 150 (*)    Ketones, ur 5 (*)    Protein, ur 100 (*)    Bacteria, UA RARE (*)    Squamous Epithelial / LPF 0-5 (*)    All other components within normal limits  LIPASE, BLOOD  CBC    EKG  EKG Interpretation None       Radiology No results found.  Procedures Procedures (including critical care time)  Medications Ordered in ED Medications  sodium chloride 0.9 % bolus 2,000 mL (not administered)  0.9 %  sodium chloride infusion (not administered)  metoCLOPramide (REGLAN) injection 10 mg (not administered)  diphenhydrAMINE (BENADRYL) injection 12.5 mg (not administered)  famotidine (PEPCID) IVPB 20 mg premix (not administered)     Initial Impression / Assessment and Plan / ED Course  I have reviewed the triage vital signs and the nursing notes.  Pertinent labs & imaging results that were available during my care of the patient were reviewed by me and considered in my medical decision making (see chart for details).     Patient dedicated with IV fluids and pain medicine feels better. Repeat abdominal exam remains nonsurgical. Suspect gastritis from chronic alcohol use. Will discharge home  Final Clinical Impressions(s) / ED Diagnoses   Final diagnoses:  None    New Prescriptions New Prescriptions   No medications on file     Lorre Nick, MD 03/23/17 [id number]

## 2017-03-23 NOTE — ED Triage Notes (Signed)
Pt verbalizes abdominal pain with associated n/v/d onset yesterday.

## 2017-03-25 ENCOUNTER — Emergency Department (HOSPITAL_COMMUNITY)
Admission: EM | Admit: 2017-03-25 | Discharge: 2017-03-25 | Discharge status: Against Medical Advice | Payer: Medicaid Other | Attending: Emergency Medicine | Admitting: Emergency Medicine

## 2017-03-25 DIAGNOSIS — F10920 Alcohol use, unspecified with intoxication, uncomplicated: Secondary | ICD-10-CM

## 2017-03-25 DIAGNOSIS — E119 Type 2 diabetes mellitus without complications: Secondary | ICD-10-CM | POA: Insufficient documentation

## 2017-03-25 DIAGNOSIS — F17200 Nicotine dependence, unspecified, uncomplicated: Secondary | ICD-10-CM | POA: Insufficient documentation

## 2017-03-25 DIAGNOSIS — Z794 Long term (current) use of insulin: Secondary | ICD-10-CM | POA: Diagnosis not present

## 2017-03-25 DIAGNOSIS — F10129 Alcohol abuse with intoxication, unspecified: Secondary | ICD-10-CM | POA: Diagnosis present

## 2017-03-25 DIAGNOSIS — F10120 Alcohol abuse with intoxication, uncomplicated: Secondary | ICD-10-CM | POA: Insufficient documentation

## 2017-03-25 DIAGNOSIS — I10 Essential (primary) hypertension: Secondary | ICD-10-CM | POA: Diagnosis not present

## 2017-03-25 LAB — COMPREHENSIVE METABOLIC PANEL
ALK PHOS: 79 U/L (ref 38–126)
ALT: 20 U/L (ref 17–63)
AST: 26 U/L (ref 15–41)
Albumin: 4 g/dL (ref 3.5–5.0)
Anion gap: 13 (ref 5–15)
BILIRUBIN TOTAL: 0.4 mg/dL (ref 0.3–1.2)
BUN: 11 mg/dL (ref 6–20)
CHLORIDE: 101 mmol/L (ref 101–111)
CO2: 27 mmol/L (ref 22–32)
CREATININE: 0.67 mg/dL (ref 0.61–1.24)
Calcium: 8.9 mg/dL (ref 8.9–10.3)
Glucose, Bld: 233 mg/dL — ABNORMAL HIGH (ref 65–99)
POTASSIUM: 3.6 mmol/L (ref 3.5–5.1)
Sodium: 141 mmol/L (ref 135–145)
TOTAL PROTEIN: 7.9 g/dL (ref 6.5–8.1)

## 2017-03-25 LAB — CBG MONITORING, ED: GLUCOSE-CAPILLARY: 256 mg/dL — AB (ref 65–99)

## 2017-03-25 LAB — CBC WITH DIFFERENTIAL/PLATELET
BASOS ABS: 0 10*3/uL (ref 0.0–0.1)
Basophils Relative: 0 %
Eosinophils Absolute: 0.1 10*3/uL (ref 0.0–0.7)
Eosinophils Relative: 2 %
HCT: 40.1 % (ref 39.0–52.0)
HEMOGLOBIN: 14 g/dL (ref 13.0–17.0)
Lymphocytes Relative: 47 %
Lymphs Abs: 2.7 10*3/uL (ref 0.7–4.0)
MCH: 31.6 pg (ref 26.0–34.0)
MCHC: 34.9 g/dL (ref 30.0–36.0)
MCV: 90.5 fL (ref 78.0–100.0)
Monocytes Absolute: 0.3 10*3/uL (ref 0.1–1.0)
Monocytes Relative: 5 %
NEUTROS PCT: 46 %
Neutro Abs: 2.7 10*3/uL (ref 1.7–7.7)
PLATELETS: 323 10*3/uL (ref 150–400)
RBC: 4.43 MIL/uL (ref 4.22–5.81)
RDW: 14.5 % (ref 11.5–15.5)
WBC: 5.8 10*3/uL (ref 4.0–10.5)

## 2017-03-25 LAB — LIPASE, BLOOD: LIPASE: 36 U/L (ref 11–51)

## 2017-03-25 LAB — ETHANOL: Alcohol, Ethyl (B): 387 mg/dL (ref <5)

## 2017-03-25 MED ORDER — NALOXONE HCL 0.4 MG/ML IJ SOLN
0.4000 mg | Freq: Once | INTRAMUSCULAR | Status: AC
  Administered 2017-03-25: 0.4 mg via INTRAVENOUS
  Filled 2017-03-25: qty 1

## 2017-03-25 MED ORDER — SODIUM CHLORIDE 0.9 % IV SOLN
INTRAVENOUS | Status: DC
  Administered 2017-03-25: 16:00:00 via INTRAVENOUS

## 2017-03-25 MED ORDER — SODIUM CHLORIDE 0.9 % IV SOLN: INTRAVENOUS | Status: DC

## 2017-03-25 MED ORDER — SODIUM CHLORIDE 0.9 % IV BOLUS (SEPSIS)
250.0000 mL | Freq: Once | INTRAVENOUS | Status: AC
  Administered 2017-03-25: 250 mL via INTRAVENOUS

## 2017-03-25 MED ORDER — ONDANSETRON HCL 4 MG/2ML IJ SOLN
4.0000 mg | Freq: Once | INTRAMUSCULAR | Status: AC
  Administered 2017-03-25: 4 mg via INTRAVENOUS
  Filled 2017-03-25: qty 2
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
## 2017-03-25 NOTE — Discharge Instructions (Signed)
Patient woke up and just left. Technically and elopement.

## 2017-03-25 NOTE — ED Notes (Signed)
Bed: ZO10 Expected date:  Expected time:  Means of arrival:  Comments: Hold for ETOH

## 2017-03-25 NOTE — ED Notes (Signed)
This RN called into room after patient awoke and pulled IV out. Pt questioning "where's my liquor?" Pt did not want to wait on MD and insisted on leaving. MD notified.

## 2017-04-22 ENCOUNTER — Encounter (HOSPITAL_COMMUNITY): Payer: Self-pay

## 2017-04-22 ENCOUNTER — Emergency Department (HOSPITAL_COMMUNITY)
Admission: EM | Admit: 2017-04-22 | Discharge: 2017-04-22 | Disposition: A | Discharge status: Home / Self Care | Payer: Medicaid Other | Attending: Emergency Medicine | Admitting: Emergency Medicine

## 2017-04-22 DIAGNOSIS — R1110 Vomiting, unspecified: Secondary | ICD-10-CM | POA: Diagnosis present

## 2017-04-22 DIAGNOSIS — R1012 Left upper quadrant pain: Secondary | ICD-10-CM

## 2017-04-22 DIAGNOSIS — Z794 Long term (current) use of insulin: Secondary | ICD-10-CM | POA: Insufficient documentation

## 2017-04-22 DIAGNOSIS — I10 Essential (primary) hypertension: Secondary | ICD-10-CM | POA: Diagnosis not present

## 2017-04-22 DIAGNOSIS — K2920 Alcoholic gastritis without bleeding: Secondary | ICD-10-CM | POA: Diagnosis not present

## 2017-04-22 DIAGNOSIS — F17200 Nicotine dependence, unspecified, uncomplicated: Secondary | ICD-10-CM | POA: Insufficient documentation

## 2017-04-22 DIAGNOSIS — Z79899 Other long term (current) drug therapy: Secondary | ICD-10-CM | POA: Diagnosis not present

## 2017-04-22 DIAGNOSIS — E119 Type 2 diabetes mellitus without complications: Secondary | ICD-10-CM | POA: Insufficient documentation

## 2017-04-22 DIAGNOSIS — E871 Hypo-osmolality and hyponatremia: Secondary | ICD-10-CM | POA: Diagnosis not present

## 2017-04-22 LAB — URINALYSIS, ROUTINE W REFLEX MICROSCOPIC
BACTERIA UA: NONE SEEN
BILIRUBIN URINE: NEGATIVE
Glucose, UA: >=500 mg/dL — AB
Hgb urine dipstick: NEGATIVE
KETONES UR: NEGATIVE mg/dL
LEUKOCYTES UA: NEGATIVE
NITRITE: NEGATIVE
PH: 6 (ref 5.0–8.0)
PROTEIN: NEGATIVE mg/dL
SQUAMOUS EPITHELIAL / LPF: NONE SEEN
Specific Gravity, Urine: 1.009 (ref 1.005–1.030)

## 2017-04-22 LAB — COMPREHENSIVE METABOLIC PANEL
ALT: 50 U/L (ref 17–63)
ANION GAP: 12 (ref 5–15)
AST: 250 U/L — ABNORMAL HIGH (ref 15–41)
Albumin: 3.8 g/dL (ref 3.5–5.0)
Alkaline Phosphatase: 202 U/L — ABNORMAL HIGH (ref 38–126)
BILIRUBIN TOTAL: 1.3 mg/dL — AB (ref 0.3–1.2)
BUN: <5 mg/dL — ABNORMAL LOW (ref 6–20)
CHLORIDE: 95 mmol/L — AB (ref 101–111)
CO2: 23 mmol/L (ref 22–32)
Calcium: 9.2 mg/dL (ref 8.9–10.3)
Creatinine, Ser: 0.85 mg/dL (ref 0.61–1.24)
Glucose, Bld: 244 mg/dL — ABNORMAL HIGH (ref 65–99)
POTASSIUM: 3.3 mmol/L — AB (ref 3.5–5.1)
Sodium: 130 mmol/L — ABNORMAL LOW (ref 135–145)
TOTAL PROTEIN: 7.2 g/dL (ref 6.5–8.1)

## 2017-04-22 LAB — CBC
HEMATOCRIT: 41.4 % (ref 39.0–52.0)
HEMOGLOBIN: 15.1 g/dL (ref 13.0–17.0)
MCH: 32.3 pg (ref 26.0–34.0)
MCHC: 36.5 g/dL — ABNORMAL HIGH (ref 30.0–36.0)
MCV: 88.7 fL (ref 78.0–100.0)
Platelets: 200 10*3/uL (ref 150–400)
RBC: 4.67 MIL/uL (ref 4.22–5.81)
RDW: 14.8 % (ref 11.5–15.5)
WBC: 7.4 10*3/uL (ref 4.0–10.5)

## 2017-04-22 LAB — ETHANOL: Alcohol, Ethyl (B): <5 mg/dL (ref <5)

## 2017-04-22 LAB — I-STAT TROPONIN, ED: Troponin i, poc: 0 ng/mL (ref 0.00–0.08)

## 2017-04-22 LAB — LIPASE, BLOOD: LIPASE: 30 U/L (ref 11–51)

## 2017-04-22 MED ORDER — SODIUM CHLORIDE 0.9 % IV BOLUS (SEPSIS)
1000.0000 mL | Freq: Once | INTRAVENOUS | Status: AC
  Administered 2017-04-22: 1000 mL via INTRAVENOUS

## 2017-04-22 MED ORDER — POTASSIUM CHLORIDE CRYS ER 20 MEQ PO TBCR
40.0000 meq | EXTENDED_RELEASE_TABLET | Freq: Once | ORAL | Status: AC
  Administered 2017-04-22: 40 meq via ORAL
  Filled 2017-04-22: qty 2

## 2017-04-22 MED ORDER — LORAZEPAM 2 MG/ML IJ SOLN
1.0000 mg | Freq: Once | INTRAMUSCULAR | Status: AC
  Administered 2017-04-22: 1 mg via INTRAVENOUS
  Filled 2017-04-22: qty 1

## 2017-04-22 MED ORDER — PROMETHAZINE HCL 25 MG PO TABS: 25.0000 mg | ORAL_TABLET | Freq: Four times a day (QID) | ORAL | 0 refills | Status: DC | PRN

## 2017-04-22 MED ORDER — ONDANSETRON HCL 4 MG/2ML IJ SOLN
4.0000 mg | Freq: Once | INTRAMUSCULAR | Status: AC
  Administered 2017-04-22: 4 mg via INTRAVENOUS
  Filled 2017-04-22: qty 2

## 2017-04-22 MED ORDER — FAMOTIDINE IN NACL 20-0.9 MG/50ML-% IV SOLN
20.0000 mg | Freq: Once | INTRAVENOUS | Status: AC
  Administered 2017-04-22: 20 mg via INTRAVENOUS
  Filled 2017-04-22: qty 50

## 2017-04-22 MED ORDER — OMEPRAZOLE 20 MG PO CPDR: 20.0000 mg | DELAYED_RELEASE_CAPSULE | Freq: Every day | ORAL | 0 refills | Status: AC

## 2017-04-22 MED ORDER — MORPHINE SULFATE (PF) 4 MG/ML IV SOLN
4.0000 mg | Freq: Once | INTRAVENOUS | Status: AC
  Administered 2017-04-22: 4 mg via INTRAVENOUS
  Filled 2017-04-22: qty 1

## 2017-04-22 MED ORDER — GI COCKTAIL ~~LOC~~
30.0000 mL | Freq: Once | ORAL | Status: AC
  Administered 2017-04-22: 30 mL via ORAL
  Filled 2017-04-22: qty 30

## 2017-04-22 NOTE — ED Notes (Signed)
Pt given fluid and sandwich

## 2017-04-22 NOTE — ED Notes (Signed)
Bed: ZO10 Expected date:  Expected time:  Means of arrival:  Comments: EMS/n/v/d flank pain

## 2017-04-22 NOTE — ED Provider Notes (Signed)
[facility]-EMERGENCY DEPT Provider Note   CSN: [id number] Arrival date & time: 04/22/17  0830     History   Chief Complaint Chief Complaint  Patient presents with  . Flank Pain  . Emesis    HPI David Doyle is a 46 y.o. male.  The history is provided by the patient. No language interpreter was used.  Flank Pain   Emesis      David Doyle is a 46 y.o. male who presents to the Emergency Department complaining of abdominal pain. 2 days ago he developed nausea, vomiting, diarrhea. He reports multiple episodes of emesis and diarrhea daily, 3 episodes of both this morning. No hematemesis, melena, hematochezia. Yesterday he developed left upper quadrant abdominal pain that radiates to bilateral flanks. He states that the pain feels as if something exploded inside him. He has mild associated chest pain. No shortness of breath, fevers, dysuria. He drinks alcohol regularly and his last drink was 2 days ago when he had half a beer followed by vomiting and diarrhea. He has a history of pancreatitis but this does not feel like his pancreatitis episodes.  Past Medical History:  Diagnosis Date  . Bipolar 1 disorder (HCC)   . Depression   . Diabetes mellitus without complication (HCC)   . GERD (gastroesophageal reflux disease)   . Hypertension   . Pancreatitis   . Portal vein thrombosis   . PTSD (post-traumatic stress disorder)   . Substance abuse   . Suicidal behavior     Patient Active Problem List   Diagnosis Date Noted  . Inguinal hernia   . Renal cyst   . Type 2 diabetes mellitus without complication (HCC)   . Benign essential HTN   . Depression   . Gastroesophageal reflux disease   . Pancreatitis 03/09/2017  . Heroin dependence (HCC) 03/15/2016  . 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 Opioid type dependence, continuous (HCC) 01/03/2016  . Alcohol dependence (HCC) 01/03/2016  . PTSD  (post-traumatic stress disorder) 01/03/2016  . Suicidal ideation 01/03/2016  . Severe recurrent major depression without psychotic features (HCC) 01/03/2016  . BACK PAIN 07/15/2011    Past Surgical History:  Procedure Laterality Date  . ADENOIDECTOMY    . TONSILLECTOMY         Home Medications    Prior to Admission medications   Medication Sig Start Date End Date Taking? Authorizing Provider  pantoprazole (PROTONIX) 40 MG tablet Take 1 tablet (40 mg total) by mouth 2 (two) times daily. For acid reflux 03/13/17  Yes Vassie Loll, MD  QUEtiapine (SEROQUEL) 100 MG tablet Take 1 tablet (100 mg total) by mouth at bedtime. 03/13/17  Yes Vassie Loll, MD  sucralfate (CARAFATE) 1 g tablet Take 1 tablet (1 g total) by mouth 4 (four) times daily. 03/23/17  Yes Lorre Nick, MD  atorvastatin (LIPITOR) 40 MG tablet Take 1 tablet (40 mg total) by mouth daily. For high cholesterol Patient not taking: Reported on 04/22/2017 03/13/17   Vassie Loll, MD  insulin detemir (LEVEMIR) 100 UNIT/ML injection Inject 0.1 mLs (10 Units total) into the skin at bedtime. Patient not taking: Reported on 04/22/2017 03/18/16   Thermon Leyland, NP  losartan (COZAAR) 50 MG tablet Take 1 tablet (50 mg total) by mouth daily. Patient not taking: Reported on 04/22/2017 03/13/17   Vassie Loll, MD  metFORMIN (GLUCOPHAGE) 500 MG tablet Take 1 tablet (500 mg total) by mouth 2 (two)  times daily with a meal. For diabetes management Patient not taking: Reported on 04/22/2017 03/13/17   Vassie Loll, MD  omeprazole (PRILOSEC) 20 MG capsule Take 1 capsule (20 mg total) by mouth daily. 04/22/17   Tilden Fossa, MD  prazosin (MINIPRESS) 2 MG capsule Take 1 capsule (2 mg total) by mouth at bedtime. Patient not taking: Reported on 04/22/2017 03/13/17   Vassie Loll, MD  promethazine (PHENERGAN) 25 MG tablet Take 1 tablet (25 mg total) by mouth every 6 (six) hours as needed for nausea or vomiting. 04/22/17   Tilden Fossa, MD  sertraline (ZOLOFT)  100 MG tablet Take 1.5 tablets (150 mg total) by mouth daily. For depression Patient not taking: Reported on 04/22/2017 03/13/17   Vassie Loll, MD  thiamine 100 MG tablet Take 1 tablet (100 mg total) by mouth daily. Patient not taking: Reported on 04/22/2017 03/14/17   Vassie Loll, MD    Family History History reviewed. No pertinent family history.  Social History Social History  Substance Use Topics  . Smoking status: Current Every Day Smoker    Packs/day: 2.00  . Smokeless tobacco: Never Used  . Alcohol use Yes     Comment: occasionally     Allergies   Patient has no known allergies.   Review of Systems Review of Systems  Gastrointestinal: Positive for vomiting.  Genitourinary: Positive for flank pain.  All other systems reviewed and are negative.    Physical Exam Updated Vital Signs BP 123/79   Pulse 99   Temp 98.3 F (36.8 C) (Oral)   Resp 12   SpO2 100%   Physical Exam  Constitutional: He is oriented to person, place, and time. He appears well-developed and well-nourished.  HENT:  Head: Normocephalic and atraumatic.  Cardiovascular: Regular rhythm.   No murmur heard. tachycardic  Pulmonary/Chest: Effort normal and breath sounds normal. No respiratory distress.  Abdominal: Soft. There is no rebound and no guarding.  Mild to moderate epigastric and LUQ tenderness  Musculoskeletal: He exhibits no edema or tenderness.  Neurological: He is alert and oriented to person, place, and time.  Skin: Skin is warm and dry.  Psychiatric: He has a normal mood and affect. His behavior is normal.  Nursing note and vitals reviewed.    ED Treatments / Results  Labs (all labs ordered are listed, but only abnormal results are displayed) Labs Reviewed  COMPREHENSIVE METABOLIC PANEL - Abnormal; Notable for the following:       Result Value   Sodium 130 (*)    Potassium 3.3 (*)    Chloride 95 (*)    Glucose, Bld 244 (*)    BUN <5 (*)    AST 250 (*)    Alkaline  Phosphatase 202 (*)    Total Bilirubin 1.3 (*)    All other components within normal limits  CBC - Abnormal; Notable for the following:    MCHC 36.5 (*)    All other components within normal limits  URINALYSIS, ROUTINE W REFLEX MICROSCOPIC - Abnormal; Notable for the following:    Glucose, UA >=500 (*)    All other components within normal limits  LIPASE, BLOOD  ETHANOL  I-STAT TROPOININ, ED    EKG  EKG Interpretation  Date/Time:  Wednesday Apr 22 2017 08:39:13 EDT Ventricular Rate:  99 PR Interval:    QRS Duration: 107 QT Interval:  369 QTC Calculation: 474 R Axis:   84 Text Interpretation:  Sinus rhythm Confirmed by Lincoln Brigham [phone]) on 04/22/2017 10:43:22 AM  Radiology No results found.  Procedures Procedures (including critical care time)  Medications Ordered in ED Medications  sodium chloride 0.9 % bolus 1,000 mL (0 mLs Intravenous Stopped 04/22/17 1108)  ondansetron (ZOFRAN) injection 4 mg (4 mg Intravenous Given 04/22/17 0906)  morphine 4 MG/ML injection 4 mg (4 mg Intravenous Given 04/22/17 0906)  LORazepam (ATIVAN) injection 1 mg (1 mg Intravenous Given 04/22/17 0906)  potassium chloride SA (K-DUR,KLOR-CON) CR tablet 40 mEq (40 mEq Oral Given 04/22/17 1107)  famotidine (PEPCID) IVPB 20 mg premix (0 mg Intravenous Stopped 04/22/17 1225)  gi cocktail (Maalox,Lidocaine,Donnatal) (30 mLs Oral Given 04/22/17 1146)     Initial Impression / Assessment and Plan / ED Course  I have reviewed the triage vital signs and the nursing notes.  Pertinent labs & imaging results that were available during my care of the patient were reviewed by me and considered in my medical decision making (see chart for details).     Patient here for evaluation of abdominal pain, vomiting, diarrhea has a history of alcohol abuse. Following treatment in the emergency department he is feeling improved with no recurrent vomiting. He has no peritoneal findings on examination and no focal right upper  quadrant tenderness. Presentation is not consistent with biliary colic or cholecystitis, pancreatitis. He had imaging of his abdomen and CT scan in the past month with no acute abnormalities. No evidence of incarcerated hernia.  Counseled pt on alcohol abuse and outpatient resources.  Labs demonstrate hyponatremia likely multifactorial secondary to dehydration and alcohol abuse, he was given IV fluid hydration in the department.  Final Clinical Impressions(s) / ED Diagnoses   Final diagnoses:  Acute alcoholic gastritis without hemorrhage  LUQ abdominal pain  Hyponatremia    New Prescriptions New Prescriptions   OMEPRAZOLE (PRILOSEC) 20 MG CAPSULE    Take 1 capsule (20 mg total) by mouth daily.   PROMETHAZINE (PHENERGAN) 25 MG TABLET    Take 1 tablet (25 mg total) by mouth every 6 (six) hours as needed for nausea or vomiting.     Tilden Fossa, MD 04/22/17 1233

## 2017-04-22 NOTE — ED Triage Notes (Signed)
Per EMS, pt is homeless.  Pt c/o n/v/d x 3 days.   Bilateral flank pain x 3 days.  Bile emesis.  Vitals: 145/99, hr 101, 96% ra, cbg 259

## 2017-05-03 ENCOUNTER — Emergency Department (HOSPITAL_COMMUNITY)
Admission: EM | Admit: 2017-05-03 | Discharge: 2017-05-03 | Disposition: A | Discharge status: Home / Self Care | Payer: Medicaid Other | Attending: Emergency Medicine | Admitting: Emergency Medicine

## 2017-05-03 ENCOUNTER — Emergency Department (HOSPITAL_COMMUNITY): Payer: Medicaid Other

## 2017-05-03 ENCOUNTER — Encounter (HOSPITAL_COMMUNITY): Payer: Self-pay | Admitting: Emergency Medicine

## 2017-05-03 DIAGNOSIS — I10 Essential (primary) hypertension: Secondary | ICD-10-CM | POA: Insufficient documentation

## 2017-05-03 DIAGNOSIS — R55 Syncope and collapse: Secondary | ICD-10-CM

## 2017-05-03 DIAGNOSIS — F1010 Alcohol abuse, uncomplicated: Secondary | ICD-10-CM | POA: Diagnosis not present

## 2017-05-03 DIAGNOSIS — E871 Hypo-osmolality and hyponatremia: Secondary | ICD-10-CM | POA: Insufficient documentation

## 2017-05-03 DIAGNOSIS — Z7984 Long term (current) use of oral hypoglycemic drugs: Secondary | ICD-10-CM | POA: Insufficient documentation

## 2017-05-03 DIAGNOSIS — Z79899 Other long term (current) drug therapy: Secondary | ICD-10-CM | POA: Insufficient documentation

## 2017-05-03 DIAGNOSIS — B86 Scabies: Secondary | ICD-10-CM | POA: Diagnosis not present

## 2017-05-03 DIAGNOSIS — E119 Type 2 diabetes mellitus without complications: Secondary | ICD-10-CM | POA: Insufficient documentation

## 2017-05-03 DIAGNOSIS — F17200 Nicotine dependence, unspecified, uncomplicated: Secondary | ICD-10-CM | POA: Diagnosis not present

## 2017-05-03 DIAGNOSIS — R0602 Shortness of breath: Secondary | ICD-10-CM

## 2017-05-03 LAB — COMPREHENSIVE METABOLIC PANEL
ALK PHOS: 124 U/L (ref 38–126)
ALT: 14 U/L — AB (ref 17–63)
AST: 39 U/L (ref 15–41)
Albumin: 3.6 g/dL (ref 3.5–5.0)
Anion gap: 14 (ref 5–15)
BUN: 7 mg/dL (ref 6–20)
CALCIUM: 8.8 mg/dL — AB (ref 8.9–10.3)
CO2: 26 mmol/L (ref 22–32)
CREATININE: 0.66 mg/dL (ref 0.61–1.24)
Chloride: 87 mmol/L — ABNORMAL LOW (ref 101–111)
Glucose, Bld: 172 mg/dL — ABNORMAL HIGH (ref 65–99)
Potassium: 4.7 mmol/L (ref 3.5–5.1)
Sodium: 127 mmol/L — ABNORMAL LOW (ref 135–145)
Total Bilirubin: 1.6 mg/dL — ABNORMAL HIGH (ref 0.3–1.2)
Total Protein: 6.6 g/dL (ref 6.5–8.1)

## 2017-05-03 LAB — I-STAT TROPONIN, ED: TROPONIN I, POC: 0 ng/mL (ref 0.00–0.08)

## 2017-05-03 LAB — CBC WITH DIFFERENTIAL/PLATELET
Basophils Absolute: 0 10*3/uL (ref 0.0–0.1)
Basophils Relative: 1 %
Eosinophils Absolute: 0.1 10*3/uL (ref 0.0–0.7)
Eosinophils Relative: 2 %
HCT: 38.5 % — ABNORMAL LOW (ref 39.0–52.0)
HEMOGLOBIN: 14.1 g/dL (ref 13.0–17.0)
LYMPHS PCT: 45 %
Lymphs Abs: 2.7 10*3/uL (ref 0.7–4.0)
MCH: 31.8 pg (ref 26.0–34.0)
MCHC: 36.6 g/dL — ABNORMAL HIGH (ref 30.0–36.0)
MCV: 86.7 fL (ref 78.0–100.0)
Monocytes Absolute: 0.6 10*3/uL (ref 0.1–1.0)
Monocytes Relative: 10 %
NEUTROS ABS: 2.6 10*3/uL (ref 1.7–7.7)
NEUTROS PCT: 43 %
Platelets: 228 10*3/uL (ref 150–400)
RBC: 4.44 MIL/uL (ref 4.22–5.81)
RDW: 14.7 % (ref 11.5–15.5)
WBC: 6.1 10*3/uL (ref 4.0–10.5)

## 2017-05-03 LAB — ETHANOL: Alcohol, Ethyl (B): 19 mg/dL — ABNORMAL HIGH (ref <5)

## 2017-05-03 LAB — CBG MONITORING, ED: Glucose-Capillary: 183 mg/dL — ABNORMAL HIGH (ref 65–99)

## 2017-05-03 LAB — LIPASE, BLOOD: Lipase: 29 U/L (ref 11–51)

## 2017-05-03 MED ORDER — PERMETHRIN 5 % EX CREA
TOPICAL_CREAM | Freq: Once | CUTANEOUS | Status: AC
  Administered 2017-05-03: 17:00:00 via TOPICAL
  Filled 2017-05-03: qty 60

## 2017-05-03 MED ORDER — GI COCKTAIL ~~LOC~~
30.0000 mL | Freq: Once | ORAL | Status: AC
  Administered 2017-05-03: 30 mL via ORAL
  Filled 2017-05-03: qty 30

## 2017-05-03 MED ORDER — IOPAMIDOL (ISOVUE-370) INJECTION 76%
INTRAVENOUS | Status: AC
  Administered 2017-05-03: 80 mL via INTRAVENOUS
  Filled 2017-05-03: qty 100

## 2017-05-03 MED ORDER — LORAZEPAM 2 MG/ML IJ SOLN
1.0000 mg | Freq: Once | INTRAMUSCULAR | Status: AC
  Administered 2017-05-03: 1 mg via INTRAVENOUS
  Filled 2017-05-03: qty 1

## 2017-05-03 MED ORDER — SODIUM CHLORIDE 0.9 % IV BOLUS (SEPSIS)
1000.0000 mL | Freq: Once | INTRAVENOUS | Status: AC
  Administered 2017-05-03: 1000 mL via INTRAVENOUS

## 2017-05-03 MED ORDER — CHLORDIAZEPOXIDE HCL 25 MG PO CAPS
ORAL_CAPSULE | ORAL | 0 refills | Status: AC
Start: 2017-05-03 — End: ?

## 2017-05-03 MED ORDER — PROMETHAZINE HCL 25 MG/ML IJ SOLN
25.0000 mg | Freq: Once | INTRAMUSCULAR | Status: AC
  Administered 2017-05-03: 25 mg via INTRAVENOUS
  Filled 2017-05-03: qty 1

## 2017-05-03 MED ORDER — ALBUTEROL SULFATE (2.5 MG/3ML) 0.083% IN NEBU
5.0000 mg | INHALATION_SOLUTION | Freq: Once | RESPIRATORY_TRACT | Status: AC
  Administered 2017-05-03: 5 mg via RESPIRATORY_TRACT
  Filled 2017-05-03: qty 6

## 2017-05-03 MED ORDER — PROMETHAZINE HCL 25 MG PO TABS: 25.0000 mg | ORAL_TABLET | Freq: Four times a day (QID) | ORAL | 0 refills | Status: AC | PRN

## 2017-05-03 MED ORDER — PERMETHRIN 5 % EX CREA: TOPICAL_CREAM | CUTANEOUS | 0 refills | Status: AC

## 2017-05-03 MED ORDER — KETOROLAC TROMETHAMINE 30 MG/ML IJ SOLN
30.0000 mg | Freq: Once | INTRAMUSCULAR | Status: AC
  Administered 2017-05-03: 30 mg via INTRAVENOUS
  Filled 2017-05-03: qty 1

## 2017-05-03 NOTE — ED Notes (Signed)
Pt provided with meal tray.

## 2017-05-03 NOTE — Progress Notes (Signed)
CSW contacted and informed by patient's RN, that patient is in need of transportation back to his motel because the bus does not run there. CSW spoke with patient at bedside, patient reported that he has been residing with a friend at a [employer] and that he does not have any family/friends to provide him with transportation. Patient reported that he arrived via EMS and that he does not have any money. CSW assisted patient in coordinating transportation and provided taxi voucher.   Celso SickleKimberly Lorelee Mclaurin, LCSWA [facility]  Clinical Social Worker [phone]

## 2017-05-03 NOTE — ED Notes (Signed)
Pt reports relief from SOB post neb tx.

## 2017-05-03 NOTE — ED Provider Notes (Signed)
[facility]-EMERGENCY DEPT Provider Note   CSN: [id number] Arrival date & time: 05/03/17  1042     History   Chief Complaint Chief Complaint  Patient presents with  . Chest Pain  . Shortness of Breath  . Headache    HPI David Doyle is a 46 y.o. male history of diabetes, reflux, hypertension, alcohol abuse, chronic pancreatitis here presenting with shortness of breath, epigastric pain, possible syncope. Patient states that he has been drinking alcohol daily for many years. He tried to quit multiple times but has not been successful. Over the last 2 days, he has been having vomiting and epigastric pain but still drinking alcohol. Today he was walking to a donut shop and then felt really lightheaded dizzy and short of breath and passed out. He got to the shop and then called EMS from there. He was noted to have oxygen of 92-93% on room air and was placed on 2 L nasal cannula. He also noticed a rash on his hands and feet. Patient denies any suicidal homicidal ideation. Patient states that he is living with his grandmother right now but he sometimes lives on the street.  The history is provided by the patient.    Past Medical History:  Diagnosis Date  . Bipolar 1 disorder (HCC)   . Depression   . Diabetes mellitus without complication (HCC)   . GERD (gastroesophageal reflux disease)   . Hypertension   . Pancreatitis   . Portal vein thrombosis   . PTSD (post-traumatic stress disorder)   . Substance abuse   . Suicidal behavior     Patient Active Problem List   Diagnosis Date Noted  . Inguinal hernia   . Renal cyst   . Type 2 diabetes mellitus without complication (HCC)   . Benign essential HTN   . Depression   . Gastroesophageal reflux disease   . Pancreatitis 03/09/2017  . Heroin dependence (HCC) 03/15/2016  . 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 Opioid type dependence, continuous (HCC)  01/03/2016  . Alcohol dependence (HCC) 01/03/2016  . PTSD (post-traumatic stress disorder) 01/03/2016  . Suicidal ideation 01/03/2016  . Severe recurrent major depression without psychotic features (HCC) 01/03/2016  . BACK PAIN 07/15/2011    Past Surgical History:  Procedure Laterality Date  . ADENOIDECTOMY    . TONSILLECTOMY         Home Medications    Prior to Admission medications   Medication Sig Start Date End Date Taking? Authorizing Provider  atorvastatin (LIPITOR) 40 MG tablet Take 1 tablet (40 mg total) by mouth daily. For high cholesterol Patient not taking: Reported on 04/22/2017 03/13/17   Vassie Loll, MD  insulin detemir (LEVEMIR) 100 UNIT/ML injection Inject 0.1 mLs (10 Units total) into the skin at bedtime. Patient not taking: Reported on 04/22/2017 03/18/16   Thermon Leyland, NP  losartan (COZAAR) 50 MG tablet Take 1 tablet (50 mg total) by mouth daily. Patient not taking: Reported on 04/22/2017 03/13/17   Vassie Loll, MD  metFORMIN (GLUCOPHAGE) 500 MG tablet Take 1 tablet (500 mg total) by mouth 2 (two) times daily with a meal. For diabetes management Patient not taking: Reported on 04/22/2017 03/13/17   Vassie Loll, MD  omeprazole (PRILOSEC) 20 MG capsule Take 1 capsule (20 mg total) by mouth daily. 04/22/17   Tilden Fossa, MD  pantoprazole (PROTONIX) 40 MG tablet Take 1 tablet (40 mg total) by mouth 2 (two) times daily.  For acid reflux 03/13/17   Vassie LollMadera, Carlos, MD  prazosin (MINIPRESS) 2 MG capsule Take 1 capsule (2 mg total) by mouth at bedtime. Patient not taking: Reported on 04/22/2017 03/13/17   Vassie LollMadera, Carlos, MD  promethazine (PHENERGAN) 25 MG tablet Take 1 tablet (25 mg total) by mouth every 6 (six) hours as needed for nausea or vomiting. 04/22/17   Tilden Fossaees, Elizabeth, MD  QUEtiapine (SEROQUEL) 100 MG tablet Take 1 tablet (100 mg total) by mouth at bedtime. 03/13/17   Vassie LollMadera, Carlos, MD  sertraline (ZOLOFT) 100 MG tablet Take 1.5 tablets (150 mg total) by mouth daily.  For depression Patient not taking: Reported on 04/22/2017 03/13/17   Vassie LollMadera, Carlos, MD  sucralfate (CARAFATE) 1 g tablet Take 1 tablet (1 g total) by mouth 4 (four) times daily. 03/23/17   Lorre NickAllen, Anthony, MD  thiamine 100 MG tablet Take 1 tablet (100 mg total) by mouth daily. Patient not taking: Reported on 04/22/2017 03/14/17   Vassie LollMadera, Carlos, MD    Family History No family history on file.  Social History Social History  Substance Use Topics  . Smoking status: Current Every Day Smoker    Packs/day: 2.00  . Smokeless tobacco: Never Used  . Alcohol use Yes     Comment: occasionally     Allergies   Patient has no known allergies.   Review of Systems Review of Systems  Respiratory: Positive for shortness of breath.   Cardiovascular: Positive for chest pain.  Gastrointestinal: Positive for abdominal pain.  Neurological: Positive for headaches.  All other systems reviewed and are negative.    Physical Exam Updated Vital Signs BP (!) 143/91 (BP Location: Left Arm)   Pulse 95   Temp 97.9 F (36.6 C) (Oral)   Resp 14   SpO2 93%   Physical Exam  Constitutional: He is oriented to person, place, and time.  Slightly dehydrated, intoxicated   HENT:  Head: Normocephalic.  MM dry   Eyes: EOM are normal. Pupils are equal, round, and reactive to light.  Neck: Normal range of motion. Neck supple.  Cardiovascular: Normal rate, regular rhythm and normal heart sounds.   Pulmonary/Chest: Effort normal and breath sounds normal. No respiratory distress. He has no wheezes. He has no rales.  Abdominal: Soft. Bowel sounds are normal.  + epigastric tenderness, no rebound.   Musculoskeletal: Normal range of motion.  Neurological: He is alert and oriented to person, place, and time. He displays normal reflexes. No cranial nerve deficit. Coordination normal.  Skin:  Vesicles on the shin and web spaces of hands consistent with scabies   Psychiatric: He has a normal mood and affect.  Nursing  note and vitals reviewed.    ED Treatments / Results  Labs (all labs ordered are listed, but only abnormal results are displayed) Labs Reviewed  CBC WITH DIFFERENTIAL/PLATELET - Abnormal; Notable for the following:       Result Value   HCT 38.5 (*)    MCHC 36.6 (*)    All other components within normal limits  COMPREHENSIVE METABOLIC PANEL - Abnormal; Notable for the following:    Sodium 127 (*)    Chloride 87 (*)    Glucose, Bld 172 (*)    Calcium 8.8 (*)    ALT 14 (*)    Total Bilirubin 1.6 (*)    All other components within normal limits  ETHANOL - Abnormal; Notable for the following:    Alcohol, Ethyl (B) 19 (*)    All other components within normal  limits  CBG MONITORING, ED - Abnormal; Notable for the following:    Glucose-Capillary 183 (*)    All other components within normal limits  LIPASE, BLOOD  I-STAT TROPOININ, ED    EKG  EKG Interpretation  Date/Time:  Sunday May 03 2017 10:52:19 EDT Ventricular Rate:  95 PR Interval:    QRS Duration: 104 QT Interval:  375 QTC Calculation: 472 R Axis:   80 Text Interpretation:  Sinus rhythm Prolonged PR interval ST elev, probable normal early repol pattern Baseline wander in lead(s) I III aVL No significant change since last tracing Confirmed by Gryphon Vanderveen  MD, Sher Hellinger ([id number]) on 05/03/2017 10:58:52 AM       Radiology Dg Chest 2 View  Result Date: 05/03/2017 CLINICAL DATA:  Middle CP this morning; no known cardiopulmonary problems; HTN; diabetic; smoker EXAM: CHEST  2 VIEW COMPARISON:  Chest x-ray dated 09/30/2014. FINDINGS: Heart size and mediastinal contours are normal. Lungs are clear. No pleural effusion or pneumothorax seen. Mild degenerative spurring within the thoracic spine. No acute or suspicious osseous finding. IMPRESSION: No active cardiopulmonary disease. No evidence of pneumonia or pulmonary edema. Electronically Signed   By: Bary Richard M.D.   On: 05/03/2017 11:36   Ct Angio Chest Pe W Or Wo Contrast  Result  Date: 05/03/2017 CLINICAL DATA:  Dyspnea. Epigastric and lower chest pain. Alcohol dependence. Substance abuse. EXAM: CT ANGIOGRAPHY CHEST WITH CONTRAST TECHNIQUE: Multidetector CT imaging of the chest was performed using the standard protocol during bolus administration of intravenous contrast. Multiplanar CT image reconstructions and MIPs were obtained to evaluate the vascular anatomy. CONTRAST:  80 cc Isovue 370 IV. COMPARISON:  Chest radiograph from earlier today. FINDINGS: Cardiovascular: The study is low quality for the evaluation of pulmonary embolism, with evaluation of the segmental, subsegmental and lobar pulmonary arteries precluded by a combination of poor contrast opacification and respiratory motion artifact. There are no filling defects in the central pulmonary arteries to suggest acute pulmonary embolism. Great vessels are normal in course and caliber. Main pulmonary artery diameter 2.5 cm. Normal heart size. No significant pericardial fluid/thickening. Mediastinum/Nodes: No discrete thyroid nodules. Unremarkable esophagus. No pathologically enlarged axillary, mediastinal or hilar lymph nodes. Lungs/Pleura: No pneumothorax. No pleural effusion. No acute consolidative airspace disease, lung masses or significant pulmonary nodules. Upper abdomen: Diffuse hepatic steatosis. Musculoskeletal: No aggressive appearing focal osseous lesions. Moderate thoracic spondylosis. Review of the MIP images confirms the above findings. IMPRESSION: 1. Limited scan, see comments. No central pulmonary embolism. If the patient's respiratory symptoms persist, a V/Q scan or repeat chest CT angiogram could be obtained, as clinically warranted. 2. No acute pulmonary disease . 3. Diffuse hepatic steatosis. Electronically Signed   By: Delbert Phenix M.D.   On: 05/03/2017 13:23    Procedures Procedures (including critical care time)  Medications Ordered in ED Medications  permethrin (ELIMITE) 5 % cream (not administered)    sodium chloride 0.9 % bolus 1,000 mL (0 mLs Intravenous Stopped 05/03/17 1246)  promethazine (PHENERGAN) injection 25 mg (25 mg Intravenous Given 05/03/17 1136)  albuterol (PROVENTIL) (2.5 MG/3ML) 0.083% nebulizer solution 5 mg (5 mg Nebulization Given 05/03/17 1137)  iopamidol (ISOVUE-370) 76 % injection (80 mLs Intravenous Contrast Given 05/03/17 1247)  ketorolac (TORADOL) 30 MG/ML injection 30 mg (30 mg Intravenous Given 05/03/17 1308)  gi cocktail (Maalox,Lidocaine,Donnatal) (30 mLs Oral Given 05/03/17 1308)  sodium chloride 0.9 % bolus 1,000 mL (1,000 mLs Intravenous New Bag/Given 05/03/17 1415)  LORazepam (ATIVAN) injection 1 mg (1 mg Intravenous Given 05/03/17  1415)     Initial Impression / Assessment and Plan / ED Course  I have reviewed the triage vital signs and the nursing notes.  Pertinent labs & imaging results that were available during my care of the patient were reviewed by me and considered in my medical decision making (see chart for details).     Darrow A II Pember is a 46 y.o. male here with epigastric pain, shortness of breath, syncope. Borderline O2 89-92% on RA. He is a smoker and drinks alcohol chronically. Consider pancreatitis vs COPD. Will get labs, lipase, ETOH, CXR.   3:09 PM Na 127, was 130 before. Given 2 L NS bolus. O2 95-98% on RA now. Ambulated and doesn't desat. CT showed no obvious PE. ETOH 19. Patient in mild withdrawal, given ativan and felt better. Lipase normal. I told patient that he needs to use permethrin and his immediate family needs to use it too for scabies. I told him that it will be difficult to get into alcohol rehab facility with scabies. I gave him a tube of permethrin. Will give librium as well.   Final Clinical Impressions(s) / ED Diagnoses   Final diagnoses:  Shortness of breath    New Prescriptions New Prescriptions   No medications on file     Charlynne Pander, MD 05/03/17 1513

## 2017-05-03 NOTE — ED Triage Notes (Signed)
Pt in via EMS from a restaurant- Per EMS pt c/o epigastric to lower chest pain with no radiation. Pt adds that he has N/V x2 days. Pt is DM II and has not taken his meds. Pt is homeless and wishes to detox from ETOH. Pt was seen recently for pancreatitis. Pt last drank ETOH around 2 am, 2- 12 packs. Pt was 93-94% on RA and c/o SOB. EMS placed pt on 2L which resolved SOB and O2 sats to 98%. Pt is a current everyday smoker. Pt is A&O and in NAD

## 2017-05-03 NOTE — ED Notes (Signed)
Bed: [facility]17 Expected date:  Expected time:  Means of arrival:  Comments: EMS chest pain 

## 2017-05-03 NOTE — Discharge Instructions (Addendum)
Use permethrin cream and leave it on for 4-6 hrs and then wash it off. Reapply in a week. Your family members need to use it as well.   Stop drinking alcohol. Call your counselor regarding alcohol detox.   Take librium as needed for withdrawal symptoms.   Stay hydrated.   Take phenergan as needed for nausea or vomiting.   Your sodium level is slightly low and that is likely from alcohol. Repeat sodium level next week.   See your doctor  Return to ER if you have severe abdominal pain, vomiting, passing out, trouble breathing.

## 2017-05-03 NOTE — ED Notes (Addendum)
Pt ambulated w/o assistance around dept. Pt O2 maintained @ 98% and HR @ 120. Pt denied SOB or dizziness while ambulating.

## 2017-05-03 NOTE — ED Notes (Signed)
While attempting to d/c pt, pt sts that he does not have anyway to get back to his hotel. The bus does not travel out that far. SW will talk to pt and arrange for transport back to hotel.

## 2017-05-07 ENCOUNTER — Emergency Department (HOSPITAL_BASED_OUTPATIENT_CLINIC_OR_DEPARTMENT_OTHER): Payer: Medicaid Other

## 2017-05-07 ENCOUNTER — Inpatient Hospital Stay (HOSPITAL_COMMUNITY): Payer: Medicaid Other

## 2017-05-07 ENCOUNTER — Inpatient Hospital Stay (HOSPITAL_BASED_OUTPATIENT_CLINIC_OR_DEPARTMENT_OTHER)
Admission: EM | Admit: 2017-05-07 | Discharge: 2017-05-22 | DRG: 438 | Disposition: E | Payer: Medicaid Other | Attending: Pulmonary Disease | Admitting: Pulmonary Disease

## 2017-05-07 ENCOUNTER — Encounter (HOSPITAL_BASED_OUTPATIENT_CLINIC_OR_DEPARTMENT_OTHER): Payer: Self-pay | Admitting: *Deleted

## 2017-05-07 DIAGNOSIS — F10239 Alcohol dependence with withdrawal, unspecified: Secondary | ICD-10-CM | POA: Diagnosis present

## 2017-05-07 DIAGNOSIS — E1110 Type 2 diabetes mellitus with ketoacidosis without coma: Secondary | ICD-10-CM | POA: Diagnosis present

## 2017-05-07 DIAGNOSIS — F17210 Nicotine dependence, cigarettes, uncomplicated: Secondary | ICD-10-CM | POA: Diagnosis present

## 2017-05-07 DIAGNOSIS — R6510 Systemic inflammatory response syndrome (SIRS) of non-infectious origin without acute organ dysfunction: Secondary | ICD-10-CM

## 2017-05-07 DIAGNOSIS — E876 Hypokalemia: Secondary | ICD-10-CM | POA: Diagnosis present

## 2017-05-07 DIAGNOSIS — F1210 Cannabis abuse, uncomplicated: Secondary | ICD-10-CM | POA: Diagnosis present

## 2017-05-07 DIAGNOSIS — I469 Cardiac arrest, cause unspecified: Secondary | ICD-10-CM | POA: Diagnosis not present

## 2017-05-07 DIAGNOSIS — D696 Thrombocytopenia, unspecified: Secondary | ICD-10-CM | POA: Diagnosis not present

## 2017-05-07 DIAGNOSIS — R001 Bradycardia, unspecified: Secondary | ICD-10-CM | POA: Diagnosis not present

## 2017-05-07 DIAGNOSIS — F1410 Cocaine abuse, uncomplicated: Secondary | ICD-10-CM | POA: Diagnosis present

## 2017-05-07 DIAGNOSIS — F1910 Other psychoactive substance abuse, uncomplicated: Secondary | ICD-10-CM | POA: Diagnosis present

## 2017-05-07 DIAGNOSIS — J9601 Acute respiratory failure with hypoxia: Secondary | ICD-10-CM | POA: Diagnosis not present

## 2017-05-07 DIAGNOSIS — E7800 Pure hypercholesterolemia, unspecified: Secondary | ICD-10-CM | POA: Diagnosis present

## 2017-05-07 DIAGNOSIS — F1020 Alcohol dependence, uncomplicated: Secondary | ICD-10-CM | POA: Diagnosis present

## 2017-05-07 DIAGNOSIS — Z590 Homelessness: Secondary | ICD-10-CM

## 2017-05-07 DIAGNOSIS — Z9114 Patient's other noncompliance with medication regimen: Secondary | ICD-10-CM

## 2017-05-07 DIAGNOSIS — K852 Alcohol induced acute pancreatitis without necrosis or infection: Secondary | ICD-10-CM | POA: Diagnosis present

## 2017-05-07 DIAGNOSIS — E872 Acidosis: Secondary | ICD-10-CM | POA: Diagnosis present

## 2017-05-07 DIAGNOSIS — E1165 Type 2 diabetes mellitus with hyperglycemia: Secondary | ICD-10-CM | POA: Diagnosis present

## 2017-05-07 DIAGNOSIS — G9341 Metabolic encephalopathy: Secondary | ICD-10-CM | POA: Diagnosis present

## 2017-05-07 DIAGNOSIS — F319 Bipolar disorder, unspecified: Secondary | ICD-10-CM | POA: Diagnosis present

## 2017-05-07 DIAGNOSIS — F1120 Opioid dependence, uncomplicated: Secondary | ICD-10-CM | POA: Diagnosis present

## 2017-05-07 DIAGNOSIS — R402412 Glasgow coma scale score 13-15, at arrival to emergency department: Secondary | ICD-10-CM | POA: Diagnosis present

## 2017-05-07 DIAGNOSIS — R578 Other shock: Secondary | ICD-10-CM | POA: Diagnosis present

## 2017-05-07 DIAGNOSIS — F10220 Alcohol dependence with intoxication, uncomplicated: Secondary | ICD-10-CM

## 2017-05-07 DIAGNOSIS — N179 Acute kidney failure, unspecified: Secondary | ICD-10-CM | POA: Diagnosis not present

## 2017-05-07 DIAGNOSIS — R109 Unspecified abdominal pain: Secondary | ICD-10-CM | POA: Diagnosis present

## 2017-05-07 DIAGNOSIS — I10 Essential (primary) hypertension: Secondary | ICD-10-CM | POA: Diagnosis present

## 2017-05-07 DIAGNOSIS — E785 Hyperlipidemia, unspecified: Secondary | ICD-10-CM | POA: Diagnosis present

## 2017-05-07 DIAGNOSIS — K859 Acute pancreatitis without necrosis or infection, unspecified: Secondary | ICD-10-CM

## 2017-05-07 DIAGNOSIS — F1110 Opioid abuse, uncomplicated: Secondary | ICD-10-CM | POA: Diagnosis present

## 2017-05-07 DIAGNOSIS — Z79899 Other long term (current) drug therapy: Secondary | ICD-10-CM

## 2017-05-07 DIAGNOSIS — E861 Hypovolemia: Secondary | ICD-10-CM | POA: Diagnosis not present

## 2017-05-07 DIAGNOSIS — K219 Gastro-esophageal reflux disease without esophagitis: Secondary | ICD-10-CM | POA: Diagnosis present

## 2017-05-07 DIAGNOSIS — I4901 Ventricular fibrillation: Secondary | ICD-10-CM | POA: Diagnosis not present

## 2017-05-07 DIAGNOSIS — F4310 Post-traumatic stress disorder, unspecified: Secondary | ICD-10-CM | POA: Diagnosis present

## 2017-05-07 DIAGNOSIS — Z66 Do not resuscitate: Secondary | ICD-10-CM | POA: Diagnosis not present

## 2017-05-07 DIAGNOSIS — Z794 Long term (current) use of insulin: Secondary | ICD-10-CM

## 2017-05-07 DIAGNOSIS — D751 Secondary polycythemia: Secondary | ICD-10-CM | POA: Diagnosis not present

## 2017-05-07 DIAGNOSIS — J9811 Atelectasis: Secondary | ICD-10-CM

## 2017-05-07 LAB — BLOOD GAS, ARTERIAL
Acid-base deficit: 15 mmol/L — ABNORMAL HIGH (ref 0.0–2.0)
BICARBONATE: 12.5 mmol/L — AB (ref 20.0–28.0)
DRAWN BY: 422461
Drawn by: 270211
FIO2: 0.21
FIO2: 100
MECHVT: 600 mL
O2 SAT: 96.8 %
O2 Saturation: 92.3 %
PATIENT TEMPERATURE: 98.6
PCO2 ART: 33.8 mmHg (ref 32.0–48.0)
PCO2 ART: 32.3 mmHg (ref 32.0–48.0)
PEEP/CPAP: 5 cmH2O
PH ART: 7.195 — AB (ref 7.350–7.450)
Patient temperature: 98.6
RATE: 24 resp/min
pO2, Arterial: 81.1 mmHg — ABNORMAL LOW (ref 83.0–108.0)
pO2, Arterial: 174 mmHg — ABNORMAL HIGH (ref 83.0–108.0)

## 2017-05-07 LAB — CBC WITH DIFFERENTIAL/PLATELET
BASOS PCT: 0 %
Basophils Absolute: 0 10*3/uL (ref 0.0–0.1)
EOS PCT: 0 %
Eosinophils Absolute: 0 10*3/uL (ref 0.0–0.7)
HEMATOCRIT: 46 % (ref 39.0–52.0)
Hemoglobin: 17.5 g/dL — ABNORMAL HIGH (ref 13.0–17.0)
LYMPHS ABS: 1.8 10*3/uL (ref 0.7–4.0)
Lymphocytes Relative: 8 %
MCH: 32.2 pg (ref 26.0–34.0)
MCHC: 38 g/dL — ABNORMAL HIGH (ref 30.0–36.0)
MCV: 84.7 fL (ref 78.0–100.0)
Monocytes Absolute: 0.9 10*3/uL (ref 0.1–1.0)
Monocytes Relative: 4 %
Neutro Abs: 19.7 10*3/uL — ABNORMAL HIGH (ref 1.7–7.7)
Neutrophils Relative %: 88 %
Platelets: 200 10*3/uL (ref 150–400)
RBC: 5.43 MIL/uL (ref 4.22–5.81)
RDW: 14.2 % (ref 11.5–15.5)
WBC: 22.4 10*3/uL — ABNORMAL HIGH (ref 4.0–10.5)

## 2017-05-07 LAB — MRSA PCR SCREENING: MRSA by PCR: NEGATIVE

## 2017-05-07 LAB — BASIC METABOLIC PANEL
Anion gap: 20 — ABNORMAL HIGH (ref 5–15)
Anion gap: 12 (ref 5–15)
BUN: 9 mg/dL (ref 6–20)
BUN: 9 mg/dL (ref 6–20)
CALCIUM: 7 mg/dL — AB (ref 8.9–10.3)
CALCIUM: 6.6 mg/dL — AB (ref 8.9–10.3)
CO2: 13 mmol/L — ABNORMAL LOW (ref 22–32)
CO2: 16 mmol/L — ABNORMAL LOW (ref 22–32)
CREATININE: 1.92 mg/dL — AB (ref 0.61–1.24)
Chloride: 105 mmol/L (ref 101–111)
Chloride: 113 mmol/L — ABNORMAL HIGH (ref 101–111)
Creatinine, Ser: 1.35 mg/dL — ABNORMAL HIGH (ref 0.61–1.24)
GFR calc Af Amer: >60 mL/min (ref >60)
GFR calc Af Amer: 47 mL/min — ABNORMAL LOW (ref >60)
GFR, EST NON AFRICAN AMERICAN: 41 mL/min — AB (ref >60)
Glucose, Bld: 250 mg/dL — ABNORMAL HIGH (ref 65–99)
Glucose, Bld: 100 mg/dL — ABNORMAL HIGH (ref 65–99)
POTASSIUM: 3.4 mmol/L — AB (ref 3.5–5.1)
Potassium: 3.4 mmol/L — ABNORMAL LOW (ref 3.5–5.1)
SODIUM: 138 mmol/L (ref 135–145)
SODIUM: 141 mmol/L (ref 135–145)

## 2017-05-07 LAB — GLUCOSE, CAPILLARY
GLUCOSE-CAPILLARY: 350 mg/dL — AB (ref 65–99)
GLUCOSE-CAPILLARY: 111 mg/dL — AB (ref 65–99)
GLUCOSE-CAPILLARY: 128 mg/dL — AB (ref 65–99)
GLUCOSE-CAPILLARY: 184 mg/dL — AB (ref 65–99)
GLUCOSE-CAPILLARY: 168 mg/dL — AB (ref 65–99)
Glucose-Capillary: 111 mg/dL — ABNORMAL HIGH (ref 65–99)
Glucose-Capillary: 169 mg/dL — ABNORMAL HIGH (ref 65–99)
Glucose-Capillary: 174 mg/dL — ABNORMAL HIGH (ref 65–99)
Glucose-Capillary: 270 mg/dL — ABNORMAL HIGH (ref 65–99)
Glucose-Capillary: 86 mg/dL (ref 65–99)
Glucose-Capillary: 91 mg/dL (ref 65–99)

## 2017-05-07 LAB — PHOSPHORUS: PHOSPHORUS: 3.1 mg/dL (ref 2.5–4.6)

## 2017-05-07 LAB — CBC
HCT: 54.1 % — ABNORMAL HIGH (ref 39.0–52.0)
Hemoglobin: 18.8 g/dL — ABNORMAL HIGH (ref 13.0–17.0)
MCH: 32.3 pg (ref 26.0–34.0)
MCHC: 34.8 g/dL (ref 30.0–36.0)
MCV: 93 fL (ref 78.0–100.0)
PLATELETS: 149 10*3/uL — AB (ref 150–400)
RBC: 5.82 MIL/uL — AB (ref 4.22–5.81)
RDW: 15.3 % (ref 11.5–15.5)
WBC: 12.5 10*3/uL — AB (ref 4.0–10.5)

## 2017-05-07 LAB — COMPREHENSIVE METABOLIC PANEL
ALBUMIN: 3.9 g/dL (ref 3.5–5.0)
ALK PHOS: 217 U/L — AB (ref 38–126)
ALT: 42 U/L (ref 17–63)
AST: 51 U/L — ABNORMAL HIGH (ref 15–41)
Anion gap: 22 — ABNORMAL HIGH (ref 5–15)
BILIRUBIN TOTAL: 0.3 mg/dL (ref 0.3–1.2)
BUN: 7 mg/dL (ref 6–20)
CO2: 21 mmol/L — ABNORMAL LOW (ref 22–32)
Calcium: 8.8 mg/dL — ABNORMAL LOW (ref 8.9–10.3)
Chloride: 88 mmol/L — ABNORMAL LOW (ref 101–111)
Creatinine, Ser: 0.78 mg/dL (ref 0.61–1.24)
GFR calc Af Amer: >60 mL/min (ref >60)
GFR calc non Af Amer: >60 mL/min (ref >60)
GLUCOSE: 358 mg/dL — AB (ref 65–99)
Potassium: 3.2 mmol/L — ABNORMAL LOW (ref 3.5–5.1)
Sodium: 131 mmol/L — ABNORMAL LOW (ref 135–145)
TOTAL PROTEIN: 7.3 g/dL (ref 6.5–8.1)

## 2017-05-07 LAB — CBG MONITORING, ED
Glucose-Capillary: 329 mg/dL — ABNORMAL HIGH (ref 65–99)
Glucose-Capillary: 309 mg/dL — ABNORMAL HIGH (ref 65–99)
Glucose-Capillary: 335 mg/dL — ABNORMAL HIGH (ref 65–99)

## 2017-05-07 LAB — RAPID URINE DRUG SCREEN, HOSP PERFORMED
AMPHETAMINES: NOT DETECTED
Barbiturates: NOT DETECTED
Benzodiazepines: NOT DETECTED
Cocaine: POSITIVE — AB
Opiates: POSITIVE — AB
Tetrahydrocannabinol: NOT DETECTED

## 2017-05-07 LAB — LACTIC ACID, PLASMA: LACTIC ACID, VENOUS: 6.9 mmol/L — AB (ref 0.5–1.9)

## 2017-05-07 LAB — MAGNESIUM
MAGNESIUM: 1.8 mg/dL (ref 1.7–2.4)
Magnesium: 1.3 mg/dL — ABNORMAL LOW (ref 1.7–2.4)

## 2017-05-07 LAB — ETHANOL: Alcohol, Ethyl (B): 125 mg/dL — ABNORMAL HIGH (ref <5)

## 2017-05-07 LAB — TRIGLYCERIDES: TRIGLYCERIDES: 2280 mg/dL — AB (ref <150)

## 2017-05-07 LAB — TROPONIN I: Troponin I: <0.03 ng/mL (ref <0.03)

## 2017-05-07 LAB — LIPASE, BLOOD: Lipase: 602 U/L — ABNORMAL HIGH (ref 11–51)

## 2017-05-07 LAB — PROCALCITONIN: Procalcitonin: 17.77 ng/mL

## 2017-05-07 MED ORDER — FENTANYL CITRATE (PF) 100 MCG/2ML IJ SOLN: 100.0000 ug | INTRAMUSCULAR | Status: DC | PRN

## 2017-05-07 MED ORDER — IPRATROPIUM-ALBUTEROL 0.5-2.5 (3) MG/3ML IN SOLN: 3.0000 mL | Freq: Four times a day (QID) | RESPIRATORY_TRACT | Status: DC

## 2017-05-07 MED ORDER — SODIUM CHLORIDE 0.9 % IV BOLUS (SEPSIS)
2000.0000 mL | Freq: Once | INTRAVENOUS | Status: AC
  Administered 2017-05-07: 2000 mL via INTRAVENOUS

## 2017-05-07 MED ORDER — PHENYLEPHRINE HCL-NACL 10-0.9 MG/250ML-% IV SOLN
0.0000 ug/min | INTRAVENOUS | Status: DC
  Administered 2017-05-07: 20 ug/min via INTRAVENOUS
  Filled 2017-05-07: qty 250

## 2017-05-07 MED ORDER — SODIUM CHLORIDE 0.9 % IV BOLUS (SEPSIS)
1000.0000 mL | Freq: Once | INTRAVENOUS | Status: AC
  Administered 2017-05-07: 1000 mL via INTRAVENOUS

## 2017-05-07 MED ORDER — FAMOTIDINE IN NACL 20-0.9 MG/50ML-% IV SOLN: 20.0000 mg | Freq: Two times a day (BID) | INTRAVENOUS | Status: DC

## 2017-05-07 MED ORDER — EPINEPHRINE PF 1 MG/10ML IJ SOSY
PREFILLED_SYRINGE | INTRAMUSCULAR | Status: AC
  Filled 2017-05-07: qty 20

## 2017-05-07 MED ORDER — ORAL CARE MOUTH RINSE: 15.0000 mL | Freq: Four times a day (QID) | OROMUCOSAL | Status: DC

## 2017-05-07 MED ORDER — METOCLOPRAMIDE HCL 5 MG/ML IJ SOLN
5.0000 mg | Freq: Once | INTRAMUSCULAR | Status: AC
  Administered 2017-05-07: 5 mg via INTRAVENOUS
  Filled 2017-05-07: qty 2

## 2017-05-07 MED ORDER — AMIODARONE LOAD VIA INFUSION
150.0000 mg | Freq: Once | INTRAVENOUS | Status: AC
  Administered 2017-05-08: 150 mg via INTRAVENOUS
  Filled 2017-05-07: qty 83.34

## 2017-05-07 MED ORDER — ADULT MULTIVITAMIN W/MINERALS CH: 1.0000 | ORAL_TABLET | Freq: Every day | ORAL | Status: DC

## 2017-05-07 MED ORDER — SODIUM CHLORIDE 0.9 % IV BOLUS (SEPSIS)
500.0000 mL | Freq: Once | INTRAVENOUS | Status: AC
  Administered 2017-05-07: 500 mL via INTRAVENOUS

## 2017-05-07 MED ORDER — DEXTROSE-NACL 5-0.45 % IV SOLN: INTRAVENOUS | Status: DC

## 2017-05-07 MED ORDER — SODIUM CHLORIDE 0.9 % IV SOLN
80.0000 mg | Freq: Once | INTRAVENOUS | Status: AC
  Administered 2017-05-07: 09:00:00 80 mg via INTRAVENOUS
  Filled 2017-05-07: qty 80

## 2017-05-07 MED ORDER — POTASSIUM CHLORIDE IN NACL 20-0.9 MEQ/L-% IV SOLN
Freq: Once | INTRAVENOUS | Status: AC
  Administered 2017-05-07: 09:00:00 via INTRAVENOUS
  Filled 2017-05-07: qty 1000

## 2017-05-07 MED ORDER — LORAZEPAM 2 MG/ML IJ SOLN
INTRAMUSCULAR | Status: AC
  Filled 2017-05-07: qty 1

## 2017-05-07 MED ORDER — POTASSIUM CHLORIDE 10 MEQ/100ML IV SOLN
INTRAVENOUS | Status: AC
  Filled 2017-05-07: qty 100

## 2017-05-07 MED ORDER — ENOXAPARIN SODIUM 40 MG/0.4ML ~~LOC~~ SOLN
40.0000 mg | Freq: Every day | SUBCUTANEOUS | Status: DC
  Administered 2017-05-07: 40 mg via SUBCUTANEOUS
  Filled 2017-05-07: qty 0.4

## 2017-05-07 MED ORDER — CHLORHEXIDINE GLUCONATE 0.12% ORAL RINSE (MEDLINE KIT): 15.0000 mL | Freq: Two times a day (BID) | OROMUCOSAL | Status: DC

## 2017-05-07 MED ORDER — SODIUM CHLORIDE 0.9 % IV SOLN
INTRAVENOUS | Status: DC
  Administered 2017-05-07: 15:00:00 via INTRAVENOUS

## 2017-05-07 MED ORDER — MIDAZOLAM HCL 2 MG/2ML IJ SOLN: 2.0000 mg | INTRAMUSCULAR | Status: DC | PRN

## 2017-05-07 MED ORDER — PANTOPRAZOLE SODIUM 40 MG IV SOLR
INTRAVENOUS | Status: AC
  Filled 2017-05-07: qty 80

## 2017-05-07 MED ORDER — SODIUM CHLORIDE 0.9 % IV SOLN: 30.0000 meq | Freq: Once | INTRAVENOUS | Status: DC

## 2017-05-07 MED ORDER — MORPHINE SULFATE (PF) 4 MG/ML IV SOLN
4.0000 mg | Freq: Once | INTRAVENOUS | Status: AC
  Administered 2017-05-07: 4 mg via INTRAVENOUS
  Filled 2017-05-07: qty 1

## 2017-05-07 MED ORDER — THIAMINE HCL 100 MG/ML IJ SOLN
100.0000 mg | Freq: Every day | INTRAMUSCULAR | Status: DC
  Administered 2017-05-07: 100 mg via INTRAVENOUS
  Filled 2017-05-07: qty 2

## 2017-05-07 MED ORDER — SODIUM BICARBONATE 8.4 % IV SOLN
INTRAVENOUS | Status: DC
  Administered 2017-05-08: 01:00:00 via INTRAVENOUS
  Filled 2017-05-07: qty 150

## 2017-05-07 MED ORDER — ONDANSETRON HCL 4 MG/2ML IJ SOLN
4.0000 mg | Freq: Once | INTRAMUSCULAR | Status: AC
  Administered 2017-05-07: 4 mg via INTRAVENOUS
  Filled 2017-05-07: qty 2

## 2017-05-07 MED ORDER — SODIUM CHLORIDE 0.9 % IV SOLN: INTRAVENOUS | Status: DC

## 2017-05-07 MED ORDER — DEXTROSE 50 % IV SOLN: 25.0000 mL | INTRAVENOUS | Status: DC | PRN

## 2017-05-07 MED ORDER — IPRATROPIUM-ALBUTEROL 0.5-2.5 (3) MG/3ML IN SOLN: 3.0000 mL | RESPIRATORY_TRACT | Status: DC | PRN

## 2017-05-07 MED ORDER — LORAZEPAM 2 MG/ML IJ SOLN
2.0000 mg | INTRAMUSCULAR | Status: DC | PRN
  Administered 2017-05-07: 2 mg via INTRAVENOUS
  Administered 2017-05-07: 3 mg via INTRAVENOUS
  Administered 2017-05-07: 1 mg via INTRAVENOUS
  Administered 2017-05-07: 2 mg via INTRAVENOUS
  Filled 2017-05-07 (×2): qty 1
  Filled 2017-05-07: qty 2
  Filled 2017-05-07: qty 1

## 2017-05-07 MED ORDER — LORAZEPAM 2 MG/ML IJ SOLN
2.0000 mg | Freq: Once | INTRAMUSCULAR | Status: AC
  Administered 2017-05-07: 2 mg via INTRAVENOUS

## 2017-05-07 MED ORDER — POLYETHYLENE GLYCOL 3350 17 G PO PACK: 17.0000 g | PACK | Freq: Two times a day (BID) | ORAL | Status: DC

## 2017-05-07 MED ORDER — HYDROMORPHONE HCL 1 MG/ML IJ SOLN
2.0000 mg | INTRAMUSCULAR | Status: DC | PRN
Start: 2017-05-07 — End: 2017-05-08
  Administered 2017-05-07: 2 mg via INTRAVENOUS
  Filled 2017-05-07: qty 2

## 2017-05-07 MED ORDER — HYDROMORPHONE HCL 1 MG/ML IJ SOLN
1.0000 mg | Freq: Once | INTRAMUSCULAR | Status: AC
  Administered 2017-05-07: 1 mg via INTRAVENOUS
  Filled 2017-05-07: qty 1

## 2017-05-07 MED ORDER — INSULIN REGULAR BOLUS VIA INFUSION
0.0000 [IU] | Freq: Three times a day (TID) | INTRAVENOUS | Status: DC
  Filled 2017-05-07: qty 10

## 2017-05-07 MED ORDER — AMIODARONE HCL IN DEXTROSE 360-4.14 MG/200ML-% IV SOLN: 30.0000 mg/h | INTRAVENOUS | Status: DC

## 2017-05-07 MED ORDER — DEXTROSE-NACL 5-0.45 % IV SOLN
INTRAVENOUS | Status: DC
  Administered 2017-05-07: 17:00:00 via INTRAVENOUS

## 2017-05-07 MED ORDER — POTASSIUM CHLORIDE 10 MEQ/100ML IV SOLN
10.0000 meq | INTRAVENOUS | Status: AC
  Administered 2017-05-07 (×3): 10 meq via INTRAVENOUS
  Filled 2017-05-07 (×2): qty 100

## 2017-05-07 MED ORDER — HYDRALAZINE HCL 20 MG/ML IJ SOLN: 10.0000 mg | Freq: Four times a day (QID) | INTRAMUSCULAR | Status: DC | PRN

## 2017-05-07 MED ORDER — ONDANSETRON HCL 4 MG/2ML IJ SOLN: 4.0000 mg | Freq: Four times a day (QID) | INTRAMUSCULAR | Status: DC | PRN

## 2017-05-07 MED ORDER — PANTOPRAZOLE SODIUM 40 MG IV SOLR: 40.0000 mg | INTRAVENOUS | Status: DC

## 2017-05-07 MED ORDER — AMIODARONE HCL IN DEXTROSE 360-4.14 MG/200ML-% IV SOLN
60.0000 mg/h | INTRAVENOUS | Status: AC
  Administered 2017-05-08: 60 mg/h via INTRAVENOUS
  Filled 2017-05-07: qty 200

## 2017-05-07 MED ORDER — MAGNESIUM SULFATE 50 % IJ SOLN
2.0000 g | Freq: Once | INTRAMUSCULAR | Status: AC
  Administered 2017-05-07: 2 g via INTRAVENOUS
  Filled 2017-05-07: qty 4

## 2017-05-07 MED ORDER — ALBUTEROL SULFATE (2.5 MG/3ML) 0.083% IN NEBU: 2.5000 mg | INHALATION_SOLUTION | RESPIRATORY_TRACT | Status: DC | PRN

## 2017-05-07 MED ORDER — AMIODARONE IV BOLUS ONLY 150 MG/100ML
INTRAVENOUS | Status: AC
  Administered 2017-05-07: 150 mg
  Filled 2017-05-07: qty 100

## 2017-05-07 MED ORDER — SODIUM BICARBONATE 8.4 % IV SOLN
100.0000 meq | Freq: Once | INTRAVENOUS | Status: AC
  Administered 2017-05-07: 100 meq via INTRAVENOUS

## 2017-05-07 MED ORDER — ACETAMINOPHEN 325 MG PO TABS: 650.0000 mg | ORAL_TABLET | Freq: Four times a day (QID) | ORAL | Status: DC | PRN

## 2017-05-07 MED ORDER — FOLIC ACID 5 MG/ML IJ SOLN
1.0000 mg | Freq: Every day | INTRAMUSCULAR | Status: DC
  Administered 2017-05-07: 1 mg via INTRAVENOUS
  Filled 2017-05-07: qty 0.2

## 2017-05-07 MED ORDER — SODIUM CHLORIDE 0.9 % IV SOLN
INTRAVENOUS | Status: DC
  Administered 2017-05-07: 2.7 [IU]/h via INTRAVENOUS
  Filled 2017-05-07 (×2): qty 1

## 2017-05-07 MED ORDER — NOREPINEPHRINE 4 MG/250ML-% IV SOLN
0.0000 ug/min | INTRAVENOUS | Status: DC
  Administered 2017-05-07: 10 ug/min via INTRAVENOUS
  Filled 2017-05-07 (×2): qty 250

## 2017-05-07 NOTE — Procedures (Signed)
Central Venous Catheter Insertion Procedure Note David Doyle [id number] 1971/01/21  Procedure: Insertion of Central Venous Catheter Indications: Assessment of intravascular volume  Procedure Details Consent: Unable to obtain consent because of emergent medical necessity. Time Out: Verified patient identification, verified procedure, site/side was marked, verified correct patient position, special equipment/implants available, medications/allergies/relevent history reviewed, required imaging and test results available.  Performed  Maximum sterile technique was used including antiseptics, cap, gloves, gown, hand hygiene, mask and sheet. Skin prep: Chlorhexidine; local anesthetic administered A antimicrobial bonded/coated triple lumen catheter was placed in the left subclavian vein using the Seldinger technique.  Ultrasound was used to verify the patency of the vein and for real time needle guidance.  Evaluation Blood flow good Complications: No apparent complications Patient did tolerate procedure well. Chest X-ray ordered to verify placement.  CXR: pending.  David Doyle 05/07/2017, 11:43 PM

## 2017-05-07 NOTE — Consult Note (Signed)
PCCM Consult Note  Admission date: 04/28/2017 Consult date: 05/02/2017 Referring provider: Dr. Jerral Ralph, [facility]  CC: Abdominal pain  HPI: Asked by Dr. Jerral Ralph with [facility] to assess pt for abdominal pain, pancreatitis, and hypotension.  46 yo homeless male was seen in ER on 5/02 with N/V/D and b/l flank pain.  He has hx of ETOH and polysubstance abuse.  He was given IV hydration, advised to stop drinking and discharged from ER.  On 5/13 he returned to ER with epigastric and chest pain associated with N/V.  He had CT chest that was negative for PE.  There was concern about alcohol withdrawal, and he was noted to have scabies.  He was discharged from ER.  He again returned to ER on 5/17 with N/V and abdominal pain.  He was found to have pancreatitis and hyperglycemia.  He was admitted by hospitalist.  He had persistent hypotension and PCCM asked to assess.  He  has a past medical history of Bipolar 1 disorder (HCC); Depression; Diabetes mellitus without complication (HCC); GERD (gastroesophageal reflux disease); Hypertension; Pancreatitis; Portal vein thrombosis; PTSD (post-traumatic stress disorder); Substance abuse; and Suicidal behavior.  He  has a past surgical history that includes Tonsillectomy and Adenoidectomy.  He is not able to provide family history.  He  reports that he has been smoking.  He has been smoking about 2.00 packs per day. He has never used smokeless tobacco. He reports that he drinks alcohol. He reports that he uses drugs, including Cocaine and IV.  No Known Allergies   No current facility-administered medications on file prior to encounter.    Current Outpatient Prescriptions on File Prior to Encounter  Medication Sig  . atorvastatin (LIPITOR) 40 MG tablet Take 1 tablet (40 mg total) by mouth daily. For high cholesterol (Patient not taking: Reported on 05/15/2017)  . chlordiazePOXIDE (LIBRIUM) 25 MG capsule 50mg  PO TID x 1D, then 25-50mg  PO BID X 1D, then 25-50mg  PO QD X  1D (Patient not taking: Reported on 05/16/2017)  . insulin detemir (LEVEMIR) 100 UNIT/ML injection Inject 0.1 mLs (10 Units total) into the skin at bedtime. (Patient not taking: Reported on 04/22/2017)  . losartan (COZAAR) 50 MG tablet Take 1 tablet (50 mg total) by mouth daily. (Patient not taking: Reported on 04/22/2017)  . metFORMIN (GLUCOPHAGE) 500 MG tablet Take 1 tablet (500 mg total) by mouth 2 (two) times daily with a meal. For diabetes management (Patient not taking: Reported on 04/22/2017)  . omeprazole (PRILOSEC) 20 MG capsule Take 1 capsule (20 mg total) by mouth daily. (Patient not taking: Reported on 05/02/2017)  . pantoprazole (PROTONIX) 40 MG tablet Take 1 tablet (40 mg total) by mouth 2 (two) times daily. For acid reflux (Patient not taking: Reported on 04/26/2017)  . permethrin (ELIMITE) 5 % cream Apply to affected area once (Patient not taking: Reported on 04/30/2017)  . prazosin (MINIPRESS) 2 MG capsule Take 1 capsule (2 mg total) by mouth at bedtime. (Patient not taking: Reported on 04/22/2017)  . promethazine (PHENERGAN) 25 MG tablet Take 1 tablet (25 mg total) by mouth every 6 (six) hours as needed for nausea or vomiting. (Patient not taking: Reported on 05/17/2017)  . QUEtiapine (SEROQUEL) 100 MG tablet Take 1 tablet (100 mg total) by mouth at bedtime. (Patient not taking: Reported on 04/21/2017)  . sertraline (ZOLOFT) 100 MG tablet Take 1.5 tablets (150 mg total) by mouth daily. For depression (Patient not taking: Reported on 04/22/2017)  . sucralfate (CARAFATE) 1 g tablet Take 1  tablet (1 g total) by mouth 4 (four) times daily. (Patient not taking: Reported on March 04, 2017)  . thiamine 100 MG tablet Take 1 tablet (100 mg total) by mouth daily. (Patient not taking: Reported on 04/22/2017)   ROS: Unable to obtain due to altered mental status after receiving ativan.  Vital signs: BP (!) 83/58   Pulse (!) 151 Comment: Dr Ethelda ChickJacubowitz aware, pt remains stable for transfer.  Temp 97.6 F (36.4 C)  (Oral)   Resp 20   Ht 5\' 11"  (1.803 m)   Wt 190 lb (86.2 kg)   SpO2 95%   BMI 26.50 kg/m   Intake/output: No intake/output data recorded.  General: somnolent Neuro: wakes up and follows simple commands, grunts few words, no tremor HEENT: pupils reactive, dry mucosa, no LAN, no stridor Cardiac: regular, tachycardic Chest: decreased BS, no wheeze Abd: mild distention, tender in mid epigastric region, decreased bowel sounds Ext: no edema Skin: multiple areas of excoriation   CMP Latest Ref Rng & Units March 04, 2017 March 04, 2017 05/03/2017  Glucose 65 - 99 mg/dL 119(J250(H) 478(G358(H) 956(O172(H)  BUN 6 - 20 mg/dL 9 7 7   Creatinine 0.61 - 1.24 mg/dL 1.30(Q1.35(H) 6.570.78 8.460.66  Sodium 135 - 145 mmol/L 138 131(L) 127(L)  Potassium 3.5 - 5.1 mmol/L 3.4(L) 3.2(L) 4.7  Chloride 101 - 111 mmol/L 105 88(L) 87(L)  CO2 22 - 32 mmol/L 13(L) 21(L) 26  Calcium 8.9 - 10.3 mg/dL 7.0(L) 8.8(L) 8.8(L)  Total Protein 6.5 - 8.1 g/dL - 7.3 6.6  Total Bilirubin 0.3 - 1.2 mg/dL - 0.3 9.6(E1.6(H)  Alkaline Phos 38 - 126 U/L - 217(H) 124  AST 15 - 41 U/L - 51(H) 39  ALT 17 - 63 U/L - 42 14(L)     CBC Latest Ref Rng & Units March 04, 2017 05/03/2017 04/22/2017  WBC 4.0 - 10.5 K/uL 22.4(H) 6.1 7.4  Hemoglobin 13.0 - 17.0 g/dL 17.5(H) 14.1 15.1  Hematocrit 39.0 - 52.0 % 46.0 38.5(L) 41.4  Platelets 150 - 400 K/uL 200 228 200     ABG    Component Value Date/Time   HCO3 23.4 05/25/2016 1720   TCO2 20.6 05/25/2016 1720   ACIDBASEDEF 0.4 05/25/2016 1720   O2SAT 92.9 05/25/2016 1720     CBG (last 3)   Recent Labs  10/21/17 1313 10/21/17 1426 10/21/17 1610  GLUCAP 350* 270* 111*     Imaging: Ct Abdomen Pelvis Wo Contrast  Result Date: March 04, 2017 CLINICAL DATA:  ACUTE PANCREATITISPATIENT DIAPHORETIC AND AMSPMH: DM, HTN, GERD ; Pancreatitis; Depression; Suicidal behavior; Substance abuse; PTSD ; Bipolar 1 disorder EXAM: CT ABDOMEN AND PELVIS WITHOUT CONTRAST TECHNIQUE: Multidetector CT imaging of the abdomen and pelvis was  performed following the standard protocol without IV contrast. COMPARISON:  03/12/2017 FINDINGS: Lower chest: Clear lung bases.  Heart normal in size. Hepatobiliary: Liver shows decreased attenuation diffusely. It is borderline enlarged. Gallbladder is not well-defined but is grossly unremarkable. No bile duct dilation. Pancreas: Pancreas shows relative increased attenuation with a poorly defined margin. There is significant surrounding inflammatory change and fluid in, but no discrete formed fluid collection is seen to suggest an abscess or pseudocyst. Spleen: Normal in size without focal abnormality. Adrenals/Urinary Tract: Adrenal glands are unremarkable. Kidneys are normal, without renal calculi, focal lesion, or hydronephrosis. Bladder is unremarkable. Stomach/Bowel: Stomach is within normal limits. Appendix appears normal. No evidence of bowel wall thickening, distention, or inflammatory changes. Vascular/Lymphatic: No significant vascular findings are present. No enlarged abdominal or pelvic lymph nodes. Reproductive: Prostate is unremarkable. Other: The  peripancreatic inflammatory change extends into the mid to upper abdominal mesenteric. There is also significant inflammatory change along the greater omentum. Ascites extends along the height anterior pararenal fascia into the pelvis. Musculoskeletal: No fracture or acute finding. No osteoblastic or osteolytic lesions. IMPRESSION: 1. Extensive inflammatory changes of acute pancreatitis. No formed fluid collection is seen to suggest an abscess or pseudocyst. Inflammation seen throughout the peripancreatic fat, mesenteries and along the greater omentum. Ascites is predominantly seen adjacent to the liver and spleen and tracking along the anterior pararenal fascia on the right into the pelvis. 2. Pancreatic necrosis and venous thrombosis cannot be assessed due to lack of intravenous contrast. 3. Borderline hepatomegaly and extensive hepatic steatosis.  Electronically Signed   By: Amie Portland M.D.   On: 06-01-2017 16:05   Dg Abdomen Acute W/chest  Result Date: 2017/06/01 CLINICAL DATA:  Abdominal pain and vomiting EXAM: DG ABDOMEN ACUTE W/ 1V CHEST COMPARISON:  Chest radiograph May 03, 2017; CT abdomen and pelvis March 12, 2017 FINDINGS: PA chest: Lungs are clear. Heart size and pulmonary vascularity are normal. No adenopathy. Supine and upright abdomen: There is moderate stool in colon. There is no appreciable bowel dilatation or air-fluid level to suggest obstruction. No free air. Small calcifications the pelvis are felt to be consistent with phleboliths. There is somewhat a amorphous calcification in the left mid abdomen of uncertain etiology. IMPRESSION: Somewhat amorphous calcification left mid abdomen of uncertain etiology. No bowel dilatation or air-fluid levels suggesting bowel obstruction. No free air. Lungs clear. Electronically Signed   By: Bretta Bang III M.D.   On: 06/01/17 08:20     Studies: CT abd/pelvis 5/17 >> acute pancreatitis, hepatic steatosis with hepatomegaly  Antibiotics:  Cultures: Blood 5/17 >>  Lines/tubes:  Events: 5/17 Admit  Summary: 46 yo male smoker with N/V and abdominal pain from alcohol induced pancreatitis with SIRS.  He also had hyperglycemia in setting of DM type II and acute renal failure.  He has hx of polysubstance abuse.  Assessment/plan:  Acute alcohol induced pancreatitis with SIRS, hypotension and sinus tachycardia. - continue IV fluids - advance diet when able - analgesics, antiemetics as needed - defer ABx for now since no evidence for pancreatic necrosis on CT abdomen - monitor hemodynamics >> defer CVL placement for now  DM type II with hyperglycemia in setting of acute pancreatitis. - continue insulin - hold outpt metformin, levemir  Acute renal failure >> baseline creatinine 0.66 from 05/03/17. Hypokalemia, hypomagnesemia. - monitor renal fx, urine outpt - continue  volume resuscitation - replace electrolytes as needed   Polycythemia. - likely related to hypovolemia - f/u CBC  Acute metabolic encephalopathy. Hx of ETOH and polysubstance abuse. Hx of Bipolar, PTSD. - thiamine, folic acid - monitor for withdrawal - prn ativan - might need precedex  - hold outpt seroquel, zoloft  Hx of HTN, HLD. - hold outpt lipitor, cozaar, minipress  DVT prophylaxis - lovenox  SUP - protonix Nutrition - NPO Goals of care - full code  D/w Dr. Jerral Ralph  CC time 42 minutes  Coralyn Helling, MD [facility] Pulmonary/Critical Care 06/01/17, 5:18 PM Pager:  [phone] After 3pm call: [phone]

## 2017-05-07 NOTE — ED Triage Notes (Addendum)
Arrived ems c/o n/v  abd pain,  [facility]   cbg per [facility] 412

## 2017-05-07 NOTE — Procedures (Signed)
[facility] PCCM CPR documentation  Called to bedside emergently for cardiac arrest around 1040 PM.  Bradycardia on my arrival, pulseless.  Nursing staff started CPR per standard ACLS protocol.  We administered epinephrine and performed pulse checks intermittently during the procedure.  I intubated him about 8 minutes into his cardiac arrest.  About 10 minutes into his cardiac arrest we had to defibrillate him because he was in Ventricular fibrillation.  We ended up defibrillating him for ventricular fibrillation again about 4 minutes later.  In total we gave him Epinephrine 6 times, amiodarone bolus 300mg , sodium bicarbonate 100mEq, calcium gluconate, 1 amp D50.    Total CPR time was 16 minutes.   Heber CarolinaBrent McQuaid, MD [place] PCCM Pager: [phone] Cell: [phone] After 3pm or if no response, call [phone]

## 2017-05-07 NOTE — ED Provider Notes (Addendum)
[facility]   CSN: [id number] Arrival date & time: 05/09/2017  0865     History   Chief Complaint Chief Complaint  Patient presents with  . Vomiting    HPI David Doyle is a 46 y.o. male.  HPI complaint of vomiting multiple times and left-sided upper quadrant pain and several episodes of vomiting onset approximate 6 AM today. He drank "too much" alcohol last night. No other associated symptoms. Brought by EMS.  Past Medical History:  Diagnosis Date  . Bipolar 1 disorder (HCC)   . Depression   . Diabetes mellitus without complication (HCC)   . GERD (gastroesophageal reflux disease)   . Hypertension   . Pancreatitis   . Portal vein thrombosis   . PTSD (post-traumatic stress disorder)   . Substance abuse   . Suicidal behavior     Patient Active Problem List   Diagnosis Date Noted  . Inguinal hernia   . Renal cyst   . Type 2 diabetes mellitus without complication (HCC)   . Benign essential HTN   . Depression   . Gastroesophageal reflux disease   . Pancreatitis 03/09/2017  . Heroin dependence (HCC) 03/15/2016  . 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 Opioid type dependence, continuous (HCC) 01/03/2016  . Alcohol dependence (HCC) 01/03/2016  . PTSD (post-traumatic stress disorder) 01/03/2016  . Suicidal ideation 01/03/2016  . Severe recurrent major depression without psychotic features (HCC) 01/03/2016  . BACK PAIN 07/15/2011    Past Surgical History:  Procedure Laterality Date  . ADENOIDECTOMY    . TONSILLECTOMY         Home Medications    Prior to Admission medications   Medication Sig Start Date End Date Taking? Authorizing Provider  atorvastatin (LIPITOR) 40 MG tablet Take 1 tablet (40 mg total) by mouth daily. For high cholesterol Patient not taking: Reported on 04/22/2017 03/13/17   Vassie Loll, MD  chlordiazePOXIDE (LIBRIUM) 25 MG capsule  50mg  PO TID x 1D, then 25-50mg  PO BID X 1D, then 25-50mg  PO QD X 1D 05/03/17   Charlynne Pander, MD  insulin detemir (LEVEMIR) 100 UNIT/ML injection Inject 0.1 mLs (10 Units total) into the skin at bedtime. Patient not taking: Reported on 04/22/2017 03/18/16   Thermon Leyland, NP  losartan (COZAAR) 50 MG tablet Take 1 tablet (50 mg total) by mouth daily. Patient not taking: Reported on 04/22/2017 03/13/17   Vassie Loll, MD  metFORMIN (GLUCOPHAGE) 500 MG tablet Take 1 tablet (500 mg total) by mouth 2 (two) times daily with a meal. For diabetes management Patient not taking: Reported on 04/22/2017 03/13/17   Vassie Loll, MD  omeprazole (PRILOSEC) 20 MG capsule Take 1 capsule (20 mg total) by mouth daily. 04/22/17   Tilden Fossa, MD  pantoprazole (PROTONIX) 40 MG tablet Take 1 tablet (40 mg total) by mouth 2 (two) times daily. For acid reflux 03/13/17   Vassie Loll, MD  permethrin Verner Mould) 5 % cream Apply to affected area once 05/03/17   Charlynne Pander, MD  prazosin (MINIPRESS) 2 MG capsule Take 1 capsule (2 mg total) by mouth at bedtime. Patient not taking: Reported on 04/22/2017 03/13/17   Vassie Loll, MD  promethazine (PHENERGAN) 25 MG tablet Take 1 tablet (25 mg total) by mouth every 6 (six) hours as needed for nausea or vomiting. 05/03/17   Charlynne Pander, MD  QUEtiapine (SEROQUEL) 100 MG tablet Take 1 tablet (100  mg total) by mouth at bedtime. 03/13/17   Vassie Loll, MD  sertraline (ZOLOFT) 100 MG tablet Take 1.5 tablets (150 mg total) by mouth daily. For depression Patient not taking: Reported on 04/22/2017 03/13/17   Vassie Loll, MD  sucralfate (CARAFATE) 1 g tablet Take 1 tablet (1 g total) by mouth 4 (four) times daily. 03/23/17   Lorre Nick, MD  thiamine 100 MG tablet Take 1 tablet (100 mg total) by mouth daily. Patient not taking: Reported on 04/22/2017 03/14/17   Vassie Loll, MD   Noncompliant with all of his medications. Presently Family History No family history on  file.  Social History Social History  Substance Use Topics  . Smoking status: Current Every Day Smoker    Packs/day: 2.00  . Smokeless tobacco: Never Used  . Alcohol use Yes     Comment: occasionally  Alcohol daily. Admits to multiple illicit drug use including heroin. No history of IV drug use   Allergies   Patient has no known allergies.   Review of Systems Review of Systems  Constitutional: Negative.   HENT: Negative.   Respiratory: Negative.   Cardiovascular: Negative.   Gastrointestinal: Positive for abdominal pain and vomiting.  Musculoskeletal: Negative.   Skin: Negative.   Allergic/Immunologic: Positive for immunocompromised state.       Diabetic, alcoholic  Neurological: Negative.   Psychiatric/Behavioral: Negative.   All other systems reviewed and are negative.    Physical Exam Updated Vital Signs BP (!) 142/98 (BP Location: Right Arm)   Pulse (!) 101   Temp 97.8 F (36.6 C) (Oral)   Resp (!) 26   Ht 5\' 11"  (1.803 m)   Wt 190 lb (86.2 kg)   SpO2 100%   BMI 26.50 kg/m   Physical Exam  Constitutional: He appears well-nourished.  Unkempt, appears uncomfortable  HENT:  Head: Normocephalic and atraumatic.  Mucous membranes dry  Eyes: Conjunctivae are normal. Pupils are equal, round, and reactive to light.  Neck: Neck supple. No tracheal deviation present. No thyromegaly present.  Cardiovascular: Normal rate and regular rhythm.   No murmur heard. Pulmonary/Chest: Effort normal and breath sounds normal.  Abdominal: Soft. Bowel sounds are normal. He exhibits no distension. There is tenderness.  Tender at left upper quadrant  Musculoskeletal: Normal range of motion. He exhibits no edema or tenderness.  Neurological: He is alert. Coordination normal.  Skin: Skin is warm and dry. No rash noted.  Psychiatric: He has a normal mood and affect.  Nursing note and vitals reviewed.    ED Treatments / Results  Labs (all labs ordered are listed, but only  abnormal results are displayed) Labs Reviewed  ETHANOL - Abnormal; Notable for the following:       Result Value   Alcohol, Ethyl (B) 125 (*)    All other components within normal limits  CBC WITH DIFFERENTIAL/PLATELET  COMPREHENSIVE METABOLIC PANEL  LIPASE, BLOOD   Results for orders placed or performed during the hospital encounter of May 08, 2017  Comprehensive metabolic panel  Result Value Ref Range   Sodium 131 (L) 135 - 145 mmol/L   Potassium 3.2 (L) 3.5 - 5.1 mmol/L   Chloride 88 (L) 101 - 111 mmol/L   CO2 21 (L) 22 - 32 mmol/L   Glucose, Bld 358 (H) 65 - 99 mg/dL   BUN 7 6 - 20 mg/dL   Creatinine, Ser 1.61 0.61 - 1.24 mg/dL   Calcium 8.8 (L) 8.9 - 10.3 mg/dL   Total Protein 7.3 6.5 - 8.1  g/dL   Albumin 3.9 3.5 - 5.0 g/dL   AST 51 (H) 15 - 41 U/L   ALT 42 17 - 63 U/L   Alkaline Phosphatase 217 (H) 38 - 126 U/L   Total Bilirubin 0.3 0.3 - 1.2 mg/dL   GFR calc non Af Amer >60 >60 mL/min   GFR calc Af Amer >60 >60 mL/min   Anion gap 22 (H) 5 - 15  Ethanol  Result Value Ref Range   Alcohol, Ethyl (B) 125 (H) <5 mg/dL  Lipase, blood  Result Value Ref Range   Lipase 602 (H) 11 - 51 U/L  Troponin I  Result Value Ref Range   Troponin I <0.03 <0.03 ng/mL  CBC with Differential/Platelet  Result Value Ref Range   WBC 22.4 (H) 4.0 - 10.5 K/uL   RBC 5.43 4.22 - 5.81 MIL/uL   Hemoglobin 17.5 (H) 13.0 - 17.0 g/dL   HCT 95.6 21.3 - 08.6 %   MCV 84.7 78.0 - 100.0 fL   MCH 32.2 26.0 - 34.0 pg   MCHC 38.0 (H) 30.0 - 36.0 g/dL   RDW 57.8 46.9 - 62.9 %   Platelets 200 150 - 400 K/uL   Neutrophils Relative % 88 %   Lymphocytes Relative 8 %   Monocytes Relative 4 %   Eosinophils Relative 0 %   Basophils Relative 0 %   Neutro Abs 19.7 (H) 1.7 - 7.7 K/uL   Lymphs Abs 1.8 0.7 - 4.0 K/uL   Monocytes Absolute 0.9 0.1 - 1.0 K/uL   Eosinophils Absolute 0.0 0.0 - 0.7 K/uL   Basophils Absolute 0.0 0.0 - 0.1 K/uL   RBC Morphology STOMATOCYTES   Magnesium  Result Value Ref Range    Magnesium 1.3 (L) 1.7 - 2.4 mg/dL  CBG monitoring, ED  Result Value Ref Range   Glucose-Capillary 329 (H) 65 - 99 mg/dL   Dg Chest 2 View  Result Date: 05/03/2017 CLINICAL DATA:  Middle CP this morning; no known cardiopulmonary problems; HTN; diabetic; smoker EXAM: CHEST  2 VIEW COMPARISON:  Chest x-ray dated 09/30/2014. FINDINGS: Heart size and mediastinal contours are normal. Lungs are clear. No pleural effusion or pneumothorax seen. Mild degenerative spurring within the thoracic spine. No acute or suspicious osseous finding. IMPRESSION: No active cardiopulmonary disease. No evidence of pneumonia or pulmonary edema. Electronically Signed   By: Bary Richard M.D.   On: 05/03/2017 11:36   Ct Angio Chest Pe W Or Wo Contrast  Result Date: 05/03/2017 CLINICAL DATA:  Dyspnea. Epigastric and lower chest pain. Alcohol dependence. Substance abuse. EXAM: CT ANGIOGRAPHY CHEST WITH CONTRAST TECHNIQUE: Multidetector CT imaging of the chest was performed using the standard protocol during bolus administration of intravenous contrast. Multiplanar CT image reconstructions and MIPs were obtained to evaluate the vascular anatomy. CONTRAST:  80 cc Isovue 370 IV. COMPARISON:  Chest radiograph from earlier today. FINDINGS: Cardiovascular: The study is low quality for the evaluation of pulmonary embolism, with evaluation of the segmental, subsegmental and lobar pulmonary arteries precluded by a combination of poor contrast opacification and respiratory motion artifact. There are no filling defects in the central pulmonary arteries to suggest acute pulmonary embolism. Great vessels are normal in course and caliber. Main pulmonary artery diameter 2.5 cm. Normal heart size. No significant pericardial fluid/thickening. Mediastinum/Nodes: No discrete thyroid nodules. Unremarkable esophagus. No pathologically enlarged axillary, mediastinal or hilar lymph nodes. Lungs/Pleura: No pneumothorax. No pleural effusion. No acute  consolidative airspace disease, lung masses or significant pulmonary nodules. Upper abdomen:  Diffuse hepatic steatosis. Musculoskeletal: No aggressive appearing focal osseous lesions. Moderate thoracic spondylosis. Review of the MIP images confirms the above findings. IMPRESSION: 1. Limited scan, see comments. No central pulmonary embolism. If the patient's respiratory symptoms persist, a V/Q scan or repeat chest CT angiogram could be obtained, as clinically warranted. 2. No acute pulmonary disease . 3. Diffuse hepatic steatosis. Electronically Signed   By: Delbert Phenix M.D.   On: 05/03/2017 13:23   Dg Abdomen Acute W/chest  Result Date: 05/17/2017 CLINICAL DATA:  Abdominal pain and vomiting EXAM: DG ABDOMEN ACUTE W/ 1V CHEST COMPARISON:  Chest radiograph May 03, 2017; CT abdomen and pelvis March 12, 2017 FINDINGS: PA chest: Lungs are clear. Heart size and pulmonary vascularity are normal. No adenopathy. Supine and upright abdomen: There is moderate stool in colon. There is no appreciable bowel dilatation or air-fluid level to suggest obstruction. No free air. Small calcifications the pelvis are felt to be consistent with phleboliths. There is somewhat a amorphous calcification in the left mid abdomen of uncertain etiology. IMPRESSION: Somewhat amorphous calcification left mid abdomen of uncertain etiology. No bowel dilatation or air-fluid levels suggesting bowel obstruction. No free air. Lungs clear. Electronically Signed   By: Bretta Bang III M.D.   On: May 17, 2017 08:20    EKG  EKG Interpretation None     ED ECG REPORT   Date: 05-17-17  Rate: 95  Rhythm: normal sinus rhythm  QRS Axis: normal  Intervals: normal and PR prolonged  ST/T Wave abnormalities: nonspecific T wave changes  Conduction Disutrbances:none  Narrative Interpretation:   Old EKG Reviewed: unchanged  I have personally reviewed the EKG tracing and agree with the computerized printout as noted.  Radiology No results  found. X-rays viewed by me Procedures Procedures (including critical care time)  Medications Ordered in ED Medications  metoCLOPramide (REGLAN) injection 5 mg (not administered)  sodium chloride 0.9 % bolus 2,000 mL (not administered)  ondansetron (ZOFRAN) injection 4 mg (4 mg Intravenous Given 05-17-2017 0735)   9:45 AM pain was transiently improved with intravenous morphine, requesting additional pain medicine for abdominal pain. IV hydromorphone ordered. Nausea has improved after treatment with intravenous antiemetics and with IV hydration. Patient continues to appear uncomfortable he is alert and ambulatory. IV magnesium and potassium supplementation ordered. Intravenous insulin drip ordered via glucose stabilizer under DKA order set. Dr.Ghimire, hospitalist consulted and except patient in transfer to [facility] stepdown unit  Initial Impression / Assessment and Plan / ED Course  I have reviewed the triage vital signs and the nursing notes.  Pertinent labs & imaging results that were available during my care of the patient were reviewed by me and considered in my medical decision making (see chart for details).       Final Clinical Impressions(s) / ED Diagnoses  Diagnosis #1 diabetic ketoacidosis #2 alcoholic pancreatitis Final diagnoses:  None  #3 hypomagnesemia #4 hypokalemia CRITICAL CARE Performed by: Doug Sou Total critical care time: 40 minutes Critical care time was exclusive of separately billable procedures and treating other patients. Critical care was necessary to treat or prevent imminent or life-threatening deterioration. Critical care was time spent personally by me on the following activities: development of treatment plan with patient and/or surrogate as well as nursing, discussions with consultants, evaluation of patient's response to treatment, examination of patient, obtaining history from patient or surrogate, ordering and performing treatments  and interventions, ordering and review of laboratory studies, ordering and review of radiographic studies, pulse oximetry and re-evaluation of  patient's condition.  New Prescriptions New Prescriptions   No medications on file     Doug SouJacubowitz, Elazar Argabright, MD 04/21/2017 0953  11:25 AM requesting more pain medicine. Patient is alert awake Glasgow coma score 15. Additional intravenous hydromorphone ordered    Doug SouJacubowitz, Johnwilliam Shepperson, MD 05/17/2017 1131

## 2017-05-07 NOTE — Progress Notes (Signed)
Inpatient Diabetes Program Recommendations  AACE/ADA: New Consensus Statement on Inpatient Glycemic Control (2015)  Target Ranges:  Prepandial:   less than 140 mg/dL      Peak postprandial:   less than 180 mg/dL (1-2 hours)      Critically ill patients:  140 - 180 mg/dL   Lab Results  Component Value Date   GLUCAP 350 (H) 05/10/2017   HGBA1C 6.5 (H) 03/09/2017    Review of Glycemic Control  2 admissions and 7 ED visits in past 6 months.  Diabetes history: DM2 Outpatient Diabetes medications: Levemir, metformin  Current orders for Inpatient glycemic control: IV insulin per DKA order set  Not taking insulin or OHAs at home. Will need case manager for assistance with obtaining PCP to manage DM.  Inpatient Diabetes Program Recommendations:    When criteria met for discontinuation of drip, give Levemir 2 hours prior to discontinuation of drip. 1.Levemir 16 units daily (0.2 units/kg dosing) 2. Novolog Moderate Correction Scale/ SSI (0-15 units) TID AC + HS   Will follow closely.  Thank you. Lorenda Peck, RD, LDN, CDE Inpatient Diabetes Coordinator [phone]

## 2017-05-07 NOTE — Progress Notes (Addendum)
Much more calmer after he received acetaminophen-sleeping comfortably. However is hypotensive and still tachycardic (EKG confirms sinus tachycardia). Have asked RN to give 1 L of IV fluid bolus, will get blood cultures. If continues to have hypotension or tachycardia, may need to consider critical care consult and continue to monitor closely for now.  Addendum Still hypotensive-tachycardic. Easily arousable Spoke with PCCM MD-Dr Summer-they will evaluate, he suggested low dose phenylephrine infusion if patient remains hypotensive Will bolus another 500 cc Check lactate, repeat CBC, blood cultures If remains hypotensive-with no etiology evident-will start empiric Abx (hx of drug use)

## 2017-05-07 NOTE — Procedures (Signed)
Intubation Procedure Note David Doyle [id number] Apr 22, 1971  Procedure: Intubation Indications: Airway protection and maintenance  Procedure Details Consent: Unable to obtain consent because of emergent medical necessity. Time Out: Verified patient identification, verified procedure, site/side was marked, verified correct patient position, special equipment/implants available, medications/allergies/relevent history reviewed, required imaging and test results available.  Performed  Drugs none> code DL x 1 with MAC 4 blade Grade 2 view 7.5 ET tube passed through cords under direct visualization Placement confirmed with bilateral breath sounds, positive EtCO2 change and smoke in tube   Evaluation Hemodynamic Status: BP stable throughout; O2 sats: currently acceptable Patient's Current Condition: stable Complications: No apparent complications Patient did tolerate procedure well. Chest X-ray ordered to verify placement.  CXR: pending.   Simonne Maffucci 05/19/2017

## 2017-05-07 NOTE — Progress Notes (Signed)
PULMONARY / CRITICAL CARE MEDICINE   Name: GARRIT MARROW MRN: [id number] DOB: 1971-08-31    ADMISSION DATE:  05/04/2017 CONSULTATION DATE:  04/24/2017  REFERRING MD:  Jerral Ralph  CHIEF COMPLAINT:  Lethargy, nausea, vomiting  HISTORY OF PRESENT ILLNESS:   46 y/o male with a history of polysubstance abuse with frequent visits to the ER presented on 5/17 with nausea, vomiting and abdominal pain.  He was noted to have pancreatitis and the hospitalist service was asked to admit.  However he developed worsening hypotension to pulmonary and critical care medicine was consulted for further evaluation.  He was admitted to the ICU and not long after arrival he required neosynephrine to maintain a blood pressure > 65 (MAP).  Throughout his ICU stay he was either lethargic or combative.  Around 2240 he had a PEA arrest> initially bradycardia, then PEA, then Vfib.  He received CPR x16 minutes.  He was found to be profoundly acidemic at this point.  He could not provide history.   PAST MEDICAL HISTORY :  He  has a past medical history of Bipolar 1 disorder (HCC); Depression; Diabetes mellitus without complication (HCC); GERD (gastroesophageal reflux disease); Hypertension; Pancreatitis; Portal vein thrombosis; PTSD (post-traumatic stress disorder); Substance abuse; and Suicidal behavior.  PAST SURGICAL HISTORY: He  has a past surgical history that includes Tonsillectomy and Adenoidectomy.  No Known Allergies  No current facility-administered medications on file prior to encounter.    Current Outpatient Prescriptions on File Prior to Encounter  Medication Sig  . atorvastatin (LIPITOR) 40 MG tablet Take 1 tablet (40 mg total) by mouth daily. For high cholesterol (Patient not taking: Reported on 05/17/2017)  . chlordiazePOXIDE (LIBRIUM) 25 MG capsule 50mg  PO TID x 1D, then 25-50mg  PO BID X 1D, then 25-50mg  PO QD X 1D (Patient not taking: Reported on 05/06/2017)  . insulin detemir (LEVEMIR) 100 UNIT/ML  injection Inject 0.1 mLs (10 Units total) into the skin at bedtime. (Patient not taking: Reported on 04/22/2017)  . losartan (COZAAR) 50 MG tablet Take 1 tablet (50 mg total) by mouth daily. (Patient not taking: Reported on 04/22/2017)  . metFORMIN (GLUCOPHAGE) 500 MG tablet Take 1 tablet (500 mg total) by mouth 2 (two) times daily with a meal. For diabetes management (Patient not taking: Reported on 04/22/2017)  . omeprazole (PRILOSEC) 20 MG capsule Take 1 capsule (20 mg total) by mouth daily. (Patient not taking: Reported on 05/01/2017)  . pantoprazole (PROTONIX) 40 MG tablet Take 1 tablet (40 mg total) by mouth 2 (two) times daily. For acid reflux (Patient not taking: Reported on 04/30/2017)  . permethrin (ELIMITE) 5 % cream Apply to affected area once (Patient not taking: Reported on 04/23/2017)  . prazosin (MINIPRESS) 2 MG capsule Take 1 capsule (2 mg total) by mouth at bedtime. (Patient not taking: Reported on 04/22/2017)  . promethazine (PHENERGAN) 25 MG tablet Take 1 tablet (25 mg total) by mouth every 6 (six) hours as needed for nausea or vomiting. (Patient not taking: Reported on 04/25/2017)  . QUEtiapine (SEROQUEL) 100 MG tablet Take 1 tablet (100 mg total) by mouth at bedtime. (Patient not taking: Reported on 04/22/2017)  . sertraline (ZOLOFT) 100 MG tablet Take 1.5 tablets (150 mg total) by mouth daily. For depression (Patient not taking: Reported on 04/22/2017)  . sucralfate (CARAFATE) 1 g tablet Take 1 tablet (1 g total) by mouth 4 (four) times daily. (Patient not taking: Reported on 05/01/2017)  . thiamine 100 MG tablet Take 1 tablet (100 mg total)  by mouth daily. (Patient not taking: Reported on 04/22/2017)    FAMILY HISTORY/SOCIAL HISTORY/REVIEW OF SYSTEMS:   Cannot obtain due to intubation  SUBJECTIVE:  As above  VITAL SIGNS: BP (!) 75/43   Pulse (!) 151 Comment: Dr Ethelda Chick aware, pt remains stable for transfer.  Temp 97.2 F (36.2 C) (Axillary)   Resp (!) 32   Ht 5\' 11"  (1.803 m)   Wt  190 lb (86.2 kg)   SpO2 95%   BMI 26.50 kg/m   HEMODYNAMICS:    VENTILATOR SETTINGS:    INTAKE / OUTPUT: I/O last 3 completed shifts: In: 4237.9 [I.V.:654.6; IV Piggyback:3583.3] Out: 45 [Urine:45]  PHYSICAL EXAMINATION: General:  Acutely ill-appearing in bed Neuro:  Does not follow commands, GCS 3 HEENT:  Normocephalic atraumatic, endotracheal tube in place Cardiovascular:  Tachycardic, regular rhythm, no murmurs gallops or rubs Lungs:  Ventilator supported breathing, clear to auscultation Abdomen:  Markedly distended, no bowel sounds, tight to touch Musculoskeletal:  Normal bulk and tone Skin:  Tattoos noted, no rash or skin breakdown  LABS:  BMET  Recent Labs Lab 05-23-17 0718 05-23-17 1417 2017/05/23 1749  NA 131* 138 141  K 3.2* 3.4* 3.4*  CL 88* 105 113*  CO2 21* 13* 16*  BUN 7 9 9   CREATININE 0.78 1.35* 1.92*  GLUCOSE 358* 250* 100*    Electrolytes  Recent Labs Lab 05/23/2017 0718 05/23/2017 0825 2017/05/23 0839 23-May-2017 1417 May 23, 2017 1749  CALCIUM 8.8*  --   --  7.0* 6.6*  MG  --   --  1.3* 1.8  --   PHOS  --  3.1  --   --   --     CBC  Recent Labs Lab 05/03/17 1106 May 23, 2017 0825 05-23-17 1749  WBC 6.1 22.4* 12.5*  HGB 14.1 17.5* 18.8*  HCT 38.5* 46.0 54.1*  PLT 228 200 149*    Coag's No results for input(s): APTT, INR in the last 168 hours.  Sepsis Markers  Recent Labs Lab 05-23-2017 1749  LATICACIDVEN 6.9*  PROCALCITON 17.77    ABG  Recent Labs Lab 05/23/17 1713 05/23/17 2312  PHART 7.195* BELOW REPORTABLE RANGE.  PCO2ART 33.8 32.3  PO2ART 81.1* 174*    Liver Enzymes  Recent Labs Lab 05/03/17 1106 2017/05/23 0718  AST 39 51*  ALT 14* 42  ALKPHOS 124 217*  BILITOT 1.6* 0.3  ALBUMIN 3.6 3.9    Cardiac Enzymes  Recent Labs Lab 05/23/2017 0718  TROPONINI <0.03    Glucose  Recent Labs Lab May 23, 2017 1822 2017-05-23 1923 05/23/17 2018 05-23-2017 2121 May 23, 2017 2219 05-23-2017 2251  GLUCAP 128* 184* 174* 168*  111* 91    Imaging Ct Abdomen Pelvis Wo Contrast  Result Date: 05/23/2017 CLINICAL DATA:  ACUTE PANCREATITISPATIENT DIAPHORETIC AND AMSPMH: DM, HTN, GERD ; Pancreatitis; Depression; Suicidal behavior; Substance abuse; PTSD ; Bipolar 1 disorder EXAM: CT ABDOMEN AND PELVIS WITHOUT CONTRAST TECHNIQUE: Multidetector CT imaging of the abdomen and pelvis was performed following the standard protocol without IV contrast. COMPARISON:  03/12/2017 FINDINGS: Lower chest: Clear lung bases.  Heart normal in size. Hepatobiliary: Liver shows decreased attenuation diffusely. It is borderline enlarged. Gallbladder is not well-defined but is grossly unremarkable. No bile duct dilation. Pancreas: Pancreas shows relative increased attenuation with a poorly defined margin. There is significant surrounding inflammatory change and fluid in, but no discrete formed fluid collection is seen to suggest an abscess or pseudocyst. Spleen: Normal in size without focal abnormality. Adrenals/Urinary Tract: Adrenal glands are unremarkable. Kidneys are normal, without  renal calculi, focal lesion, or hydronephrosis. Bladder is unremarkable. Stomach/Bowel: Stomach is within normal limits. Appendix appears normal. No evidence of bowel wall thickening, distention, or inflammatory changes. Vascular/Lymphatic: No significant vascular findings are present. No enlarged abdominal or pelvic lymph nodes. Reproductive: Prostate is unremarkable. Other: The peripancreatic inflammatory change extends into the mid to upper abdominal mesenteric. There is also significant inflammatory change along the greater omentum. Ascites extends along the height anterior pararenal fascia into the pelvis. Musculoskeletal: No fracture or acute finding. No osteoblastic or osteolytic lesions. IMPRESSION: 1. Extensive inflammatory changes of acute pancreatitis. No formed fluid collection is seen to suggest an abscess or pseudocyst. Inflammation seen throughout the peripancreatic  fat, mesenteries and along the greater omentum. Ascites is predominantly seen adjacent to the liver and spleen and tracking along the anterior pararenal fascia on the right into the pelvis. 2. Pancreatic necrosis and venous thrombosis cannot be assessed due to lack of intravenous contrast. 3. Borderline hepatomegaly and extensive hepatic steatosis. Electronically Signed   By: Amie Portland M.D.   On: 05/09/2017 16:05   Dg Abdomen Acute W/chest  Result Date: 04/24/2017 CLINICAL DATA:  Abdominal pain and vomiting EXAM: DG ABDOMEN ACUTE W/ 1V CHEST COMPARISON:  Chest radiograph May 03, 2017; CT abdomen and pelvis March 12, 2017 FINDINGS: PA chest: Lungs are clear. Heart size and pulmonary vascularity are normal. No adenopathy. Supine and upright abdomen: There is moderate stool in colon. There is no appreciable bowel dilatation or air-fluid level to suggest obstruction. No free air. Small calcifications the pelvis are felt to be consistent with phleboliths. There is somewhat a amorphous calcification in the left mid abdomen of uncertain etiology. IMPRESSION: Somewhat amorphous calcification left mid abdomen of uncertain etiology. No bowel dilatation or air-fluid levels suggesting bowel obstruction. No free air. Lungs clear. Electronically Signed   By: Bretta Bang III M.D.   On: 04/25/2017 08:20     STUDIES:  May 17 CT abdomen pelvis with acute pancreatitis and hepatic steatosis  CULTURES: 04/21/2017 blood culture  ANTIBIOTICS: 05/15/2017 vancomycin 05-24-2017 meropenem  SIGNIFICANT EVENTS: 05/03/2017 admission   LINES/TUBES: 04/24/2017 endotracheal tube 05/09/2017 left subclavian vein central line  DISCUSSION: 46 year old male with a past medical history significant for polysubstance abuse was admitted on 05/02/2017 in the setting of shock, acute severe pancreatitis, non-gap metabolic acidosis, hyperglycemia, and acute encephalopathy. Despite volume resuscitation his shock worsened and  he ultimately developed severe acidosis leading to a cardiac arrest late in the evening on 05/04/2017. As of early a.m. 05/24/17 he is in severe shock. Overall his condition is critical and his prognosis is poor.  ASSESSMENT / PLAN:  PULMONARY A: Acute respiratory failure with hypoxemia P:   Full mechanical ventilator support Daily wakeup assessment Daily spontaneous breathing trial X-ray postintubation ABG Ventilator associated pneumonia prevention protocol  CARDIOVASCULAR A:  Cardiac arrest presumably due to severe metabolic acidosis, possible cardiomyopathy related to polysubstance abuse Ventricular fibrillation Intermittent bradycardia ?abdominal compartment syndrome > too unstable for general anesthesia/OR P:  Telemetry monitoring Norepinephrine, vasopressin, epinephrine titrated to matinain mean arterial pressure greater than 65 CVP monitoring Check echocardiogram Serial lactic acid measurements Measure bladder pressure  RENAL A:   Acute kidney injury Non-gap metabolic acidosis presumably due to lactic acidosis the setting of hyperglycemia cannot rule out DKA Hypokalemia P:   Check serum ketones Sodium bicarbonate infusion Serial ABGs Consider CVVHD if hemodynamics improve Monitor BMET and UOP Replace electrolytes as needed Check Mag/phos  GASTROINTESTINAL A:   Acute severe pancreatitis Hepatic steatosis, 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 P:   OG to suction now Start tube feedings tomorrow if stable Pepcid for stress ulcer prophylaxis  HEMATOLOGIC A:   Thrombocytopenia, not bleeding P:  CBC when necessary Monitor for bleeding  INFECTIOUS A:   Possible sepsis related to severe pancreatitis (though necrosis not clearly seen on CT abdomen) P:   Considering severity of current illness will add empiric broad-spectrum antibiotics Follow-up cultures  ENDOCRINE A:   Hyperglycemia with anion gap elevation   P:   Check serum ketones (beta  hydroxybutyrate) Stop insulin drip Use sliding scale insulin per every 4 hours monitoring protocol Restart insulin drip if serum ketones positive  NEUROLOGIC A:   Acute encephalopathy, high likelihood of anoxic brain injury Alcohol abuse P:   RASS goal: 0 to -1 Intermittent fentanyl Too unstable for hypothermia protocol Thiamine, folate  Code FULL   FAMILY  - Updates: called grandmother early AM 5/18 for update, counseled that he may not make it through the night  - Inter-disciplinary family meet or Palliative Care meeting due by:  Day 7  My cc time 90 minutes  Heber CarolinaBrent Travin Marik, MD [facility] Pager: [phone] Cell: [phone] After 3pm or if no response, call [phone]  06/21/2017, 11:40 PM

## 2017-05-07 NOTE — H&P (Addendum)
HISTORY AND PHYSICAL       PATIENT DETAILS Name: Margarette Canada Age: 46 y.o. Sex: male Date of Birth: October 12, 1971 Admit Date: 04/26/2017 PCP:[facility]   Patient coming from: Homeless   CHIEF COMPLAINT:  Abdominal abdominal pain 2 days  HPI: KYROLLOS CORDELL is a 46 y.o. male with medical history significant of polysubstance abuse (alcohol, cocaine, heroin, marijuana), type 2 diabetes, hypertension-noncompliance to medication-presented to [facility] [place] for evaluation of abdominal pain. Per patient, for the past 2 days he slowly developing mid abdominal pain, that has gradually worsened. He rates the pain as 10/10 at its worse, with radiation to the back. There is no particular aggravating or relieving factors. Pain was associated with nausea but no vomiting. There is no fever or diarrhea. He was evaluated in the emergency room, where he was found to have pancreatitis, and was found to have probable DKA or alcoholic ketoacidosis and subsequently referred to [facility] for further evaluation and treatment.  Per patient, he drinks around 15 (12 ounces) bottles of beer on a daily basis. He also frequently uses cocaine (snorts/smokes) heroin and marijuana multiple times a week. He denies IV drug use.  Note-patient is a poor historian-during my evaluation-he was very restless-required numerous attempts to get  relevant history out of him.   ED Course:  In the ED patient was found to have a anion gap of 22 with a CBG of 358-he was also found to have a significantly elevated lipase of 602. He was started on IV fluids, IV insulin and subsequently transferred to [facility] for inpatient evaluation.  Note: Lives at: Homeless Mobility:  Independent Chronic Indwelling Foley:no   REVIEW OF SYSTEMS:  Constitutional:   No  Fevers, chills, fatigue.  HEENT:    No headaches, Dysphagia,Tooth/dental problems,Sore  throat  Cardio-vascular: No chest pain,Orthopnea, PND,lower extremity edema, anasarca, palpitations  GI:  No heartburn, indigestion, diarrhea, melena or hematochezia  Resp: No shortness of breath, cough, hemoptysis,plueritic chest pain.   Skin:  No rash or lesions.  GU:  No dysuria, change in color of urine, no urgency or frequency.  No flank pain.  Musculoskeletal: No joint pain or swelling.      Endocrine: No heat intolerance, no cold intolerance, no polyuria, no polydipsia  Psych: Restless, anxious and pacing back and forth from the chair to the bed.   ALLERGIES:  No Known Allergies  PAST MEDICAL HISTORY: Past Medical History:  Diagnosis Date  . Bipolar 1 disorder (HCC)   . Depression   . Diabetes mellitus without complication (HCC)   . GERD (gastroesophageal reflux disease)   . Hypertension   . Pancreatitis   . Portal vein thrombosis   . PTSD (post-traumatic stress disorder)   . Substance abuse   . Suicidal behavior     PAST SURGICAL HISTORY: Past Surgical History:  Procedure Laterality Date  . ADENOIDECTOMY    . TONSILLECTOMY      MEDICATIONS AT HOME: Prior to Admission medications   Medication Sig Start Date End Date Taking? Authorizing Provider  atorvastatin (LIPITOR) 40 MG tablet Take 1 tablet (40 mg total) by mouth daily. For high cholesterol Patient not taking: Reported on 05/06/2017 03/13/17   Vassie Loll, MD  chlordiazePOXIDE (LIBRIUM) 25 MG capsule 50mg  PO TID x 1D, then 25-50mg  PO BID X 1D, then 25-50mg  PO QD X 1D Patient not taking: Reported on 05/07/2017 05/03/17  Charlynne PanderYao, David Hsienta, MD  insulin detemir (LEVEMIR) 100 UNIT/ML injection Inject 0.1 mLs (10 Units total) into the skin at bedtime. Patient not taking: Reported on 04/22/2017 03/18/16   Thermon Leylandavis, Laura A, NP  losartan (COZAAR) 50 MG tablet Take 1 tablet (50 mg total) by mouth daily. Patient not taking: Reported on 04/22/2017 03/13/17   Vassie LollMadera, Carlos, MD  metFORMIN (GLUCOPHAGE) 500 MG  tablet Take 1 tablet (500 mg total) by mouth 2 (two) times daily with a meal. For diabetes management Patient not taking: Reported on 04/22/2017 03/13/17   Vassie LollMadera, Carlos, MD  omeprazole (PRILOSEC) 20 MG capsule Take 1 capsule (20 mg total) by mouth daily. Patient not taking: Reported on 05/18/2017 04/22/17   Tilden Fossaees, Elizabeth, MD  pantoprazole (PROTONIX) 40 MG tablet Take 1 tablet (40 mg total) by mouth 2 (two) times daily. For acid reflux Patient not taking: Reported on 05/19/2017 03/13/17   Vassie LollMadera, Carlos, MD  permethrin (ELIMITE) 5 % cream Apply to affected area once Patient not taking: Reported on 04/24/2017 05/03/17   Charlynne PanderYao, David Hsienta, MD  prazosin (MINIPRESS) 2 MG capsule Take 1 capsule (2 mg total) by mouth at bedtime. Patient not taking: Reported on 04/22/2017 03/13/17   Vassie LollMadera, Carlos, MD  promethazine (PHENERGAN) 25 MG tablet Take 1 tablet (25 mg total) by mouth every 6 (six) hours as needed for nausea or vomiting. Patient not taking: Reported on 05/06/2017 05/03/17   Charlynne PanderYao, David Hsienta, MD  QUEtiapine (SEROQUEL) 100 MG tablet Take 1 tablet (100 mg total) by mouth at bedtime. Patient not taking: Reported on 04/21/2017 03/13/17   Vassie LollMadera, Carlos, MD  sertraline (ZOLOFT) 100 MG tablet Take 1.5 tablets (150 mg total) by mouth daily. For depression Patient not taking: Reported on 04/22/2017 03/13/17   Vassie LollMadera, Carlos, MD  sucralfate (CARAFATE) 1 g tablet Take 1 tablet (1 g total) by mouth 4 (four) times daily. Patient not taking: Reported on 04/21/2017 03/23/17   Lorre NickAllen, Anthony, MD  thiamine 100 MG tablet Take 1 tablet (100 mg total) by mouth daily. Patient not taking: Reported on 04/22/2017 03/14/17   Vassie LollMadera, Carlos, MD    FAMILY HISTORY: No family history of  pancreatic cancer.  SOCIAL HISTORY:  reports that he has been smoking.  He has been smoking about 2.00 packs per day. He has never used smokeless tobacco. He reports that he drinks alcohol. He reports that he uses drugs, including Cocaine and  IV.  PHYSICAL EXAM: Blood pressure 99/67, pulse (!) 151, temperature 97.6 F (36.4 C), temperature source Oral, resp. rate (!) 28, height 5\' 11"  (1.803 m), weight 86.2 kg (190 lb), SpO2 98 %.  General appearance: Appears disheveled. Awake, alert, restless-in mild distress due to abdominal pain. Eyes:, pupils equally reactive to light and accomodation,no scleral icterus. HEENT: Atraumatic and Normocephalic Neck: supple, no JVD. No cervical lymphadenopathy.  Resp:Good air entry bilaterally CVS: S1 S2 regular-but tachycardic GI: Bowel sounds present, tender in the epigastric area without any peritoneal signs. Extremities: B/L Lower Ext shows no edema, both legs are warm to touch Neurology: Non focal Psychiatric:  Alert and oriented x 3. Musculoskeletal:No digital cyanosis Skin:No Rash, warm and dry Wounds:N/A  LABS ON ADMISSION:  I have personally reviewed following labs and imaging studies  CBC:  Recent Labs Lab 05/03/17 1106 05/02/2017 0825  WBC 6.1 22.4*  NEUTROABS 2.6 19.7*  HGB 14.1 17.5*  HCT 38.5* 46.0  MCV 86.7 84.7  PLT 228 200    Basic Metabolic Panel:  Recent Labs Lab 05/03/17 1106  05/01/2017 0718 05/02/2017 0825 05/12/2017 0839  NA 127* 131*  --   --   K 4.7 3.2*  --   --   CL 87* 88*  --   --   CO2 26 21*  --   --   GLUCOSE 172* 358*  --   --   BUN 7 7  --   --   CREATININE 0.66 0.78  --   --   CALCIUM 8.8* 8.8*  --   --   MG  --   --   --  1.3*  PHOS  --   --  3.1  --     GFR: Estimated Creatinine Clearance: 124.2 mL/min (by C-G formula based on SCr of 0.78 mg/dL).  Liver Function Tests:  Recent Labs Lab 05/03/17 1106 04/30/2017 0718  AST 39 51*  ALT 14* 42  ALKPHOS 124 217*  BILITOT 1.6* 0.3  PROT 6.6 7.3  ALBUMIN 3.6 3.9    Recent Labs Lab 05/03/17 1106 05/05/2017 0718  LIPASE 29 602*   No results for input(s): AMMONIA in the last 168 hours.  Coagulation Profile: No results for input(s): INR, PROTIME in the last 168 hours.  Cardiac  Enzymes:  Recent Labs Lab 05/18/2017 0718  TROPONINI <0.03    BNP (last 3 results) No results for input(s): PROBNP in the last 8760 hours.  HbA1C: No results for input(s): HGBA1C in the last 72 hours.  CBG:  Recent Labs Lab 05/03/17 1056 05/21/2017 0930 04/30/2017 1051 05/06/2017 1202 05/15/2017 1313  GLUCAP 183* 329* 309* 335* 350*    Lipid Profile: No results for input(s): CHOL, HDL, LDLCALC, TRIG, CHOLHDL, LDLDIRECT in the last 72 hours.  Thyroid Function Tests: No results for input(s): TSH, T4TOTAL, FREET4, T3FREE, THYROIDAB in the last 72 hours.  Anemia Panel: No results for input(s): VITAMINB12, FOLATE, FERRITIN, TIBC, IRON, RETICCTPCT in the last 72 hours.  Urine analysis:    Component Value Date/Time   COLORURINE YELLOW 04/22/2017 0839   APPEARANCEUR CLEAR 04/22/2017 0839   LABSPEC 1.009 04/22/2017 0839   PHURINE 6.0 04/22/2017 0839   GLUCOSEU >=500 (A) 04/22/2017 0839   HGBUR NEGATIVE 04/22/2017 0839   BILIRUBINUR NEGATIVE 04/22/2017 0839   KETONESUR NEGATIVE 04/22/2017 0839   PROTEINUR NEGATIVE 04/22/2017 0839   UROBILINOGEN 1.0 06/23/2011 1143   NITRITE NEGATIVE 04/22/2017 0839   LEUKOCYTESUR NEGATIVE 04/22/2017 0839    Sepsis Labs: Lactic Acid, Venous    Component Value Date/Time   LATICACIDVEN 1.1 03/12/2017 1951     Microbiology: No results found for this or any previous visit (from the past 240 hour(s)).    RADIOLOGIC STUDIES ON ADMISSION: Dg Abdomen Acute W/chest  Result Date: 05/20/2017 CLINICAL DATA:  Abdominal pain and vomiting EXAM: DG ABDOMEN ACUTE W/ 1V CHEST COMPARISON:  Chest radiograph May 03, 2017; CT abdomen and pelvis March 12, 2017 FINDINGS: PA chest: Lungs are clear. Heart size and pulmonary vascularity are normal. No adenopathy. Supine and upright abdomen: There is moderate stool in colon. There is no appreciable bowel dilatation or air-fluid level to suggest obstruction. No free air. Small calcifications the pelvis are felt to  be consistent with phleboliths. There is somewhat a amorphous calcification in the left mid abdomen of uncertain etiology. IMPRESSION: Somewhat amorphous calcification left mid abdomen of uncertain etiology. No bowel dilatation or air-fluid levels suggesting bowel obstruction. No free air. Lungs clear. Electronically Signed   By: Bretta Bang III M.D.   On: 04/22/2017 08:20    I have personally  reviewed images of chest xray   EKG:  Personally reviewed. NSR  ASSESSMENT AND PLAN: Systemic inflammatory response syndrome: Secondary to DKA, pancreatitis and possible drug withdrawal syndrome. Continue supportive care with IV fluids, Ativan as needed. Monitor in stepdown.  Diabetic ketoacidosis: Probably has ketoacidosis from uncontrolled diabetes and alcoholic ketoacidosis. Currently on IV insulin per protocol. We will repeat chemistry panel, check serum osmolality-and continue to adjust insulin infusion until anion gap closes. Since patient has ongoing pancreatitis-if and when he is transitioned to subcutaneous insulin, may need to be given low doses of Lantus/SSI as he will likely be nothing by mouth for a few more days.  Hypokalemia/hypomagnesemia: Replete and recheck-likely in the setting of alcohol use.  Acute pancreatitis: Likely alcoholic pancreatitis-belly is very tender on exam-he is tachycardic-we will check a CT of the abdomen to see if he has necrosis or other complications. Keep nothing by mouth, as needed narcotics and other supportive measures. Check lipid panel in the morning  Alcohol abuse/polysubstance abuse (cocaine, heroin and marijuana): He already appears to be very restless, and is pacing back and forth from the bed to bedside chair. I suspect he is already withdrawing, will place on Ativan per stepdown protocol. We will monitor him closely and a stepdown unit, if he continues to be tachycardic and restless-in spite of treatment with Ativan, he may need to be placed on a  Precedex infusion. Once he is clinically better, he will require counseling.  GERD: Continue PPI  Homelessness   Further plan will depend as patient's clinical course evolves and further radiologic and laboratory data become available. Patient will be monitored closely.  Above noted plan was discussed with patient face to face at bedside,he was in agreement.   CONSULTS: None  DVT Prophylaxis: Prophylactic Lovenox  Code Status: Full Code  Disposition Plan:  Discharge back home  in 3-4 days, depending on clinical course  Admission status:  Inpatient going to SDU  The medical decision making on this patient was of high complexity and the patient is at high risk for clinical deterioration, therefore this is a level 3 visit.  Total time spent  55 minutes.Greater than 50% of this time was spent in counseling, explanation of diagnosis, planning of further management, and coordination of care.  Jeoffrey Massed [facility] Pager [phone]  If 7PM-7AM, please contact night-coverage [email or web address] Password [email or web address] 2017/05/27, 2:02 PM

## 2017-05-07 NOTE — Progress Notes (Signed)
eLink Physician-Brief Progress Note Patient Name: Margarette CanadaJose A II Jobson DOB: [birth year] MRN: [id number]   Date of Service  May 06, 2017  HPI/Events of Note  Hypotension - BP = 74/36 with MAP = 46. Lactic Acid = 6.9.  eICU Interventions  Will order: 1. Bolus with 0.9 NaCl 1 liter IV over 1 hour now. 2. Phenylephrine IV infusion. Titrate to MAP >= 65.      Intervention Category Major Interventions: Hypotension - evaluation and management  Lenell AntuSommer,Steven Eugene May 06, 2017, 6:58 PM

## 2017-05-07 NOTE — ED Notes (Signed)
Asked to do redraw of blood samples. Samples obtained and sent to lab. Urine sample taken to lab as well.

## 2017-05-07 NOTE — ED Notes (Signed)
This rn calls Legrand Ramsony rPH at [facility], states insulin must run alone, but potassium drip can be run with protonix.

## 2017-05-07 NOTE — ED Notes (Signed)
Report called to Echohristy, Charity fundraiserN at [facility].

## 2017-05-08 DIAGNOSIS — I469 Cardiac arrest, cause unspecified: Secondary | ICD-10-CM

## 2017-05-08 LAB — COMPREHENSIVE METABOLIC PANEL
ALT: 118 U/L — ABNORMAL HIGH (ref 17–63)
AST: 570 U/L — ABNORMAL HIGH (ref 15–41)
Albumin: 1.3 g/dL — ABNORMAL LOW (ref 3.5–5.0)
Alkaline Phosphatase: 97 U/L (ref 38–126)
Anion gap: 21 — ABNORMAL HIGH (ref 5–15)
BUN: 10 mg/dL (ref 6–20)
CO2: 9 mmol/L — ABNORMAL LOW (ref 22–32)
Calcium: 8.5 mg/dL — ABNORMAL LOW (ref 8.9–10.3)
Chloride: 115 mmol/L — ABNORMAL HIGH (ref 101–111)
Creatinine, Ser: 2.92 mg/dL — ABNORMAL HIGH (ref 0.61–1.24)
GFR calc Af Amer: 28 mL/min — ABNORMAL LOW (ref >60)
GFR calc non Af Amer: 24 mL/min — ABNORMAL LOW (ref >60)
Glucose, Bld: 246 mg/dL — ABNORMAL HIGH (ref 65–99)
Potassium: 4.5 mmol/L (ref 3.5–5.1)
Sodium: 145 mmol/L (ref 135–145)
Total Bilirubin: 1 mg/dL (ref 0.3–1.2)
Total Protein: <3 g/dL — ABNORMAL LOW (ref 6.5–8.1)

## 2017-05-08 LAB — CORTISOL: Cortisol, Plasma: 72.5 ug/dL

## 2017-05-08 LAB — CBC
HCT: 43.2 % (ref 39.0–52.0)
Hemoglobin: 13.9 g/dL (ref 13.0–17.0)
MCH: 31.7 pg (ref 26.0–34.0)
MCHC: 32.2 g/dL (ref 30.0–36.0)
MCV: 98.6 fL (ref 78.0–100.0)
Platelets: 94 10*3/uL — ABNORMAL LOW (ref 150–400)
RBC: 4.38 MIL/uL (ref 4.22–5.81)
RDW: 15.5 % (ref 11.5–15.5)
WBC: 10.3 10*3/uL (ref 4.0–10.5)

## 2017-05-08 LAB — AMMONIA: Ammonia: 283 umol/L — ABNORMAL HIGH (ref 9–35)

## 2017-05-08 LAB — LACTIC ACID, PLASMA: Lactic Acid, Venous: 16.1 mmol/L (ref 0.5–1.9)

## 2017-05-08 LAB — PHOSPHORUS: Phosphorus: 11.6 mg/dL — ABNORMAL HIGH (ref 2.5–4.6)

## 2017-05-08 LAB — MAGNESIUM: MAGNESIUM: 2.4 mg/dL (ref 1.7–2.4)

## 2017-05-08 LAB — HEMOGLOBIN A1C
HEMOGLOBIN A1C: 8.7 % — AB (ref 4.8–5.6)
MEAN PLASMA GLUCOSE: 203 mg/dL

## 2017-05-08 LAB — TROPONIN I: TROPONIN I: 0.12 ng/mL — AB (ref <0.03)

## 2017-05-08 MED ORDER — DEXTROSE 5 % IV SOLN
0.5000 ug/min | INTRAVENOUS | Status: DC
  Administered 2017-05-08: 10 ug/min via INTRAVENOUS
  Filled 2017-05-08: qty 4

## 2017-05-08 MED ORDER — VANCOMYCIN HCL 10 G IV SOLR
1500.0000 mg | Freq: Once | INTRAVENOUS | Status: DC
  Filled 2017-05-08: qty 1500

## 2017-05-08 MED ORDER — SODIUM CHLORIDE 0.9 % IV SOLN
2.0000 g | Freq: Two times a day (BID) | INTRAVENOUS | Status: DC
  Filled 2017-05-08: qty 2

## 2017-05-08 MED ORDER — EPINEPHRINE PF 1 MG/10ML IJ SOSY
0.1000 mg | PREFILLED_SYRINGE | Freq: Once | INTRAMUSCULAR | Status: AC
  Administered 2017-05-08: 0.1 mg via INTRAVENOUS

## 2017-05-08 MED ORDER — SODIUM BICARBONATE 8.4 % IV SOLN
INTRAVENOUS | Status: AC
  Administered 2017-05-08: 01:00:00
  Filled 2017-05-08: qty 50

## 2017-05-08 MED ORDER — VASOPRESSIN 20 UNIT/ML IV SOLN
0.0300 [IU]/min | INTRAVENOUS | Status: DC
  Administered 2017-05-08: 0.03 [IU]/min via INTRAVENOUS
  Filled 2017-05-08: qty 2

## 2017-05-08 MED ORDER — INSULIN ASPART 100 UNIT/ML ~~LOC~~ SOLN: 0.0000 [IU] | SUBCUTANEOUS | Status: DC

## 2017-05-08 MED ORDER — HYDROCORTISONE NA SUCCINATE PF 100 MG IJ SOLR: 50.0000 mg | Freq: Four times a day (QID) | INTRAMUSCULAR | Status: DC

## 2017-05-08 MED FILL — Medication: Qty: 2 | Status: AC

## 2017-05-12 LAB — CULTURE, BLOOD (ROUTINE X 2)
Culture: NO GROWTH
Culture: NO GROWTH
SPECIAL REQUESTS: ADEQUATE
Special Requests: ADEQUATE

## 2017-05-22 ENCOUNTER — Telehealth: Payer: Self-pay

## 2017-05-22 NOTE — Progress Notes (Signed)
Patient going through withdrawals and pulling tubing, equipment, and lines. Patient had mitts placed and PRN medication. These interventions were unsuccessful. E-link was able to view patient via camera in the patient's room and the request for restraints was granted. Nurse and nurse tech place restraints on patient and patient seemed calm. Nurse then looked up at the monitor and noticed that the patient's heart rate was steadily dropping. Nurse called code, restraints were removed, and compressions were started approximately 2240-2250. Restraints were on no longer than five minutes. Patient recovered, but then two more codes followed. McQuaid, MD was present on the floor and instructed the codes. Family was called to come in. Family (grandmother and brother) arrived and then made patient DNR. Patient soon expired with family at the bedside. Time of death was 0210 on 05/18/2017 (verified by [place]Brittany, Chartered loss adjusterN and [employer], Charity fundraiserN). Patient asystole and no breath sounds present. Breath sounds also verified by GrenadaBrittany, Chartered loss adjusterN and [employer], Charity fundraiserN. E-link (Deterding, MD) notified of expiration. Death certificate will be signed by Henrene PastorMcQuiad, MD. Cardiac strip saved by tele. [place] donor called within the hour. Family is not sure of which funeral home will cremate patient and will update nursing staff later this morning. Patient's belongings are at the nurse's station and brother will pick up items later this morning. All lines removed from patient before being transferred to the morgue.

## 2017-05-22 NOTE — Telephone Encounter (Signed)
On 05/22/2017 I received a death certificate from [facility] (original). The death certificate is for cremation. The patient is a patient of Doctor McQuaid. [facility] this pm for signature.  On 05/22/2017 I received the death certificate back from Doctor McQuaid. I got the death certificate ready and called the funeral home to let them know the d/c is ready for pickup. I also faxed a copy to the funeral home per the funeral home request.

## 2017-05-22 NOTE — Accreditation Note (Signed)
o Restraints not reported to CMS Pursuant to regulation 482.13 (G) (3) use of soft wrist restraints was logged on 05.18.18 at 0715 by Rosine DoorAngus Twilia Yaklin, RN, Patient Safety and Accreditation

## 2017-05-22 NOTE — Progress Notes (Signed)
Pharmacy Antibiotic Note  David Doyle is a 46 y.o. male admitted on Apr 16, 2017 with sepsis.  Pharmacy has been consulted for meropenem and vancomycin dosing.  Plan: Meropenem 2 Gm IV x1 then 1 gm IV q12h Vancomycin 1500 mg x1 then 1250 mg IV q24h VT=15-20 mg/L F/u scr/cultures/levels  Height: 5\' 11"  (180.3 cm) Weight: 190 lb (86.2 kg) IBW/kg (Calculated) : 75.3  Temp (24hrs), Avg:97.5 F (36.4 C), Min:97.2 F (36.2 C), Max:97.8 F (36.6 C)   Recent Labs Lab 05/03/17 1106 11/19/17 0718 11/19/17 0825 11/19/17 1417 11/19/17 1749 11/19/17 2330  WBC 6.1  --  22.4*  --  12.5* 10.3  CREATININE 0.66 0.78  --  1.35* 1.92* 2.92*  LATICACIDVEN  --   --   --   --  6.9* 16.1*    Estimated Creatinine Clearance: 34 mL/min (A) (by C-G formula based on SCr of 2.92 mg/dL (H)).    No Known Allergies  Antimicrobials this admission: 5/18 meropenem >>  5/18 vancomycin >>   Dose adjustments this admission:   Microbiology results:  BCx:   UCx:    Sputum:    MRSA PCR:   Thank you for allowing pharmacy to be a part of this patient's care.  David Doyle, David Doyle 05/19/2017 1:01 AM

## 2017-05-22 NOTE — Progress Notes (Signed)
CRITICAL VALUE ALERT  Critical value received: Troponin 0.12  Date of notification: 5/18  Time of notification: 0015  Critical value read back: yes  Nurse who received alert:  Lowanda FosterBrittany, RN  MD notified (1st page):  Henrene PastorMcQuiad, MD present on floor  Time of first page:  MD present on floor  MD notified (2nd page):  Time of second page:  Responding MD:  Kendrick FriesMcQuaid, MD  Time MD responded: [phone]

## 2017-05-22 NOTE — Procedures (Signed)
Arterial Line Insertion Procedure Note David Doyle [id number] 1971-11-19  Procedure: Insertion of Arterial Line Indications: BP monitoring  Procedure Details Consent: Unable to obtain consent because of emergent medical necessity. Time Out: Verified patient identification, verified procedure, site/side was marked, verified correct patient position, special equipment/implants available, medications/allergies/relevent history reviewed, required imaging and test results available.  Performed  Maximum sterile technique was used including antiseptics, cap, gloves, gown, hand hygiene, mask and sheet. Skin prep: Chlorhexidine; local anesthetic administered A single lumen arterial catheter was placed in the R femoral arterial vein using the Seldinger technique.  Ultrasound was used to verify the patency of the vein and for real time needle guidance.  Evaluation Blood flow good Complications: No apparent complications Patient did tolerate procedure well.  David Doyle 05/07/2017, 2:22 AM

## 2017-05-22 NOTE — Progress Notes (Addendum)
CRITICAL VALUE ALERT  Critical value received: Lactic Acid  Date of notification: 5/18  Time of notification:  0045  Critical value read back: yes  Nurse who received alert:  Lowanda FosterBrittany, RN  MD notified (1st page):  Kendrick FriesMcQuaid, MD  Time of first page: MD present on floor  MD notified (2nd page):  Time of second page:  Responding MD: Kendrick FriesMcQuaid, MD  Time MD responded: [phone]

## 2017-05-22 NOTE — Discharge Summary (Signed)
[facility] Death Note  Date of admission: 08/19/2017 Date of death: 05/13/2017  Cause of death: 1) Acute pancreatitis 2) Alcohol abuse  BRIEF HPI AND HOSPITAL COURSE: 46 y/o male with a past medical history significant for Bipolar disorder, alcohol abuse and PTSD was admitted with acute abdominal pain and alcohol withdrawal on 5/17.  He was found to have findings on labs and CT abdomen worrisome for acute pancreatitis.  He was admitted to the ICU for further management.  He developed worsening acidosis and hyperglycemia despite treatment with IVF and IV insulin. Despite these interventions he had a cardiac arrest requiring CPR and mechanical ventilation.  We treated him with IV bicarbonate, IV antibiotics empirically, and vasopressors and IVF.  Despite these interventions his blood pressure continued to drop and his acidosis remained severe.  Conversations were held with the family and we changed his code status to DNR.  He died with his mother and brother at the bedside.  Heber CarolinaBrent McQuaid, MD [facility] Pager: [phone] After 3pm or if no response, call [phone]

## 2017-05-22 NOTE — Progress Notes (Signed)
[facility]  I have been at the patient's bedside since 2245 this evening.  He has undergone CPR for cardiac arrest (PEA) twice since the initial 16 minute cardiac arrest.  I inserted an arterial catheter which showed profound shock.  Despite aggressive resuscitation with volume depletion, vasopressor administration, and replacement of electrolytes and mechanical ventilatory support his condition has continued to decline.  His family came to the bedside at my request.    I have informed them that at this point there is nothing left we have to offer that will be medically beneficial.  They voiced understanding.    I have instructed the nurses to not refill his vasopressors as they run out (he is currently on maximum doses and his BP is 50/20).  His code status is DNR.  Additional CC time 1 hour  Heber CarolinaBrent Jayleon Mcfarlane, MD [facility] [facility] Pager: [phone] Cell: [phone] After 3pm or if no response, call [phone]

## 2017-05-22 DEATH — deceased

## 2018-02-25 IMAGING — CR DG ABDOMEN ACUTE W/ 1V CHEST
4 series · 4 of 4 positions shown · non-contrast
Comparison: Chest radiograph May 03, 2017; CT abdomen and pelvis
March 12, 2017

CLINICAL DATA: Abdominal pain and vomiting

EXAM:
DG ABDOMEN ACUTE W/ 1V CHEST

[w chest pa]
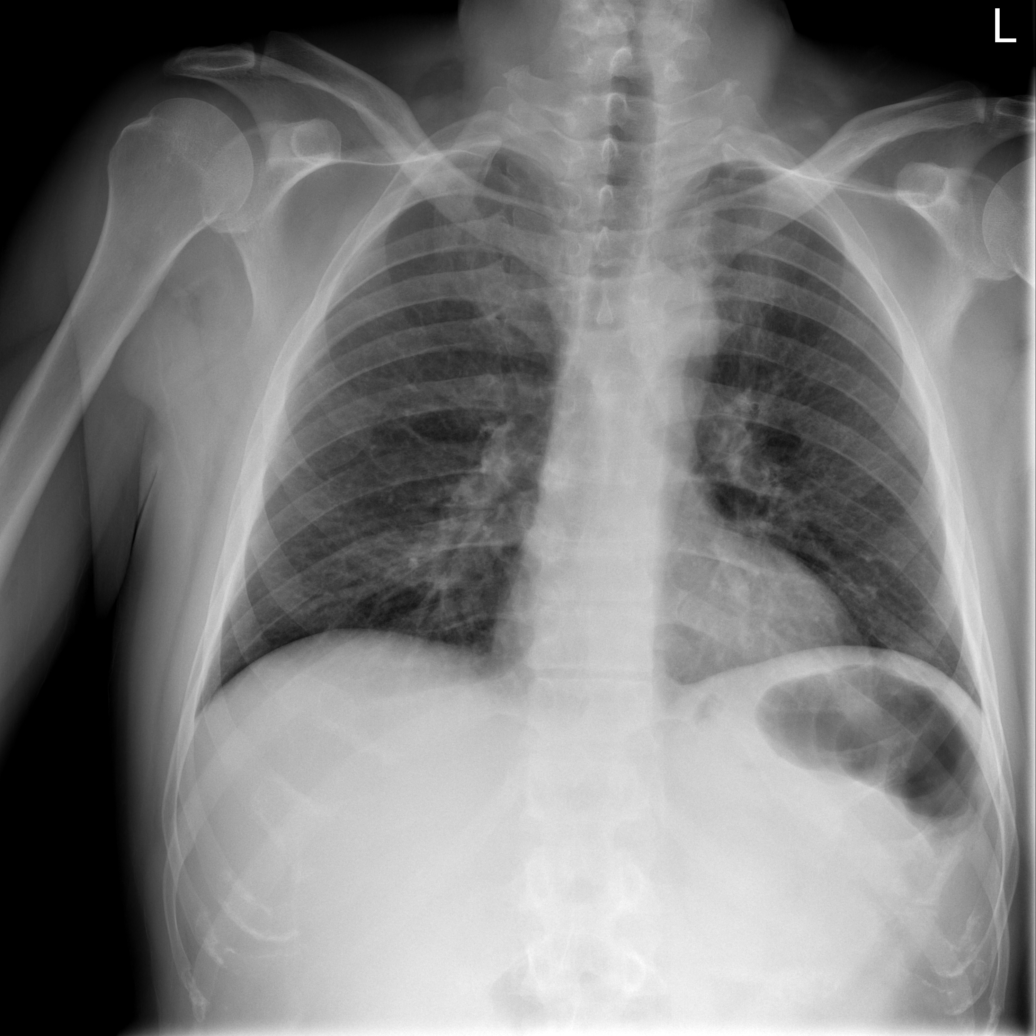

[w abdomen upright]
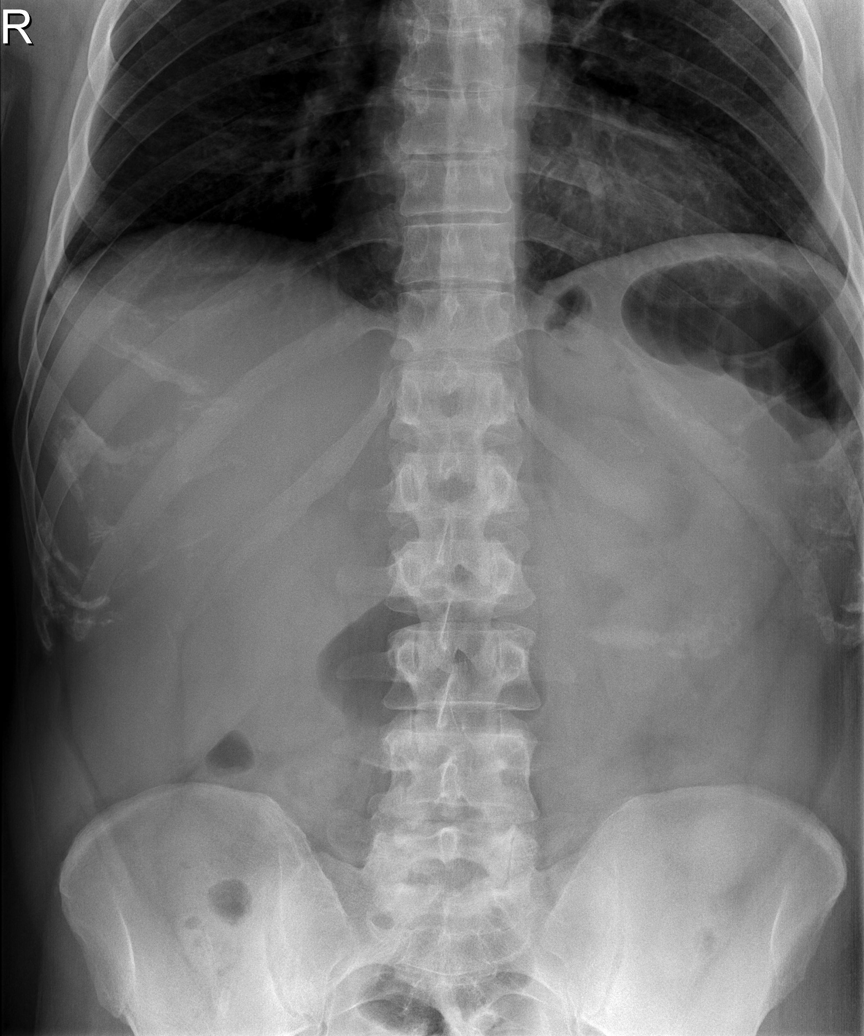

[t abdomen supine (1 of 2)]
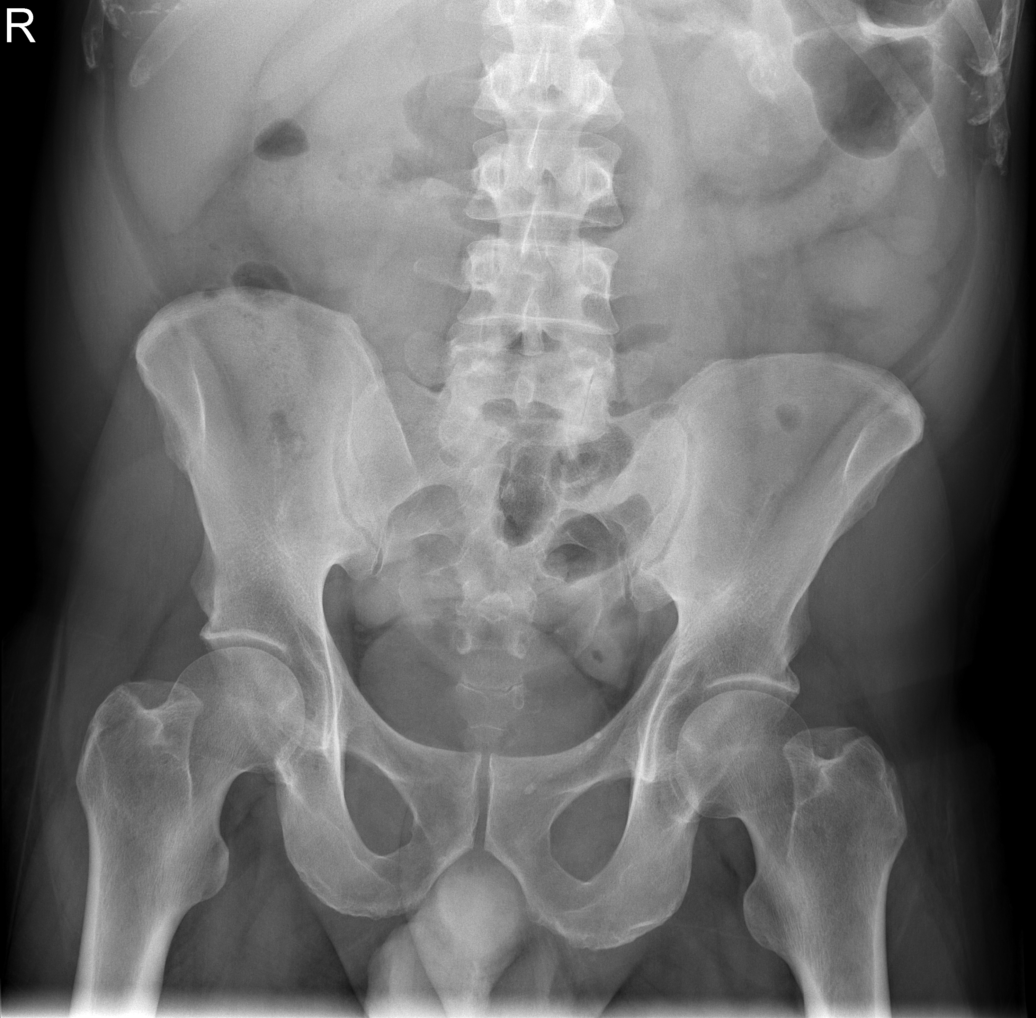

[t abdomen supine (2 of 2)]
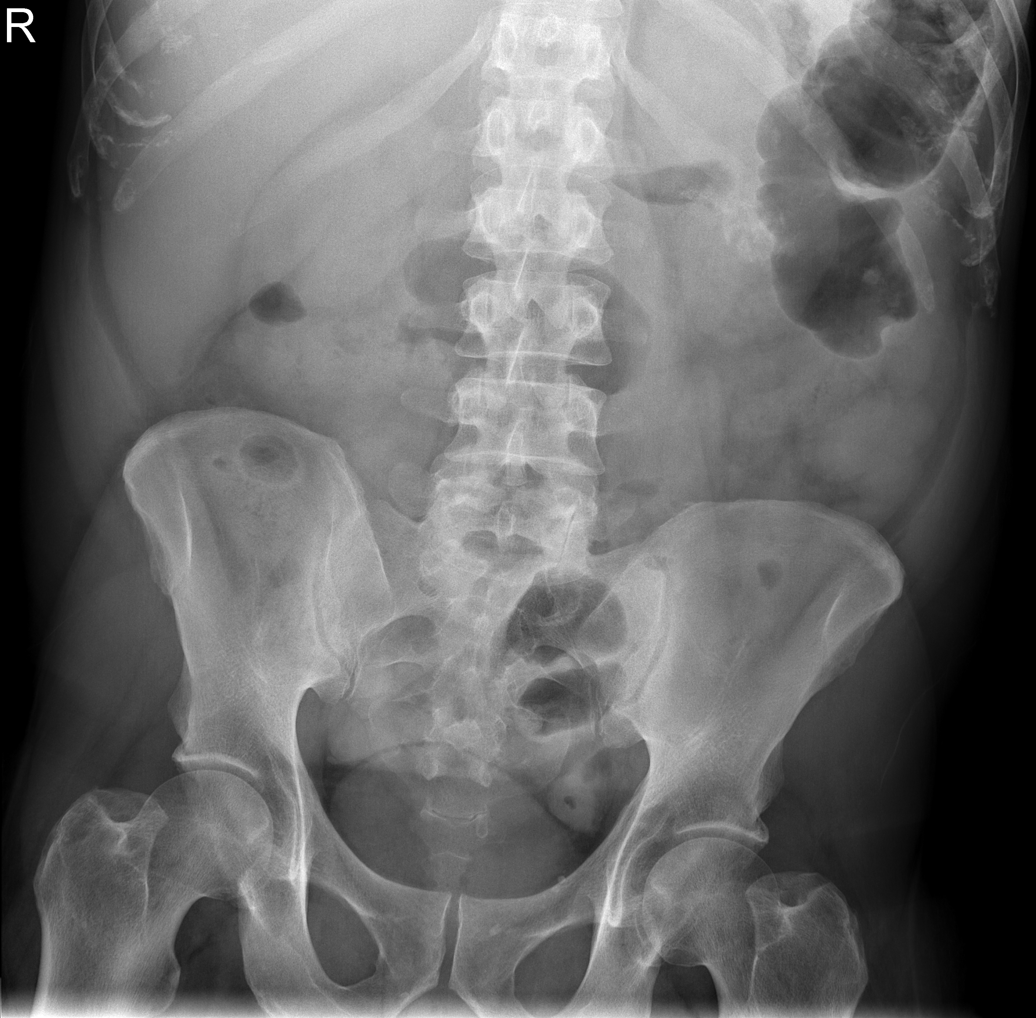

[4 of 4 positions shown; findings below may reference images not displayed]

FINDINGS: PA chest: Lungs are clear. Heart size and pulmonary vascularity are
normal. No adenopathy.

Supine and upright abdomen: There is moderate stool in colon. There
is no appreciable bowel dilatation or air-fluid level to suggest
obstruction. No free air. Small calcifications the pelvis are felt
to be consistent with phleboliths. There is somewhat a amorphous
calcification in the left mid abdomen of uncertain etiology.
IMPRESSION: Somewhat amorphous calcification left mid abdomen of uncertain
etiology. No bowel dilatation or air-fluid levels suggesting bowel
obstruction. No free air. Lungs clear.

## 2018-02-25 IMAGING — DX DG CHEST 1V PORT
1 series · 1 of 1 positions shown · non-contrast
Comparison: None.

CLINICAL DATA: Cardiac arrest.  Intubation.

EXAM:
PORTABLE CHEST 1 VIEW

[chest ap]
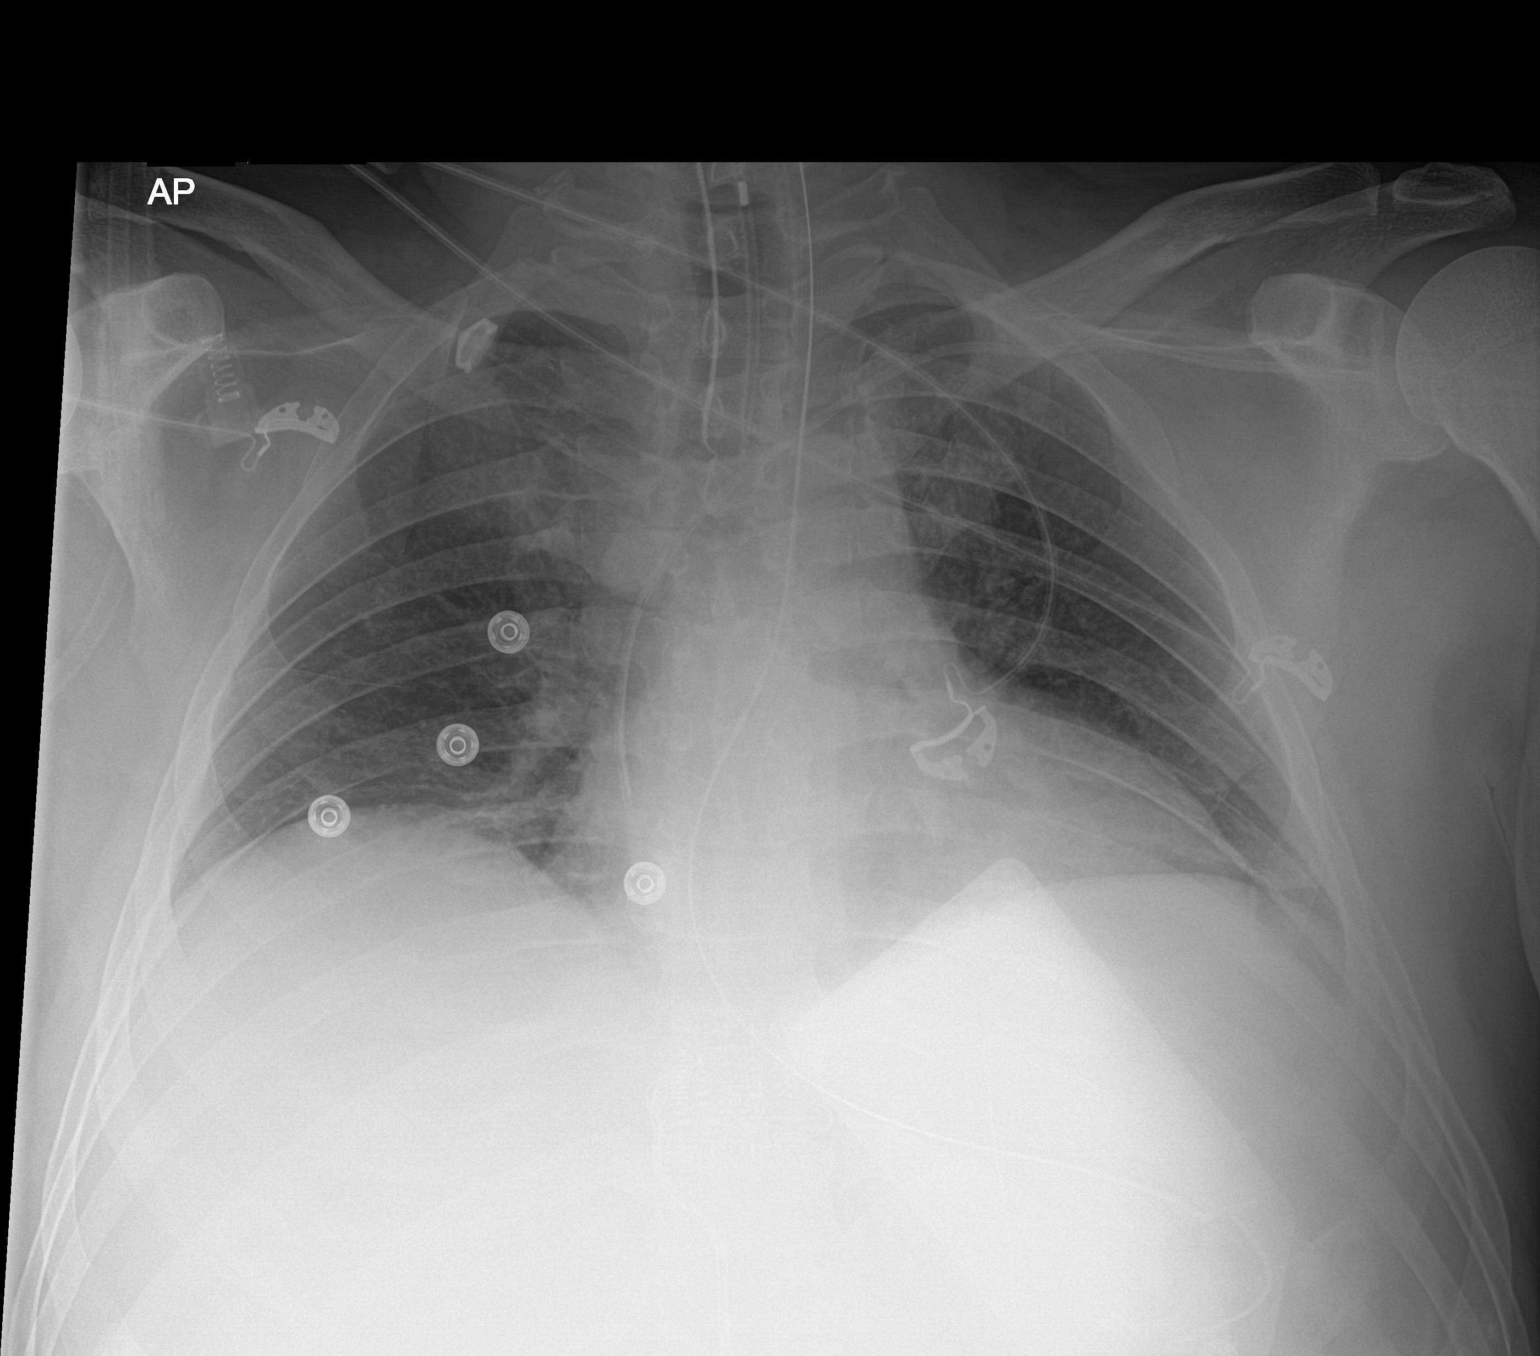

[1 of 1 positions shown; findings below may reference images not displayed]

FINDINGS: Endotracheal tube tip is in appropriate position at the level of the
clavicles. Orogastric tube courses below the diaphragm and overlies
stomach. There is a left subclavian approach central venous catheter
with tip in the right atrium.

Shallow lung inflation without focal consolidation or overt
pulmonary edema. Mild cardiomegaly. No pleural effusion or
pneumothorax.
IMPRESSION: 1. Appropriate position of endotracheal tube.
2. No focal airspace consolidation or pulmonary edema.

## 2018-02-25 IMAGING — CT CT ABD-PELV W/O CM
2 of 4 series · 16 of 46 positions shown, 18 images · non-contrast
Comparison: 03/12/2017

CLINICAL DATA: ACUTE PANCREATITISPATIENT DIAPHORETIC AND AMSPMH:
DM, HTN, GERD ; Pancreatitis; Depression; Suicidal behavior;
Substance abuse; PTSD ; Bipolar 1 disorder

EXAM:
CT ABDOMEN AND PELVIS WITHOUT CONTRAST
TECHNIQUE: Multidetector CT imaging of the abdomen and pelvis was performed
following the standard protocol without IV contrast.

[Series 2: axial st · axial · 0.89mm/px · z∈[-476,-21]mm · 13 of 103 slices shown, 15 images]
[im 6/103  soft-tissue]
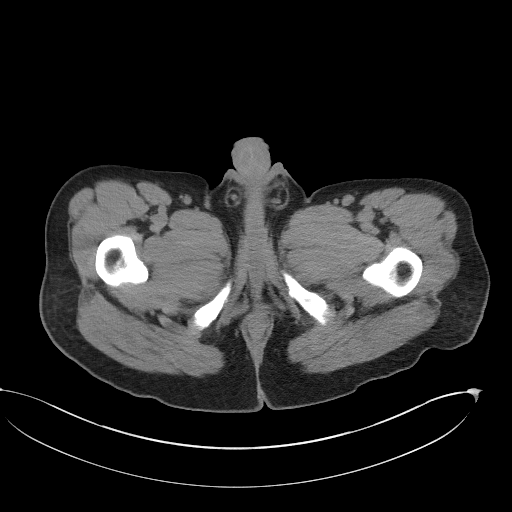
[im 6/103  bone]
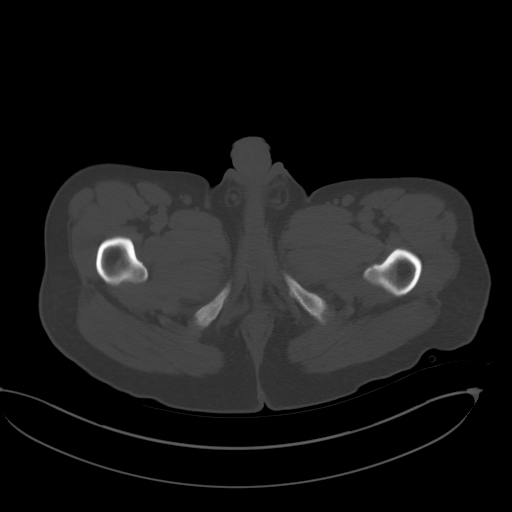
[im 17/103  soft-tissue]
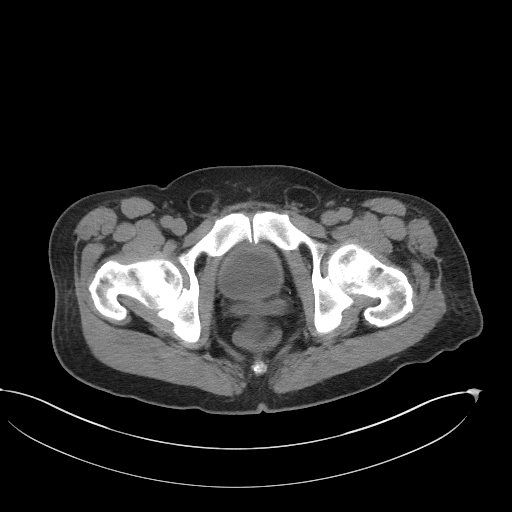
[im 22/103  soft-tissue]
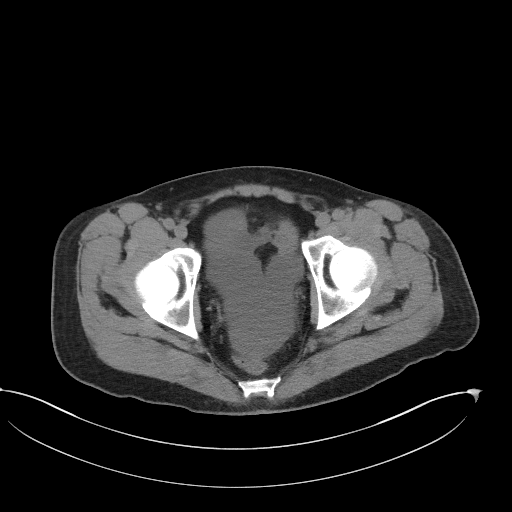
[im 27/103  soft-tissue]
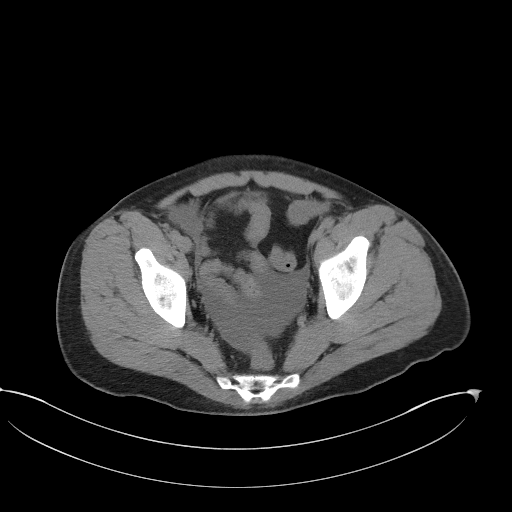
[im 38/103  soft-tissue]
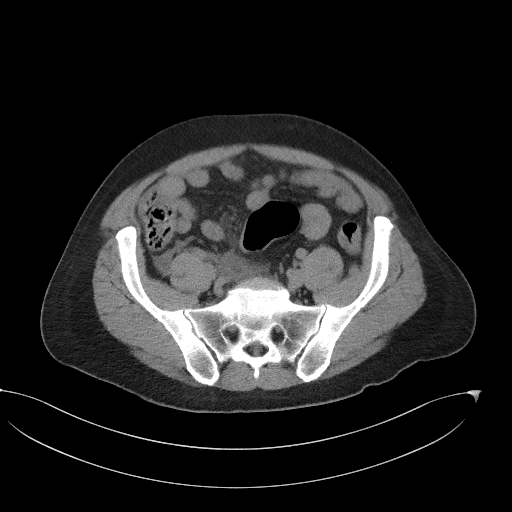
[im 43/103  soft-tissue]
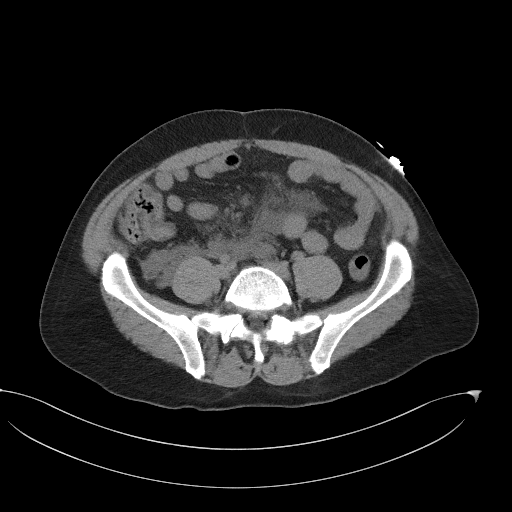
[im 54/103  soft-tissue]
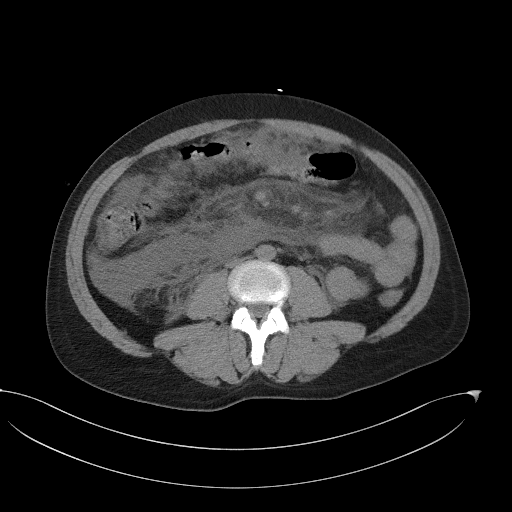
[im 60/103  soft-tissue]
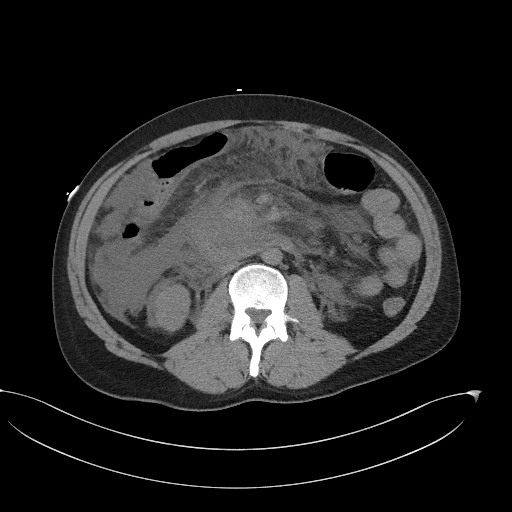
[im 65/103  soft-tissue]
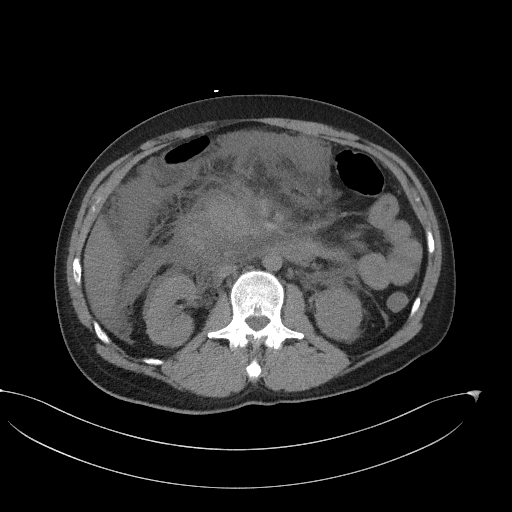
[im 65/103  bone]
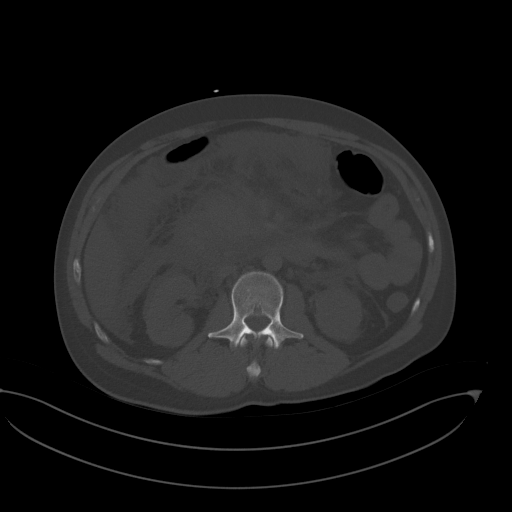
[im 76/103  soft-tissue]
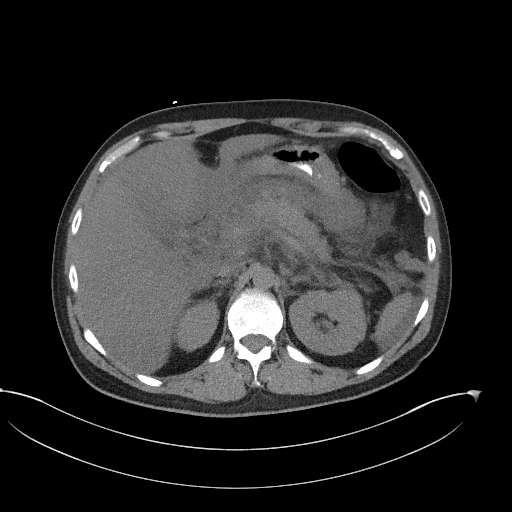
[im 81/103  soft-tissue]
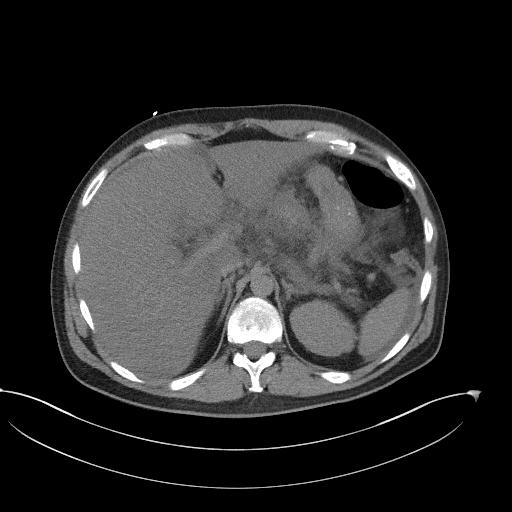
[im 86/103  soft-tissue]
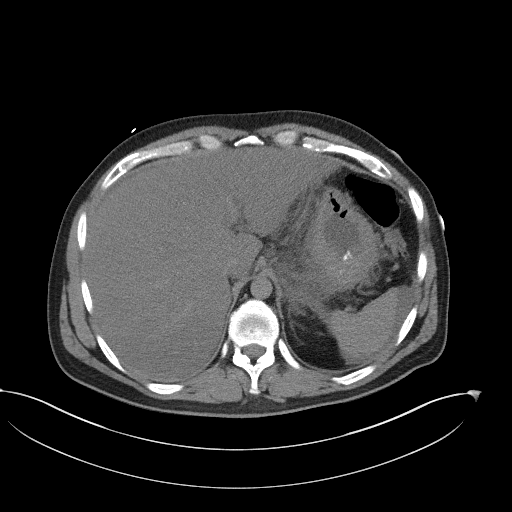
[im 97/103  soft-tissue]
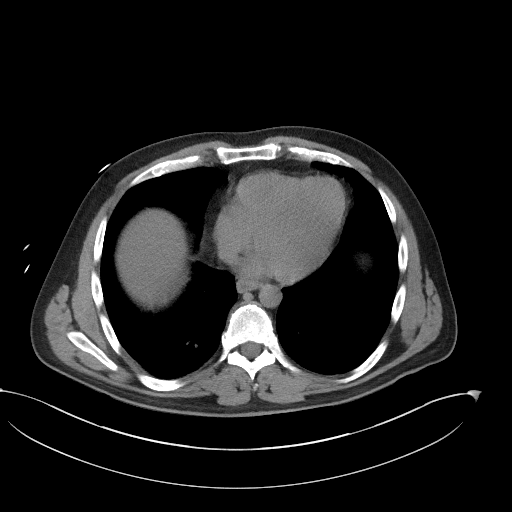

[Series 5: coronal st · coronal · 0.83mm/px · 3 of 101 slices shown]
[im 34/101  soft-tissue]
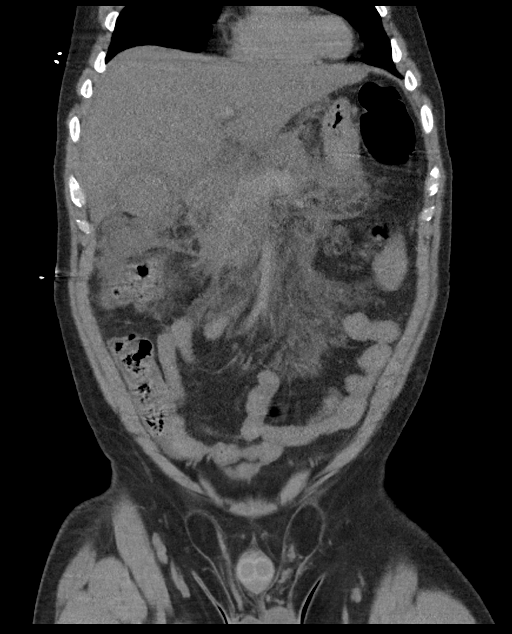
[im 45/101  soft-tissue]
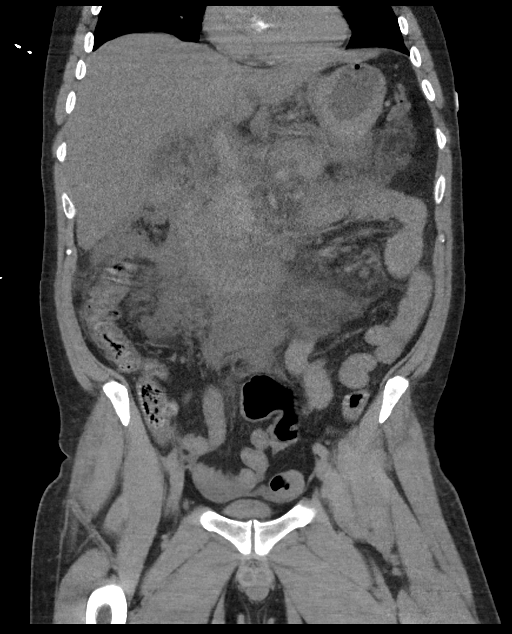
[im 56/101  soft-tissue]
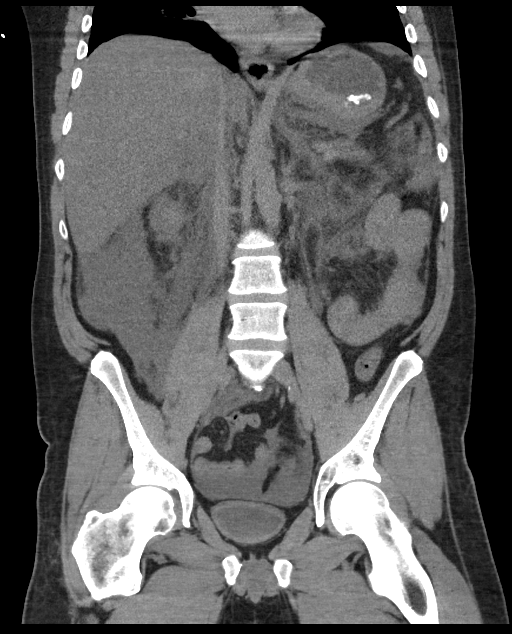

[16 of 46 positions shown; findings below may reference images not displayed]

FINDINGS: Lower chest: Clear lung bases.  Heart normal in size.

Hepatobiliary: Liver shows decreased attenuation diffusely. It is
borderline enlarged. Gallbladder is not well-defined but is grossly
unremarkable. No bile duct dilation.

Pancreas: Pancreas shows relative increased attenuation with a
poorly defined margin. There is significant surrounding inflammatory
change and fluid in, but no discrete formed fluid collection is seen
to suggest an abscess or pseudocyst.

Spleen: Normal in size without focal abnormality.

Adrenals/Urinary Tract: Adrenal glands are unremarkable. Kidneys are
normal, without renal calculi, focal lesion, or hydronephrosis.
Bladder is unremarkable.

Stomach/Bowel: Stomach is within normal limits. Appendix appears
normal. No evidence of bowel wall thickening, distention, or
inflammatory changes.

Vascular/Lymphatic: No significant vascular findings are present. No
enlarged abdominal or pelvic lymph nodes.

Reproductive: Prostate is unremarkable.

Other: The peripancreatic inflammatory change extends into the mid
to upper abdominal mesenteric. There is also significant
inflammatory change along the greater omentum. Ascites extends along
the height anterior pararenal fascia into the pelvis.

Musculoskeletal: No fracture or acute finding. No osteoblastic or
osteolytic lesions.
IMPRESSION: 1. Extensive inflammatory changes of acute pancreatitis. No formed
fluid collection is seen to suggest an abscess or pseudocyst.
Inflammation seen throughout the peripancreatic fat, mesenteries and
along the greater omentum. Ascites is predominantly seen adjacent to
the liver and spleen and tracking along the anterior pararenal
fascia on the right into the pelvis.
2. Pancreatic necrosis and venous thrombosis cannot be assessed due
to lack of intravenous contrast.
3. Borderline hepatomegaly and extensive hepatic steatosis.
